# Patient Record
Sex: Female | Born: 1935 | Race: White | Hispanic: No | Marital: Married | Smoking: Former smoker
Health system: Southern US, Community
[De-identification: ages and names within clinical notes are randomized; demographics above are authoritative.]

## PROBLEM LIST (undated history)

## (undated) DIAGNOSIS — E11319 Type 2 diabetes mellitus with unspecified diabetic retinopathy without macular edema: Secondary | ICD-10-CM

## (undated) DIAGNOSIS — K219 Gastro-esophageal reflux disease without esophagitis: Secondary | ICD-10-CM

## (undated) DIAGNOSIS — K649 Unspecified hemorrhoids: Secondary | ICD-10-CM

## (undated) DIAGNOSIS — R4182 Altered mental status, unspecified: Secondary | ICD-10-CM

## (undated) DIAGNOSIS — I619 Nontraumatic intracerebral hemorrhage, unspecified: Secondary | ICD-10-CM

## (undated) DIAGNOSIS — M6281 Muscle weakness (generalized): Secondary | ICD-10-CM

## (undated) DIAGNOSIS — E119 Type 2 diabetes mellitus without complications: Secondary | ICD-10-CM

## (undated) DIAGNOSIS — R55 Syncope and collapse: Secondary | ICD-10-CM

## (undated) DIAGNOSIS — F0390 Unspecified dementia without behavioral disturbance: Secondary | ICD-10-CM

## (undated) DIAGNOSIS — G9341 Metabolic encephalopathy: Secondary | ICD-10-CM

## (undated) DIAGNOSIS — I10 Essential (primary) hypertension: Secondary | ICD-10-CM

## (undated) DIAGNOSIS — F329 Major depressive disorder, single episode, unspecified: Secondary | ICD-10-CM

## (undated) DIAGNOSIS — R410 Disorientation, unspecified: Secondary | ICD-10-CM

## (undated) DIAGNOSIS — I2510 Atherosclerotic heart disease of native coronary artery without angina pectoris: Secondary | ICD-10-CM

## (undated) DIAGNOSIS — I639 Cerebral infarction, unspecified: Secondary | ICD-10-CM

## (undated) DIAGNOSIS — H269 Unspecified cataract: Secondary | ICD-10-CM

## (undated) DIAGNOSIS — K5792 Diverticulitis of intestine, part unspecified, without perforation or abscess without bleeding: Secondary | ICD-10-CM

## (undated) DIAGNOSIS — E785 Hyperlipidemia, unspecified: Secondary | ICD-10-CM

## (undated) HISTORY — DX: Altered mental status, unspecified: R41.82

## (undated) HISTORY — DX: Unspecified dementia without behavioral disturbance: F03.90

## (undated) HISTORY — DX: Atherosclerotic heart disease of native coronary artery without angina pectoris: I25.10

## (undated) HISTORY — DX: Essential (primary) hypertension: I10

## (undated) HISTORY — PX: BREAST CYST EXCISION: SHX579

## (undated) HISTORY — DX: Gastro-esophageal reflux disease without esophagitis: K21.9

## (undated) HISTORY — DX: Type 2 diabetes mellitus without complications: E11.9

## (undated) HISTORY — DX: Muscle weakness (generalized): M62.81

## (undated) HISTORY — DX: Syncope and collapse: R55

## (undated) HISTORY — DX: Cerebral infarction, unspecified: I63.9

## (undated) HISTORY — DX: Unspecified hemorrhoids: K64.9

## (undated) HISTORY — DX: Nontraumatic intracerebral hemorrhage, unspecified: I61.9

## (undated) HISTORY — DX: Type 2 diabetes mellitus with unspecified diabetic retinopathy without macular edema: E11.319

## (undated) HISTORY — DX: Unspecified cataract: H26.9

## (undated) HISTORY — PX: CARDIAC CATHETERIZATION: SHX172

## (undated) HISTORY — DX: Unspecified dementia, unspecified severity, without behavioral disturbance, psychotic disturbance, mood disturbance, and anxiety: F03.90

## (undated) HISTORY — DX: Diverticulitis of intestine, part unspecified, without perforation or abscess without bleeding: K57.92

## (undated) HISTORY — DX: Major depressive disorder, single episode, unspecified: F32.9

## (undated) HISTORY — DX: Metabolic encephalopathy: G93.41

## (undated) HISTORY — DX: Disorientation, unspecified: R41.0

---

## 2003-06-08 ENCOUNTER — Encounter: Payer: Self-pay | Admitting: Family Medicine

## 2003-06-08 ENCOUNTER — Ambulatory Visit (HOSPITAL_COMMUNITY): Admission: RE | Admit: 2003-06-08 | Discharge: 2003-06-08 | Payer: Self-pay | Admitting: Family Medicine

## 2004-03-08 ENCOUNTER — Ambulatory Visit (HOSPITAL_COMMUNITY): Admission: RE | Admit: 2004-03-08 | Discharge: 2004-03-08 | Payer: Self-pay | Admitting: Internal Medicine

## 2005-01-23 ENCOUNTER — Ambulatory Visit (HOSPITAL_COMMUNITY): Admission: RE | Admit: 2005-01-23 | Discharge: 2005-01-23 | Payer: Self-pay | Admitting: Family Medicine

## 2005-10-09 ENCOUNTER — Inpatient Hospital Stay (HOSPITAL_COMMUNITY): Admission: EM | Admit: 2005-10-09 | Discharge: 2005-10-10 | Payer: Self-pay | Admitting: Emergency Medicine

## 2005-10-09 ENCOUNTER — Ambulatory Visit: Payer: Self-pay | Admitting: *Deleted

## 2010-01-11 ENCOUNTER — Ambulatory Visit (HOSPITAL_COMMUNITY): Admission: RE | Admit: 2010-01-11 | Discharge: 2010-01-11 | Payer: Self-pay | Admitting: Family Medicine

## 2010-11-05 ENCOUNTER — Encounter: Payer: Self-pay | Admitting: Cardiology

## 2010-11-05 ENCOUNTER — Encounter (INDEPENDENT_AMBULATORY_CARE_PROVIDER_SITE_OTHER): Payer: Self-pay | Admitting: *Deleted

## 2010-11-05 ENCOUNTER — Ambulatory Visit (INDEPENDENT_AMBULATORY_CARE_PROVIDER_SITE_OTHER): Payer: Medicare Other | Admitting: Cardiology

## 2010-11-05 DIAGNOSIS — R079 Chest pain, unspecified: Secondary | ICD-10-CM

## 2010-11-05 DIAGNOSIS — E782 Mixed hyperlipidemia: Secondary | ICD-10-CM

## 2010-11-05 DIAGNOSIS — I10 Essential (primary) hypertension: Secondary | ICD-10-CM

## 2010-11-05 DIAGNOSIS — K219 Gastro-esophageal reflux disease without esophagitis: Secondary | ICD-10-CM | POA: Insufficient documentation

## 2010-11-06 ENCOUNTER — Encounter: Payer: Self-pay | Admitting: Cardiology

## 2010-11-09 ENCOUNTER — Other Ambulatory Visit: Payer: Self-pay | Admitting: Cardiology

## 2010-11-13 NOTE — Assessment & Plan Note (Signed)
Summary: **NP6 CP Gerda Diss [phone]   Visit Type:  Initial Consult Primary Provider:  Dr.Stephen Gerda Diss   History of Present Illness: 77 year old woman referred for cardiology consultation. She reports a history of moderate chest pain suggestive of angina, specifically related to walking at a brisk pace, noted intermittently since around December 2011. This has not been present recently. She states that otherwise when she has been doing her typical senior aerobics 3 days a week, she has not had these symptoms.  She describes a pressure-like sensation, only noted with exertion, never at rest. No unusual shortness of breath over baseline, no palpitations, nausea, emesis, or syncope. She recalls having somewhat similar symptoms back in 2007 at which time she underwent a cardiac evaluation. Exercise Myoview at that point was reassuring.  She does state that with these symptoms, she has been treated with a proton pump inhibitor with improvement in the past. She was recently placed back on omeprazole.  Cardiac risk factors are noted below.  Current Medications (verified): 1)  Lescol 20 Mg Caps (Fluvastatin Sodium) .... Take 1 Tab Daily 2)  Glucosamine 500 Mg Caps (Glucosamine Sulfate) .... Take 1 Tab Two Times A Day 3)  Citracal Plus  Tabs (Multiple Minerals-Vitamins) .... Take 1 Tab Daily 4)  Centrum  Tabs (Multiple Vitamins-Minerals) .... Take 1 Tab Daily 5)  Vitamin C 500 Mg Tabs (Ascorbic Acid) .... Take 1 Tab Daily 6)  Nasonex 50 Mcg/act Susp (Mometasone Furoate) .... Use Prn 7)  Fish Oil 1000 Mg Caps (Omega-3 Fatty Acids) .... Take 1 Tab Three Times A Day 8)  Co Q-10 150 Mg Caps (Coenzyme Q10) .... Take 1 Tab Daily 9)  Aspir-Low 81 Mg Tbec (Aspirin) .... Take 1 Tab Daily 10)  Calcium 500 Mg Tabs (Calcium) .... Take 1 Tab Two Times A Day 11)  Klor-Con 20 Meq Pack (Potassium Chloride) .... Take 1 Tab Daily 12)  Vitamin D3 3000 Unit Tabs (Cholecalciferol) .... Take Prn 13)  Dyazide 37.5-25 Mg  Caps (Triamterene-Hctz) .... Take 1 Tab Daily 14)  Vitamin B-12 1000 Mcg Tabs (Cyanocobalamin) .... Take 1 Tab Daily 15)  Advil 200 Mg Tabs (Ibuprofen) .... As Needed 16)  Pro-Biotic Blend  Caps (Probiotic Product) .... Take 1 Tab Daily 17)  Omeprazole 20 Mg Cpdr (Omeprazole) .... Take 1 Tab Daily 18)  Flagyl 500 Mg Tabs (Metronidazole) .... Take 1 Tab Three Times A Day  Allergies (verified): No Known Drug Allergies  Comments:  Nurse/Medical Assistant: patient brought meds she uses [facility] in [place]  Past History:  Family History: Last updated: 2010/11/26 Father: CAD in his 69s, died with MI in his 47s Mother: died age 39 with pancreatic cancer Siblings: sister with breast cancer  Social History: Last updated: 11/02/2010 Retired  Tobacco Use - No.  Alcohol Use - no Regular Exercise - no Drug Use - no  Past Medical History: Hypertension Diabetes Type 2 G E R D Cataracts Diverticulitis Allergic rhinitis  Past Surgical History: Breast cyst removed  Family History: Father: CAD in his 15s, died with MI in his 59s Mother: died age 44 with pancreatic cancer Siblings: sister with breast cancer  Review of Systems       The patient complains of chest pain.  The patient denies anorexia, fever, weight gain, syncope, dyspnea on exertion, peripheral edema, prolonged cough, hemoptysis, melena, hematochezia, and severe indigestion/heartburn.         Otherwise reviewed and negative except as outlined.  Vital Signs:  Patient profile:   77 year  old female Height:      63 inches Weight:      154 pounds BMI:     27.38 Pulse rate:   66 / minute BP sitting:   142 / 82  (left arm)  Vitals Entered By: Dreama Saa, CNA (November 05, 2010 2:53 PM)  Physical Exam  Additional Exam:  Normally nourished appearing elderly woman in no acute distress. HEENT: Conjunctiva and lids are normal, oropharynx with moist mucosa. Neck: Supple, no elevated JVP or bruits, or  thyromegaly. Lungs: Clear to auscultation, nonlabored. Cardiac: Regular rate and rhythm, no S3 or pericardial rub. Abdomen: Soft, nontender, bowel sounds present. Skin: Warm and dry. Musculoskeletal: No kyphosis. Extremities: No pitting edema. Neuropsychiatric: Alert and oriented x3, affect appropriate.   Nuclear Study  Procedure date:  10/10/2005  Findings:       RESULTS:  There is uniform perfusion throughout all myocardial   segments.  There is no evidence of ischemia or scar.  The overall   ejection fraction is 83%.  There are no wall motion abnormalities   seen.   STRESS TEST:  The patient exercised 4 minutes and 31 seconds of a   Bruce protocol attaining 6.5 mets of exercise.  Her heart rate   increased from 83 beats a minute to 150 beats a minute which is 100%   of her max predicted heart rate for her age.  Her blood pressure went   from 138/78 to 182/78.  During that time, she had shortness of breath   which resolved in recovery with no chest discomfort.  She had no   ischemic ST-T wave changes or arrhythmias seen and her baseline   electrocardiogram was interpretable.   IMPRESSION:   This is a low risk scan in a patient with no known coronary artery   disease.  Clinical correlation is advised.  EKG  Procedure date:  11/05/2010  Findings:      Sinus rhythm at 64 beats per minute.  Impression & Recommendations:  Problem # 1:  CHEST PAIN UNSPECIFIED (ICD-786.50)  Features concerning for angina as noted above. Cardiac risk factors include hypertension, type 2 diabetes mellitus, some family history. With similar symptoms in the past however, she underwent reassuring evaluation in 2007 it was ultimately felt to be more consistent with GERD. Resting ECG is normal. Plan at this point is to follow up with an exercise Myoview on medical therapy. Will then have her return to the office to discuss the results.  Her updated medication list for this problem includes:     Aspir-low 81 Mg Tbec (Aspirin) .Marland Kitchen... Take 1 tab daily  Orders: Nuclear Stress Test (Nuc Stress Test)  Problem # 2:  GERD (ICD-530.81)  Recently back on omeprazole.  Her updated medication list for this problem includes:    Omeprazole 20 Mg Cpdr (Omeprazole) .Marland Kitchen... Take 1 tab daily  Problem # 3:  ESSENTIAL HYPERTENSION, BENIGN (ICD-401.1)  Blood pressure elevated today. She reports compliance with her medications.  Her updated medication list for this problem includes:    Aspir-low 81 Mg Tbec (Aspirin) .Marland Kitchen... Take 1 tab daily    Dyazide 37.5-25 Mg Caps (Triamterene-hctz) .Marland Kitchen... Take 1 tab daily  Problem # 4:  MIXED HYPERLIPIDEMIA (ICD-272.2)  Followed by Dr. Gerda Diss.  Her updated medication list for this problem includes:    Lescol 20 Mg Caps (Fluvastatin sodium) .Marland Kitchen... Take 1 tab daily  Patient Instructions: 1)  Your physician recommends that you schedule a follow-up appointment in: 3-4 weeks  2)  Your physician has requested that you have an exercise stress myoview.  For further information please visit [email or web address]  Please follow instruction sheet, as given.

## 2010-11-13 NOTE — Letter (Signed)
Summary: [facility]  [facility]   Imported By: Faythe Ghee 11/06/2010 10:56:48  _____________________________________________________________________  External Attachment:    Type:   Image     Comment:   External Document

## 2010-11-13 NOTE — Letter (Signed)
Summary: [facility] (Nuc Med Stress)  [facility]  [address], [place] [address]   Phone: [phone]  Fax: [phone]    Nuclear Medicine 1-Day Stress Test Information Sheet  Re:     [name]   DOB:     March 01, 1934 MRN:     [id number] Weight:  Appointment Date: Register at: Appointment Time: Referring MD:  _x__Exercise Stress  __Adenosine   __Dobutamine  __Lexiscan  __Persantine   __Thallium  Urgency: ____1 (next day)   ____2 (one week)    ____3 (PRN)  Patient will receive Follow Up call with results: Patient needs follow-up appointment:  Instructions regarding medication:  How to prepare for your stress test: 1. DO NOT eat or dring 8 hours prior to your arrival time. This includes no caffeine (coffee, tea, sodas, chocolate) if you were instructed to take your medications, drink water with it. 2. DO NOT use any tobacco products for at leaset 8 hours prior to arrival. 3. DO NOT wear dresses or any clothing that may have metal clasps or buttons. 4. Wear short sleeve shirts, loose clothing, and comfortalbe walking shoes. 5. DO NOT use lotions, oils or powder on your chest before the test. 6. The test will take approximately 3-4 hours from the time you arrive until completion. 7. To register the day of the test, go to the Short Stay entrance at [facility]. 8. If you must cancel your test, call [phone] as soon as you are aware. 9.  DO NOT TAKE YOUR AM MEDICATION THE MORNING OF YOUR STRESS TEST After you arrive for test:   When you arrive at [facility] you will go to Short Stay to be registered. They will then send you to Radiology to check in. The Nuclear Medicine Tech will get you and start an IV in your arm or hand. A small amount of a radioactive tracer will then be injected into your IV. This tracer will then have to circulate for 30-45 minutes. During this time you will wait in the waiting room and you will be able  to drink something without caffeine. A series of pictures will be taken of your heart follwoing this waiting period. After the 1st set of pictures you will go to the stress lab to get ready for your stress test. During the stress test, another small amount of a radioactive tracer will be injected through your IV. When the stress test is complete, there is a short rest period while your heart rate and blood pressure will be monitored. When this monitoring period is complete you will have another set of pictrues taken. (The same as the 1st set of pictures). These pictures are taken between 15 minutes and 1 hour after the stress test. The time depends on the type of stress test you had. Your doctor will inform you of your test results within 7 days after test.    The possibilities of certain changes are possible during the test. They include abnormal blood pressure and disorders of the heart. Side effects of persantine or adenosine can include flushing, chest pain, shortness of breath, stomach tightness, headache and light-headedness. These side effects usually do not last long and are self-resolving. Every effort will be made to keep you comfortable and to minimize complications by obtaining a medical history and by close observation during the test. Emergency equipment, medications, and trained personnel are available to deal with any unusual situation which may arise.  Please notify  office at least 48 hours in advance if you are unable to keep this appt.

## 2010-11-15 ENCOUNTER — Encounter (HOSPITAL_COMMUNITY): Payer: Medicare Other

## 2010-11-15 ENCOUNTER — Ambulatory Visit (HOSPITAL_COMMUNITY)
Admission: RE | Admit: 2010-11-15 | Discharge: 2010-11-15 | Disposition: A | Discharge status: Home / Self Care | Payer: Medicare Other | Source: Ambulatory Visit | Attending: Cardiology | Admitting: Cardiology

## 2010-11-15 ENCOUNTER — Encounter: Payer: Self-pay | Admitting: Adult Health

## 2010-11-15 ENCOUNTER — Other Ambulatory Visit: Payer: Self-pay | Admitting: Cardiology

## 2010-11-15 ENCOUNTER — Ambulatory Visit (INDEPENDENT_AMBULATORY_CARE_PROVIDER_SITE_OTHER): Payer: Medicare Other | Admitting: Adult Health

## 2010-11-15 ENCOUNTER — Encounter (INDEPENDENT_AMBULATORY_CARE_PROVIDER_SITE_OTHER): Payer: Medicare Other

## 2010-11-15 ENCOUNTER — Encounter (HOSPITAL_COMMUNITY)
Admission: RE | Admit: 2010-11-15 | Discharge: 2010-11-15 | Disposition: A | Discharge status: Home / Self Care | Payer: Medicare Other | Source: Ambulatory Visit | Attending: Cardiology | Admitting: Cardiology

## 2010-11-15 ENCOUNTER — Encounter (HOSPITAL_COMMUNITY): Payer: Self-pay

## 2010-11-15 ENCOUNTER — Encounter: Payer: Self-pay | Admitting: Cardiology

## 2010-11-15 ENCOUNTER — Encounter: Payer: Self-pay | Admitting: *Deleted

## 2010-11-15 DIAGNOSIS — R079 Chest pain, unspecified: Secondary | ICD-10-CM | POA: Insufficient documentation

## 2010-11-15 DIAGNOSIS — E119 Type 2 diabetes mellitus without complications: Secondary | ICD-10-CM | POA: Insufficient documentation

## 2010-11-15 DIAGNOSIS — R0789 Other chest pain: Secondary | ICD-10-CM

## 2010-11-15 DIAGNOSIS — I10 Essential (primary) hypertension: Secondary | ICD-10-CM

## 2010-11-15 DIAGNOSIS — R072 Precordial pain: Secondary | ICD-10-CM

## 2010-11-15 DIAGNOSIS — R9439 Abnormal result of other cardiovascular function study: Secondary | ICD-10-CM

## 2010-11-15 LAB — CONVERTED CEMR LAB
Basophils Relative: 1 % (ref 0–1)
CO2: 28 meq/L (ref 19–32)
Chloride: 99 meq/L (ref 96–112)
Creatinine, Ser: 0.85 mg/dL (ref 0.40–1.20)
Eosinophils Relative: 2 % (ref 0–5)
HCT: 38.7 % (ref 36.0–46.0)
Hemoglobin: 13.6 g/dL (ref 12.0–15.0)
MCHC: 35.1 g/dL (ref 30.0–36.0)
MCV: 85.2 fL (ref 78.0–100.0)
Monocytes Absolute: 0.5 10*3/uL (ref 0.1–1.0)
Monocytes Relative: 10 % (ref 3–12)
Neutro Abs: 3.2 10*3/uL (ref 1.7–7.7)
RBC: 4.54 M/uL (ref 3.87–5.11)
aPTT: 32 s (ref 24–37)

## 2010-11-15 MED ORDER — TECHNETIUM TC 99M TETROFOSMIN IV KIT
10.0000 | PACK | Freq: Once | INTRAVENOUS | Status: AC | PRN
  Administered 2010-11-15: 9.7 via INTRAVENOUS

## 2010-11-15 MED ORDER — TECHNETIUM TC 99M TETROFOSMIN IV KIT
30.0000 | PACK | Freq: Once | INTRAVENOUS | Status: AC | PRN
  Administered 2010-11-15: 32.2 via INTRAVENOUS

## 2010-11-16 ENCOUNTER — Observation Stay (HOSPITAL_COMMUNITY)
Admission: RE | Admit: 2010-11-16 | Discharge: 2010-11-17 | Disposition: A | Discharge status: Home / Self Care | Payer: Medicare Other | Source: Ambulatory Visit | Attending: Cardiology | Admitting: Cardiology

## 2010-11-16 ENCOUNTER — Inpatient Hospital Stay (HOSPITAL_BASED_OUTPATIENT_CLINIC_OR_DEPARTMENT_OTHER)
Admission: RE | Admit: 2010-11-16 | Discharge: 2010-11-16 | Disposition: A | Discharge status: Hospice | Payer: Medicare Other | Source: Ambulatory Visit | Attending: Cardiology | Admitting: Cardiology

## 2010-11-16 DIAGNOSIS — I2510 Atherosclerotic heart disease of native coronary artery without angina pectoris: Secondary | ICD-10-CM

## 2010-11-16 DIAGNOSIS — E119 Type 2 diabetes mellitus without complications: Secondary | ICD-10-CM | POA: Insufficient documentation

## 2010-11-16 DIAGNOSIS — E785 Hyperlipidemia, unspecified: Secondary | ICD-10-CM | POA: Insufficient documentation

## 2010-11-16 DIAGNOSIS — I10 Essential (primary) hypertension: Secondary | ICD-10-CM | POA: Insufficient documentation

## 2010-11-16 DIAGNOSIS — I4729 Other ventricular tachycardia: Secondary | ICD-10-CM | POA: Insufficient documentation

## 2010-11-16 DIAGNOSIS — I472 Ventricular tachycardia, unspecified: Secondary | ICD-10-CM | POA: Insufficient documentation

## 2010-11-16 DIAGNOSIS — R9439 Abnormal result of other cardiovascular function study: Secondary | ICD-10-CM | POA: Insufficient documentation

## 2010-11-16 DIAGNOSIS — R079 Chest pain, unspecified: Secondary | ICD-10-CM | POA: Insufficient documentation

## 2010-11-16 DIAGNOSIS — I209 Angina pectoris, unspecified: Secondary | ICD-10-CM | POA: Insufficient documentation

## 2010-11-16 DIAGNOSIS — Z01810 Encounter for preprocedural cardiovascular examination: Secondary | ICD-10-CM | POA: Insufficient documentation

## 2010-11-16 HISTORY — PX: CAROTID STENT: SHX1301

## 2010-11-16 LAB — POCT ACTIVATED CLOTTING TIME: Activated Clotting Time: 464 seconds

## 2010-11-16 LAB — GLUCOSE, CAPILLARY: Glucose-Capillary: 93 mg/dL (ref 70–99)

## 2010-11-17 DIAGNOSIS — R079 Chest pain, unspecified: Secondary | ICD-10-CM

## 2010-11-17 LAB — BASIC METABOLIC PANEL
BUN: 13 mg/dL (ref 6–23)
CO2: 26 mEq/L (ref 19–32)
Calcium: 8.8 mg/dL (ref 8.4–10.5)
Creatinine, Ser: 1 mg/dL (ref 0.4–1.2)
GFR calc non Af Amer: 54 mL/min — ABNORMAL LOW (ref >60)

## 2010-11-17 LAB — CBC
Platelets: 226 10*3/uL (ref 150–400)
RDW: 12.7 % (ref 11.5–15.5)
WBC: 5.1 10*3/uL (ref 4.0–10.5)

## 2010-11-18 NOTE — Discharge Summary (Addendum)
[name], [name]          ACCOUNT NO.:  [id number]  MEDICAL RECORD NO.:  [id number]           PATIENT TYPE:  O  LOCATION:  6525                         FACILITY:  [facility]  PHYSICIAN:  Dayna Dunn, P.A.C.     DATE OF BIRTH:  06/07/1934  DATE OF ADMISSION:  11/16/2010 DATE OF DISCHARGE:  11/17/2010                              DISCHARGE SUMMARY   DISCHARGE DIAGNOSES: 1. Chest pain with abnormal nuclear stress test showing anterior wall     ischemia and nonsustained ventricular tachycardia. 2. Newly diagnosed coronary artery disease by catheterization on November 16, 2010, status post percutaneous transluminal coronary     angioplasty/Promus drug-eluting stent placement to the left     anterior descending.     a.     Residual 50% distal left main, proximal tandem 25% stenosis      of the circumflex, and obtuse marginal 30% proximal stenosis, and      nondominant right coronary artery with 99% mid stenosis.     b.     Ejection fraction 65% with normal wall motion by      catheterization on November 16, 2010. 3. Diet-controlled diabetes mellitus. 4. Hypertension. 5. Hyperlipidemia. 6. Non-sustained VT while undergoing initial nuclear stress test - for repeat GXT to rule out exercise-induced VT.  HOSPITAL COURSE:  [name] is a 77 year old female with a past medical history that includes diabetes, hypertension, and hyperlipidemia who was seen by Dr. Diona Browner on November 05, 2010, at the request of Dr. Gerda Diss for chest discomfort.  She was scheduled for stress Myoview secondary to multiple cardiac risk factors.  She came into this office for stress test today, supervised by Dr. Dietrich Pates.  She had poor exercise tolerance with significant 2-mm ST-segment depression laterally with 5 beats of asymptomatic V-tach.  Stress test was immediately stopped.  She fared well with normalization ST-segment without recurrent symptoms of arrhythmia.  She completed the stress portion of the  nuclear study.  She was seen initially by Dr. Broadus John to discuss the case with Dr. Dietrich Pates who felt that the patient would need cardiac catheterization given the abnormal stress test that reportedly showed anterior wall ischemia.  She subsequently was diagnosed with newly diagnosed coronary artery disease, and ultimately had PTCA/drug-eluting stent placement to the LAD.  Residual disease as noted above.  The patient tolerated the procedure well without problems.  She was seen and examined by Dr. Elease Hashimoto today and felt stable for discharge.  The patient was started on Toprol-XL 25 mg daily and had no further nonsustained VT on telemetry here in unit 6500.  She was also started on Balanta.  DISCHARGE LABORATORIES:  WBC 5.1, hemoglobin 11.9, hematocrit 34.8, platelet count 326.  Sodium 136, potassium 3.6, chloride 103, CO2 of 26, glucose 124, BUN 13, creatinine 1.0.  STUDIES:  Cardiac catheterization, November 16, 2010, both diagnostic and interventional, please see full report for details as well as HPI for summary.  DISCHARGE MEDICATIONS: 1. Metoprolol succinate 25 mg nightly. 2. Nitroglycerin sublingual 0.4 mg every 5 minutes as needed up to 3     doses for chest pain. 3. Ticagrelor 90  mg 1 tablet q.12 h. 4. Advil 200 mg daily as needed with note to the patient to only take     if needed as it can increase the risk of sudden bleeding while     taking aspirin and Balanta. 5. Aspirin 81 mg daily. 6. Calcium 500 mg b.i.d. 7. Citracal 1 tablet daily. 8. Co-Q 150 mg 1 tablet daily. 9. Fish oil 1000 mg t.i.d. 10.Glucosamine 500 mg b.i.d. 11.Lescol 20 mg daily. 12.Multivitamin 1 tablet daily. 13.Nasonex 1 spray nasally daily as needed. 14.Omeprazole 20 mg daily. 15.Potassium chloride 20 mEq daily. 16.Probiotica 1 OTC. 17.Triamterene and hydrochlorothiazide 37.5/25 mg daily. 18.Vitamin B12 one tablet daily. 19.Vitamin C 500 mg daily. 20.Vitamin D3 3000 units daily.  DISPOSITION:   [name] will be discharged in stable condition to home.  She is not to lift anything or participate in sexual activity for 1 week.  She is not to drive for 2 days.  She is to follow a heart- healthy low-sodium diabetic diet and if she notices any pain, swelling, bleeding, or pus at the cath site, she is to call or return.  She will follow with Dr. Diona Browner in approximately 2 weeks.  Our office will call her with this appointment. Our office will also call to schedule a stress test to rule out exercise-induced VT.  DURATION OF DISCHARGE ENCOUNTER:  Greater than 30 minutes including physician and PA time.     Dayna Dunn, P.A.C.     DD/MEDQ  D:  11/17/2010  T:  11/18/2010  Job:  [id number]  cc:   Jonelle Sidle, MD Donna Bernard, M.D.  Electronically Signed by Ronie Spies  on 11/18/2010 01:37:55 PM Electronically Signed by Rollene Rotunda MD [facility] on 12/28/2010 11:42:22 AM

## 2010-11-18 NOTE — Discharge Summary (Addendum)
  NAMELAVELLE, BERLAND          ACCOUNT NO.:  [id number]  MEDICAL RECORD NO.:  [id number]           PATIENT TYPE:  O  LOCATION:  6525                         FACILITY:  [facility]  PHYSICIAN:  Gerrit Friends. Dietrich Pates, MD, FACCDATE OF BIRTH:  July 22, 1934  DATE OF ADMISSION:  11/16/2010 DATE OF DISCHARGE:  11/17/2010                              DISCHARGE SUMMARY   ADDENDUM  The patient's pharmacy called [place] to let us know that there was no Brilinta in stock anywhere in [place] and therefore after discussion with Dr. Dietrich Pates, the patient was changed to Effient 10 mg p.o. daily. The pharmacy has been notified [facility]) and a prescription has been called in.     Dayna Dunn, P.A.C.   ______________________________ Gerrit Friends. Dietrich Pates, MD, [facility]    DD/MEDQ  D:  11/17/2010  T:  11/18/2010  Job:  [id number]  Electronically Signed by Ronie Spies  on 11/18/2010 01:37:10 PM Electronically Signed by Algoma Bing MD [facility] on 11/21/2010 06:43:58 PM

## 2010-11-20 ENCOUNTER — Inpatient Hospital Stay (HOSPITAL_COMMUNITY)
Admission: EM | Admit: 2010-11-20 | Discharge: 2010-11-21 | DRG: 312 | Disposition: A | Discharge status: Home / Self Care | Payer: Medicare Other | Attending: Cardiovascular Disease | Admitting: Cardiovascular Disease

## 2010-11-20 ENCOUNTER — Emergency Department (HOSPITAL_COMMUNITY): Payer: Medicare Other

## 2010-11-20 DIAGNOSIS — R55 Syncope and collapse: Secondary | ICD-10-CM

## 2010-11-20 DIAGNOSIS — E785 Hyperlipidemia, unspecified: Secondary | ICD-10-CM | POA: Diagnosis present

## 2010-11-20 DIAGNOSIS — R197 Diarrhea, unspecified: Secondary | ICD-10-CM | POA: Diagnosis present

## 2010-11-20 DIAGNOSIS — Z23 Encounter for immunization: Secondary | ICD-10-CM

## 2010-11-20 DIAGNOSIS — E119 Type 2 diabetes mellitus without complications: Secondary | ICD-10-CM | POA: Diagnosis present

## 2010-11-20 DIAGNOSIS — Z9861 Coronary angioplasty status: Secondary | ICD-10-CM

## 2010-11-20 DIAGNOSIS — I2510 Atherosclerotic heart disease of native coronary artery without angina pectoris: Secondary | ICD-10-CM | POA: Diagnosis present

## 2010-11-20 DIAGNOSIS — I10 Essential (primary) hypertension: Secondary | ICD-10-CM | POA: Diagnosis present

## 2010-11-20 DIAGNOSIS — Z7902 Long term (current) use of antithrombotics/antiplatelets: Secondary | ICD-10-CM

## 2010-11-20 DIAGNOSIS — Z79899 Other long term (current) drug therapy: Secondary | ICD-10-CM

## 2010-11-20 LAB — COMPREHENSIVE METABOLIC PANEL
AST: 42 U/L — ABNORMAL HIGH (ref 0–37)
Albumin: 3.9 g/dL (ref 3.5–5.2)
BUN: 11 mg/dL (ref 6–23)
GFR calc Af Amer: >60 mL/min (ref >60)
GFR calc non Af Amer: 59 mL/min — ABNORMAL LOW (ref >60)
Sodium: 137 mEq/L (ref 135–145)
Total Bilirubin: 0.8 mg/dL (ref 0.3–1.2)

## 2010-11-20 LAB — CBC
MCV: 87.4 fL (ref 78.0–100.0)
Platelets: 257 10*3/uL (ref 150–400)
RDW: 12.1 % (ref 11.5–15.5)
WBC: 7.8 10*3/uL (ref 4.0–10.5)

## 2010-11-20 LAB — CK TOTAL AND CKMB (NOT AT ARMC)
CK, MB: 3.5 ng/mL (ref 0.3–4.0)
Total CK: 174 U/L (ref 7–177)

## 2010-11-20 LAB — POCT CARDIAC MARKERS
CKMB, poc: 3.1 ng/mL (ref 1.0–8.0)
Myoglobin, poc: 144 ng/mL (ref 12–200)
Troponin i, poc: <0.05 ng/mL (ref 0.00–0.09)

## 2010-11-20 LAB — GLUCOSE, CAPILLARY: Glucose-Capillary: 113 mg/dL — ABNORMAL HIGH (ref 70–99)

## 2010-11-20 LAB — TROPONIN I: Troponin I: 0.02 ng/mL (ref 0.00–0.06)

## 2010-11-20 LAB — CARDIAC PANEL(CRET KIN+CKTOT+MB+TROPI): Relative Index: 2 (ref 0.0–2.5)

## 2010-11-20 NOTE — Assessment & Plan Note (Signed)
Summary: [facility]   Allergies: No Known Drug Allergies   Other Orders: T-Chest x-ray, 2 views (11914) T-Basic Metabolic Panel ([id number]) T-CBC w/Diff ([id number]) T-Protime, Auto ([id number]) T-PTT ([id number]) Cardiac Catheterization (Cardiac Cath)

## 2010-11-20 NOTE — Procedures (Signed)
[name], Proctor          ACCOUNT NO.:  [id number]  MEDICAL RECORD NO.:  [id number]           PATIENT TYPE:  O  LOCATION:  6525                         FACILITY:  [facility]  PHYSICIAN:  Veverly Fells. Excell Seltzer, MD  DATE OF BIRTH:  1934/05/18  DATE OF PROCEDURE:  11/16/2010 DATE OF DISCHARGE:                           CARDIAC CATHETERIZATION   PROCEDURE:  Percutaneous transluminal coronary angioplasty and stenting of the proximal left anterior descending artery.  PROCEDURAL INDICATIONS:  Proctor Proctor is a 77 year old diabetic woman who presented with class III angina.  She had a stress test that was very high risk.  She developed early onset significant ST depression and nonsustained ventricular tachycardia.  The stress test was not completed because of high-risk features.  She was referred for cardiac cath.  This was performed in the outpatient Cath Lab by Dr. Antoine Poche.  This demonstrated a left dominant circumflex, which was widely patent.  There was minimal left main disease.  There was severe ostial LAD stenosis that was in the 95-99% range, I could not tell by the angiograms whether there was a landing zone for a stent.  I have discussed the situation with the family and thought we should bring the patient on to the inpatient lab for consideration of PCI.  I planned on taking some more images and seen if this would be an appropriate lesion to treat percutaneously.  This was all discussed in detail and the patient was brought upstairs for PCI.  Risks and indications of procedure were reviewed with the patient and informed consent had been obtained prior to the diagnostic procedure. Right groin had an indwelling 4-French sheath.  This was changed out for a 6-French sheath over short wire.  Using sterile technique, a 6-French XB LAD 3.5-cm guide catheter was inserted.  The guide catheter appeared too long and selectively engage to the circumflex, it demonstrated that there was  a landing zone present in the proximal LAD.  There was fairly heavy calcification, but it was a discrete lesion.  Bivalirudin was started.  The patient was given 180 mg bolus of Ticagrelor.  I changed out to a shorter guide catheter.  An XB LAD 3.0-cm guide was chosen. This guide fit the LAD well.  I tried to wire the lesion with a Cougar wire, but I was unable to get a Cougar across the lesion.  I changed out to a Whisper wire which successfully navigated the stenosis.  I then attempted to predilate the vessel with a 2.5 x 15-mm balloon, but it would not cross the lesion.  A 1.5 x 15 was then used to cross and it crossed successfully with a little bit of resistance.  The 1.5 balloon was dilated to 14 atmospheres and appeared well expanded.  The 2.5 balloon then crossed easily and it was dilated to 12 atmospheres. Angiography was performed following balloon dilatation to try to decide on stent length and stent position.  A 2.75 x 16-mm Promus drug-eluting stent was chosen.  It was carefully positioned and deployed at 12 atmospheres.  The stent was then postdilated with a 3.0 x 12-mm [place] which was taken to 16 and  then 18 atmospheres on total of three inflations.  There was good stent expansion and TIMI 3 flow.  There was no compromise of the left circumflex.  The patient tolerated the procedure well.  There was 0% residual stenosis.  FINAL CONCLUSIONS:  Successful percutaneous intervention of the proximal LAD with a Promus drug-eluting stent.  The 95-99% stenosis was reduced to 0.  There was TIMI 3 flow pre and post, recommend dual antiplatelet therapy with aspirin and Ticagrelor for minimum of 12 months and preferably long-term if she is able to tolerate because of the ostial location of her stent.     Veverly Fells. Excell Seltzer, MD     MDC/MEDQ  D:  11/16/2010  T:  11/17/2010  Job:  [id number]  cc:   Bettey Mare. Lyman Bishop, NP Donna Bernard, M.D. Gerrit Friends. Dietrich Pates, MD,  [facility]  Electronically Signed by Tonny Bollman MD on 11/20/2010 06:03:57 PM

## 2010-11-20 NOTE — Letter (Signed)
Summary: Cardiac Catheterization Instructions- JV Lab  [facility] [place]  [address] [place] [address]   Phone: [phone]  Fax: [phone]     11/15/2010 MRN: [id number]  [name] [address] [place], [place]  [address]  [place]  Dear Ms. Odonnell,   You are scheduled for a Cardiac Catheterization on 11/16/2010 with Dr.Cooper  Please arrive to the 1st floor of the Heart and Vascular Center at [facility] at 11:30 am  on the day of your procedure. Please do not arrive before 6:30 a.m. Call the Heart and Vascular Center at [phone] if you are unable to make your appointmnet. The Code to get into the parking garage under the building is 3000. Take the elevators to the 1st floor. You must have someone to drive you home. Someone must be with you for the first 24 hours after you arrive home. Please wear clothes that are easy to get on and off and wear slip-on shoes. Do not eat or drink after midnight except water with your medications that morning. Bring all your medications and current insurance cards with you.  ___ DO NOT take these medications before your procedure: glucosamine  _x__ Make sure you take your aspirin.  _x__ You may take ALL of your medications with water that morning. ________________________________________________________________________________________________________________________________  ___ DO NOT take ANY medications before your procedure.  ___ Pre-med instructions:  ________________________________________________________________________________________________________________________________  The usual length of stay after your procedure is 2 to 3 hours. This can vary.  If you have any questions, please call the office at the number listed above.   Teressa Lower RN

## 2010-11-20 NOTE — Assessment & Plan Note (Signed)
Summary: per tammy/tmj   Primary Provider:  Dr.Stephen Gerda Diss   History of Present Illness: Mr. Kopecky is a 77 y/o CF orginally seen by Dr. Diona Browner on consultation at the request of Dr. Gerda Diss for chest discomfort.  Per his assessement she was scheduled for a stress myoview seoncdary to multiple CVRFs to include diabetes, hypertension.  She came today for stress test supervised by Dr.Rothber Rothbart. She has poor exercise tolerance with significant 2mm ST segment depression inferior/laterally.  She also had a 5 beats of V-tach which was asymptomatic for her.  Stress test was immediatley stopped.  She recovered well with normalization of ST segment and withou recurrence of ventricular arrythmia.  She completed the stress portion of her nuclear study and has come back to the office to discuss need to have cardiac catherization.  The myoview results are pending at the time of this office visit.  Allergies: No Known Drug Allergies  Past History:  Past medical, surgical, family and social histories (including risk factors) reviewed, and no changes noted (except as noted below).  Past Medical History: Reviewed history from 11/05/2010 and no changes required. Hypertension Diabetes Type 2 G E R D Cataracts Diverticulitis Allergic rhinitis  Past Surgical History: Reviewed history from 11/05/2010 and no changes required. Breast cyst removed  Family History: Reviewed history from 11/05/2010 and no changes required. Father: CAD in his 24s, died with MI in his 72s Mother: died age 34 with pancreatic cancer Siblings: sister with breast cancer  Social History: Reviewed history from 11/02/2010 and no changes required. Retired  Tobacco Use - No.  Alcohol Use - no Regular Exercise - no Drug Use - no  Review of Systems       All other systems have been reviewed and are negative unless stated above.   Physical Exam  General:  Well developed, well nourished, in no acute  distress. Head:  normocephalic and atraumatic Eyes:  PERRLA/EOM intact; conjunctiva and lids normal. Chest Wall:  no deformities or breast masses noted Lungs:  Clear bilaterally to auscultation and percussion. Heart:  Non-displaced PMI, chest non-tender; regular rate and rhythm, S1, S2 without murmurs, rubs or gallops. Carotid upstroke normal, no bruit. Normal abdominal aortic size, no bruits. Femorals normal pulses, no bruits. Pedals normal pulses. No edema, no varicosities. Abdomen:  Bowel sounds positive; abdomen soft and non-tender without masses, organomegaly, or hernias noted. No hepatosplenomegaly. Msk:  Back normal, normal gait. Muscle strength and tone normal. Pulses:  pulses normal in all 4 extremities Extremities:  No clubbing or cyanosis. Neurologic:  Alert and oriented x 3. Psych:  anxious.     EKG  Procedure date:  11/15/2010  Findings:      Normal sinus rhythm with rate of:  79 bpm  Impression & Recommendations:  Problem # 1:  ABNORMAL CV (STRESS) TEST (ICD-794.39) As stated above Mrs. Fogal had significant ST depression inferolaterally with asymptomatic Vtach.  Stress myoview as cancelled but stress images were obtained.  They will be read today by Dr. Dietrich Pates.  I have talked with her about the stress test and need to proceed with cardiac catherization.  Risks and benefits of the procedure along with possible outcomes have been discussed. She verablizes understanding and is willing to proceed.  She is advised not to drive or do exertional activity. She is planned for cardiac catherization in am.  Problem # 2:  CHEST PAIN UNSPECIFIED (ICD-786.50) Assessment: Unchanged  Her updated medication list for this problem includes:    Aspir-low 81  Mg Tbec (Aspirin) .Marland Kitchen... Take 1 tab daily  Problem # 3:  ESSENTIAL HYPERTENSION, BENIGN (ICD-401.1) Assessment: Unchanged  Her updated medication list for this problem includes:    Aspir-low 81 Mg Tbec (Aspirin) .Marland Kitchen... Take 1  tab daily    Dyazide 37.5-25 Mg Caps (Triamterene-hctz) .Marland Kitchen... Take 1 tab daily

## 2010-11-21 LAB — CBC
HCT: 37.2 % (ref 36.0–46.0)
Hemoglobin: 12.7 g/dL (ref 12.0–15.0)
MCV: 87.3 fL (ref 78.0–100.0)
WBC: 4.7 10*3/uL (ref 4.0–10.5)

## 2010-11-21 LAB — CARDIAC PANEL(CRET KIN+CKTOT+MB+TROPI)
CK, MB: 2.3 ng/mL (ref 0.3–4.0)
Relative Index: 2 (ref 0.0–2.5)
Total CK: 117 U/L (ref 7–177)

## 2010-11-21 LAB — COMPREHENSIVE METABOLIC PANEL
ALT: 38 U/L — ABNORMAL HIGH (ref 0–35)
AST: 30 U/L (ref 0–37)
Albumin: 3.4 g/dL — ABNORMAL LOW (ref 3.5–5.2)
Calcium: 9 mg/dL (ref 8.4–10.5)
GFR calc Af Amer: >60 mL/min (ref >60)
Potassium: 3.8 mEq/L (ref 3.5–5.1)
Sodium: 136 mEq/L (ref 135–145)
Total Protein: 6.1 g/dL (ref 6.0–8.3)

## 2010-11-21 LAB — GLUCOSE, CAPILLARY: Glucose-Capillary: 129 mg/dL — ABNORMAL HIGH (ref 70–99)

## 2010-12-11 NOTE — H&P (Signed)
NAMEDONTA, [name]          ACCOUNT NO.:  [id number]  MEDICAL RECORD NO.:  [id number]           PATIENT TYPE:  I  LOCATION:  3741                         FACILITY:  [facility]  PHYSICIAN:  Verne Carrow, MDDATE OF BIRTH:  1934/05/10  DATE OF ADMISSION:  11/20/2010 DATE OF DISCHARGE:                             HISTORY & PHYSICAL   PRIMARY CARDIOLOGIST:  Jonelle Sidle, MD, in [place].  PRIMARY CARE PROVIDER:  Donna Bernard, MD  PATIENT PROFILE:  This is 77 year old female with history of CAD, status post recent LAD drug-eluting stent placement who presents with syncope.  PROBLEM LIST: 1. Syncope. 2. Coronary artery disease.     a.     In March 2012, exercise Myoview revealing mild septal      ischemia.  The patient has 5 beats of nonsustained ventricular      tachycardia during exercise.     b.     On November 16, 2010, cardiac catheterization, left main 50%.     LAD 99% ostial.  Left circumflex was dominant with 25% proximal      stenosis.  OM-1 30%.  LPL normal.  LPDA normal.  RCA nondominant      with a 99% mid stenosis.  EF was 65%.  The LAD was successfully      stented with a 2.75 x 60 mm Promus drug-eluting stent. 3. Hypertension. 4. Hyperlipidemia. 5. Diabetes mellitus. 6. History of nonsustained ventricular tachycardia during exercise     Myoview. 7. Diverticulitis, status post antibiotic therapy approximately 2     weeks ago.  ALLERGIES:  NITROFURANTOIN.  HISTORY OF PRESENT ILLNESS:  This is a 77 year old female with recent onset of chest pain followed by abnormal Myoview with 2-mm lateral ST- segment depression of 5 beats of nonsustained VT along with mild septal ischemia.  The patient underwent catheterization on November 16, 2010, showing a 99% ostial stenosis in the LAD as well as a 99% mid stenosis in a nondominant right coronary artery.  The LAD was successfully stented with a Promus drug-eluting stent.  The patient was discharged home on  November 17, 2010 on aspirin and ticagrelor therapy, but her local pharmacy did not have any ticagrelor and this was subsequently switched to Effient 10 mg daily which she has been taking.  This morning, the patient had some mild GI upset and had a total of 4 bowel movements while at home.  During her fourth bowel movement, while on the toilet, she became diaphoretic and nauseated.  She was fairly weak and washed out.  When she was done on the bathroom, she went into her dining room where she was sitting on a chair with her head between her knees because she continued to feel weak.  Her husband then heard a thump and she had fallen forward and was sprawled out down on the floor. She was responsive almost immediately.  The patient's husband says that she was answering questions appropriately within 15 seconds.  The patient does not remember falling.  When she came to, she had no injuries.  She continued to feel weak, washed out, and diaphoretic as well as mildly nauseated.  EMS was called.  Initial blood pressure upon EMS arrival was 100/60 with a heart rate of 60.  Her blood glucose was 106.  [facility].  Currently, she is feeling better and pressures in the 140s.  She is in sinus rhythm on the monitor without any ST-T changes.  Lab workup up to this point is unrevealing. She has had an additional loose stool here.  HOME MEDICATIONS: 1. Probiotic blend over-the-counter daily. 2. Nitroglycerin 0.4 mg sublingual pr; chest pain. 3. Toprol-XL 25 mg nightly. 4. Coenzyme Q10 150 mg over-the-counter daily. 5. Advil 200 mg 2-3 tablets q.8 h. p.r.n. 6. Calcium plus D 1 tablet daily. 7. Vitamin B12 one tablet daily. 8. Triamterene/HCTZ 37.5/25 mg daily. 9. Effient 10 mg daily. 10.Potassium chloride 20 mEq daily. 11.Omeprazole 20 mg daily. 12.Nasonex inhaler daily p.r.n. 13.Multivitamin daily. 14.Lescol 20 mg daily. 15.Fish oil 1000 mg b.i.d. 16.Aspirin 81 mg  daily.  FAMILY HISTORY:  Mother died of cancer.  Father died with history of stroke and MI.  SOCIAL HISTORY:  The patient lives in [place] with her husband.  She is retired.  She previously smoked here and there, but quit greater than 25 years ago.  She occasionally has a glass of wine, but none recently. She denies drug use.  She is not yet routinely exercising.  REVIEW OF SYSTEMS:  Positive for presyncope with syncope, diaphoresis, nausea, diarrhea, generalized weakness, and malaise.  She also has mild diffuse abdominal discomfort.  She is a full code.  Otherwise, all systems are reviewed and are negative.  PHYSICAL EXAMINATION:  VITAL SIGNS:  Temperature 97.5, heart rate 57, respirations 15, blood pressure 149/67, and pulse ox 100% on 2 liters. GENERAL:  A pleasant white female in no acute distress.  Awake, alert, and oriented x3.  She has a normal affect. HEENT:  Normal. NEURO:  Grossly intact and nonfocal. SKIN:  Warm and dry without lesions or masses. NECK:  Supple without bruits or JVD. LUNGS:  Respirations were regular and unlabored.  Clear to auscultation. CARDIAC:  Regular S1 and S2.  No S3, S4, or murmurs. ABDOMEN:  Round, soft, with diffuse mild tenderness.  No rebound.  Bowel sounds present x4. EXTREMITIES:  Warm, dry, and pink.  No clubbing or cyanosis.  No edema. Right groin which was previously used for PCI is mildly ecchymotic without bleeding, bruits, or hematoma.  Dorsalis pedis and posterior tibial pulses are 2+ and equal bilaterally.  Chest x-ray on November 20, 2010, shows no active cardiopulmonary disease. EKG shows sinus bradycardia, rate of 55, normal axis, no acute ST-T changes.  Hemoglobin 13.4, hematocrit 38.3, WBC 7.8, and platelets 257. Sodium 137, potassium 3.9, chloride 99, CO2 of 27, BUN 11, creatinine 0.93, and glucose 146 . AST 42, ALT 44, total protein 7.0, and albumin 3.9.  CK-MB 3.1 and troponin I less than 0.5.  Calcium  9.5.  ASSESSMENT/PLAN: 1. Syncope:  The patient with multiple bowel movements this morning     the last of which was a loose stool here in the ED.  The patient     has been feeling weak and lightheaded as well as diaphoretic and     did have a brief syncopal episode.  Blood pressure by EMS on     arrival was 160 and is now normalized.  I suspect vasovagal     possibly in the setting of GI illness.  She does report a recent     course of  antibiotics for possible diverticular flare and is still     mildly tender over her abdomen.  Plan to admit and follow telemetry     as she did have an episode of nonsustained VT on Myoview.  Hydrate. 2. Gastrointestinal illness:  As above.  Hydrate.  Guaiac stools.     P.r.n. Imodium. 3. Hypertension:  Continue home meds except diuretic. 4. Hyperlipidemia:  Continue statin. 5. Diabetes mellitus:  Add sliding scale insulin.  This is diet     controlled at home. 6. Coronary artery disease:  The patient denies chest pain or dyspnea.     We will check enzymes.  Continue aspirin, Effient, beta-blocker,     and statin.     Nicolasa Ducking, ANP   ______________________________ Verne Carrow, MD    CB/MEDQ  D:  11/20/2010  T:  11/21/2010  Job:  [id number] Electronically Signed by Nicolasa Ducking ANP on 12/11/2010 04:06:12 PM Electronically Signed by Verne Carrow MD on 12/11/2010 05:10:37 PM

## 2010-12-12 ENCOUNTER — Encounter: Payer: Self-pay | Admitting: Cardiology

## 2010-12-13 ENCOUNTER — Ambulatory Visit (INDEPENDENT_AMBULATORY_CARE_PROVIDER_SITE_OTHER): Payer: Medicare Other | Admitting: Cardiology

## 2010-12-13 ENCOUNTER — Encounter: Payer: Self-pay | Admitting: Cardiology

## 2010-12-13 VITALS — BP 189/75 | HR 68 | Ht 63.0 in | Wt 153.0 lb

## 2010-12-13 DIAGNOSIS — I2510 Atherosclerotic heart disease of native coronary artery without angina pectoris: Secondary | ICD-10-CM

## 2010-12-13 DIAGNOSIS — I10 Essential (primary) hypertension: Secondary | ICD-10-CM

## 2010-12-13 DIAGNOSIS — E782 Mixed hyperlipidemia: Secondary | ICD-10-CM

## 2010-12-13 MED ORDER — VITAMIN D3 75 MCG (3000 UT) PO TABS: 3000.0000 [IU] | ORAL_TABLET | ORAL | Status: DC | PRN

## 2010-12-13 MED ORDER — VITAMIN C 500 MG PO TABS: 500.0000 mg | ORAL_TABLET | ORAL | Status: DC | PRN

## 2010-12-13 MED ORDER — RAMIPRIL 5 MG PO CAPS: 5.0000 mg | ORAL_CAPSULE | Freq: Every day | ORAL | Status: DC

## 2010-12-13 NOTE — Assessment & Plan Note (Signed)
Continue statin therapy. Will plan on followup fasting lipid profile and liver function tests. Ideally LDL should be close to 70.

## 2010-12-13 NOTE — Patient Instructions (Addendum)
**Note De-Identified  Obfuscation** Your physician recommends that you schedule a follow-up appointment in: 6 weeks Your physician has recommended you make the following change in your medication: start taking Altace 5mg  daily Your physician recommends that you return for lab work in: 2 weeks

## 2010-12-13 NOTE — Assessment & Plan Note (Signed)
Note that recent medication adjustments have been made. Blood pressure is not optimally controlled. We plan to initiate Altace 5 mg daily, with followup BMET over the next few weeks. She should continue to check blood pressure at home. She has a followup visit with her primary care provider soon as well. This can be titrated as tolerated.

## 2010-12-13 NOTE — Assessment & Plan Note (Addendum)
As outlined above, now status post DES to the LAD, with residual disease being managed medically. I discussed with her the importance of dual antiplatelet therapy, also encouraged her to continue regular exercise with plan for cardiac rehabilitation. She is not reporting any palpitations, tolerating beta blocker therapy, and LVEF is normal. Suspect that the brief VT noted on stress testing was ischemia induced. Indeed she had no sustained arrhythmias on telemetry monitoring in the hospital. We will followup on her progress over the next 6 weeks.

## 2010-12-13 NOTE — Progress Notes (Addendum)
**Note De-Identified Proctor Obfuscation** Clinical Summary [name] is a 77 y.o.female presenting for followup. I saw her in early March and arranged followup stress testing with symptoms concerning for angina. Myoview was significantly abnormal, resulting in cardiac catheterization and diagnosis of CAD. Patient underwent DES to the LAD and otherwise was managed medically. She had a subsequent hospital admission related to neurocardiogenic syncope, and medication adjustments were made.  In reviewing the discharge summaries, there is some mention of a followup exercise test to exclude exercise-induced VT, although it seems that this brief event was most likely related to ischemia. No significant arrhythmias were noted on hospital telemetry. She reports doing well with no palpitations or dizziness.  She has been walking 15 minutes twice a day, planning to start cardiac rehabilitation later this month. She seems very motivated to get back to a regular exercise regimen.  Interestingly she brought in a letter, generated by our office with electronic medical record, indicating that her stress test was normal. She received this letter after she had already been seen in the office on the same day as her abnormal stress test for review of the result and to be set up for cardiac catheterization. I spoke with nursing today, and found that this occurred during the recent transition of our electronic medical records, sent in error by [name]. She recognized the error and apologized to the patient today who voiced appreciation and understanding in the explanation.  Recent lab work from March showed hemoglobin 12.7, potassium 3.8, BUN 8, creatinine 0.8.  Blood pressure is elevated today, has also been elevated at home based on her checks. We discussed initiating an ACE inhibitor for better blood pressure control.   Allergies  Allergen Reactions  . Macrodantin     Current outpatient prescriptions:Ascorbic Acid (VITAMIN C) 500 MG tablet, Take 1  tablet (500 mg total) by mouth as needed., Disp: 30 tablet, Rfl: 3;  aspirin 81 MG tablet, Take 81 mg by mouth daily.  , Disp: , Rfl: ;  calcium citrate-vitamin D (CITRACAL+D) 315-200 MG-UNIT per tablet, Take 1 tablet by mouth daily.  , Disp: , Rfl: ;  Cholecalciferol (VITAMIN D3) 3000 UNITS TABS, Take 3,000 Units by mouth as needed., Disp: 30 tablet, Rfl: 3 Coenzyme Q10 150 MG CAPS, Take 150 mg by mouth daily.  , Disp: , Rfl: ;  fluvastatin (LESCOL) 20 MG capsule, Take 20 mg by mouth daily. , Disp: , Rfl: ;  metoprolol succinate (TOPROL-XL) 25 MG 24 hr tablet, Take 25 mg by mouth daily.  , Disp: , Rfl: ;  mometasone (NASONEX) 50 MCG/ACT nasal spray, 2 sprays by Nasal route as needed.  , Disp: , Rfl: ;  Multiple Vitamins-Minerals (CENTRUM) tablet, Take 1 tablet by mouth daily.  , Disp: , Rfl:  nitroGLYCERIN (NITROSTAT) 0.4 MG SL tablet, Place 0.4 mg under the tongue every 5 (five) minutes as needed.  , Disp: , Rfl: ;  Omega-3 Fatty Acids (FISH OIL) 1000 MG CAPS, Take 1,000 mg by mouth 3 (three) times daily.  , Disp: , Rfl: ;  omeprazole (PRILOSEC) 20 MG capsule, Take 20 mg by mouth daily.  , Disp: , Rfl: ;  prasugrel (EFFIENT) 10 MG TABS, Take 10 mg by mouth daily.  , Disp: , Rfl:  Probiotic Product (PRO-BIOTIC BLEND) CAPS, Take by mouth daily.  , Disp: , Rfl: ;  vitamin B-12 (CYANOCOBALAMIN) 1000 MCG tablet, Take 1,000 mcg by mouth daily.  , Disp: , Rfl: ;  DISCONTD: Ascorbic Acid (VITAMIN C) 500 MG tablet,  Take 500 mg by mouth daily.  , Disp: , Rfl: ;  DISCONTD: calcium carbonate (TUMS) 500 MG chewable tablet, Chew 2 tablets by mouth 2 (two) times daily.  , Disp: , Rfl:  DISCONTD: Cholecalciferol (VITAMIN D3) 3000 UNITS TABS, Take 3,000 Units by mouth as needed.  , Disp: , Rfl: ;  DISCONTD: Ticagrelor 90 MG TABS, Take 1 capsule by mouth daily.  , Disp: , Rfl: ;  metroNIDAZOLE (FLAGYL) 500 MG tablet, Take 500 mg by mouth 3 (three) times daily.  , Disp: , Rfl: ;  ramipril (ALTACE) 5 MG capsule, Take 1 capsule (5  mg total) by mouth at bedtime., Disp: 30 capsule, Rfl: 6 DISCONTD: Glucosamine 500 MG CAPS, Take 500 mg by mouth 2 (two) times daily.  , Disp: , Rfl: ;  DISCONTD: ibuprofen (ADVIL,MOTRIN) 200 MG tablet, Take 200 mg by mouth as needed.  , Disp: , Rfl: ;  DISCONTD: potassium chloride SA (K-DUR,KLOR-CON) 20 MEQ tablet, Take 20 mEq by mouth daily.  , Disp: , Rfl: ;  DISCONTD: triamterene-hydrochlorothiazide (DYAZIDE) 37.5-25 MG per capsule, Take 1 capsule by mouth daily.  , Disp: , Rfl:   Past Medical History  Diagnosis Date  . Essential hypertension, benign   . Type 2 diabetes mellitus   . GERD (gastroesophageal reflux disease)   . Cataracts, bilateral   . Diverticulitis   . Allergic rhinitis   . Syncope     Neurally mediated  . Coronary atherosclerosis of native coronary artery     DES LAD 3/12, 99% nondominant RCA, NOCAD otherwise, LVEF 65%    Social History [name] reports that she has never smoked. She has never used smokeless tobacco. [name] reports that she does not drink alcohol.  Review of Systems Some increasing problems with arthritis. Otherwise reviewed and negative except as outlined.  Physical Examination Filed Vitals:   12/13/10 1407  BP: 189/75  Pulse: 68  Normally nourished appearing elderly woman in no acute distress. HEENT: Conjunctiva and lids are normal, oropharynx with moist mucosa. Neck: Supple, no elevated JVP or bruits, or thyromegaly. Lungs: Clear to auscultation, nonlabored. Cardiac: Regular rate and rhythm, no S3 or pericardial rub. Abdomen: Soft, nontender, bowel sounds present. Skin: Warm and dry. Musculoskeletal: No kyphosis. Extremities: No pitting edema. Neuropsychiatric: Alert and oriented x3, affect appropriate.   Studies Cardiac catheterization 11/16/2010: Coronaries:  There was distal calcification in the left main with 50%   stenosis.  The LAD had long proximal stenosis.  There was ostial 99%   lesion.  There was mid long 25%  stenosis.  The mid diagonal was moderate   sized with luminal irregularities.  There was a very tiny ramus   intermediate.  Circumflex was a dominant vessel.  There was proximal   tandem 25% stenosis.  First obtuse marginal had proximal 30% stenosis.   Posterolateral x2 were small and normal.  PDA was small and normal.  The   right coronary artery was nondominant with 99% mid stenosis.      Left ventriculogram:  The left ventriculogram was obtained in the RAO   projection.  The EF was 65% with normal wall motion.  Problem List and Plan

## 2010-12-18 ENCOUNTER — Emergency Department (HOSPITAL_COMMUNITY): Payer: Medicare Other

## 2010-12-18 ENCOUNTER — Inpatient Hospital Stay (HOSPITAL_COMMUNITY)
Admission: EM | Admit: 2010-12-18 | Discharge: 2010-12-19 | Disposition: A | Discharge status: Other Acute Inpatient Hospital | Payer: Medicare Other | Source: Home / Self Care

## 2010-12-18 ENCOUNTER — Inpatient Hospital Stay (HOSPITAL_COMMUNITY): Payer: Medicare Other

## 2010-12-18 DIAGNOSIS — Z7982 Long term (current) use of aspirin: Secondary | ICD-10-CM

## 2010-12-18 DIAGNOSIS — I10 Essential (primary) hypertension: Secondary | ICD-10-CM | POA: Diagnosis present

## 2010-12-18 DIAGNOSIS — I619 Nontraumatic intracerebral hemorrhage, unspecified: Secondary | ICD-10-CM | POA: Diagnosis present

## 2010-12-18 DIAGNOSIS — Z7902 Long term (current) use of antithrombotics/antiplatelets: Secondary | ICD-10-CM

## 2010-12-18 DIAGNOSIS — R55 Syncope and collapse: Secondary | ICD-10-CM

## 2010-12-18 DIAGNOSIS — E119 Type 2 diabetes mellitus without complications: Secondary | ICD-10-CM | POA: Diagnosis present

## 2010-12-18 DIAGNOSIS — Z9861 Coronary angioplasty status: Secondary | ICD-10-CM

## 2010-12-18 DIAGNOSIS — I2510 Atherosclerotic heart disease of native coronary artery without angina pectoris: Secondary | ICD-10-CM | POA: Diagnosis present

## 2010-12-18 DIAGNOSIS — I498 Other specified cardiac arrhythmias: Secondary | ICD-10-CM | POA: Diagnosis present

## 2010-12-18 DIAGNOSIS — E876 Hypokalemia: Secondary | ICD-10-CM | POA: Diagnosis present

## 2010-12-18 DIAGNOSIS — E785 Hyperlipidemia, unspecified: Secondary | ICD-10-CM | POA: Diagnosis present

## 2010-12-18 DIAGNOSIS — Z87891 Personal history of nicotine dependence: Secondary | ICD-10-CM

## 2010-12-18 DIAGNOSIS — Z79899 Other long term (current) drug therapy: Secondary | ICD-10-CM

## 2010-12-18 LAB — URINALYSIS, ROUTINE W REFLEX MICROSCOPIC
Ketones, ur: NEGATIVE mg/dL
Nitrite: NEGATIVE
Specific Gravity, Urine: <1.005 — ABNORMAL LOW (ref 1.005–1.030)
pH: 7 (ref 5.0–8.0)

## 2010-12-18 LAB — BASIC METABOLIC PANEL
GFR calc non Af Amer: >60 mL/min (ref >60)
Potassium: 4 mEq/L (ref 3.5–5.1)
Sodium: 136 mEq/L (ref 135–145)

## 2010-12-18 LAB — CARDIAC PANEL(CRET KIN+CKTOT+MB+TROPI)
Relative Index: 1.7 (ref 0.0–2.5)
Troponin I: 0.02 ng/mL (ref 0.00–0.06)

## 2010-12-18 LAB — DIFFERENTIAL
Eosinophils Relative: 1 % (ref 0–5)
Lymphocytes Relative: 21 % (ref 12–46)
Lymphs Abs: 1.2 10*3/uL (ref 0.7–4.0)
Monocytes Absolute: 0.4 10*3/uL (ref 0.1–1.0)

## 2010-12-18 LAB — CBC
HCT: 39.8 % (ref 36.0–46.0)
MCHC: 34.7 g/dL (ref 30.0–36.0)
MCV: 88.8 fL (ref 78.0–100.0)
RDW: 12.3 % (ref 11.5–15.5)

## 2010-12-18 LAB — GLUCOSE, CAPILLARY: Glucose-Capillary: 142 mg/dL — ABNORMAL HIGH (ref 70–99)

## 2010-12-18 LAB — POCT CARDIAC MARKERS
CKMB, poc: 1 ng/mL (ref 1.0–8.0)
Troponin i, poc: <0.05 ng/mL (ref 0.00–0.09)

## 2010-12-18 NOTE — H&P (Signed)
[name], [name]          ACCOUNT NO.:  [id number]  MEDICAL RECORD NO.:  [id number]           PATIENT TYPE:  E  LOCATION:  APED                          FACILITY:  [facility]  PHYSICIAN:  Valetta Close, M.D.   DATE OF BIRTH:  Jan 04, 1934  DATE OF ADMISSION:  12/18/2010 DATE OF DISCHARGE:  LH                             HISTORY & PHYSICAL   CHIEF COMPLAINT:  Presyncope.  HISTORY OF PRESENT ILLNESS:  This is a 77 year old female with several recent admissions needing a cardiac cath in March 2012, having diverticulitis with diarrhea in 2012 and having an episode of presyncope secondary to hypotension with taking off her blood pressure medications back on November 21, 2010, who had been doing okay, went to a doctor's appointment on December 13, 2010, was put back on her blood pressure medications, she is unsure of which one, thinks it might have ended in "opril," but again she is unsure, had been doing okay doing that, but then last night decided to check her blood pressure for no specific reason and noted that it was quite high, it was in the 180s.  This made her quite anxious and she stayed awake all night checking her blood pressure finding that was still going up and were going to the 200s though, so she did not sleep well.  She woke up this morning feeling okay.  Her sugar was 117.  She had some breakfast and she went for a walk and during the walk, she developed generalized weakness and dizziness.  She also felt a little lightheaded.  She went back home,checked her blood pressure, and noted it was still high and because of the persistence of her high blood pressure, she went and saw Dr. Gerda Diss.  At Dr. Fletcher Anon office, she was weak and dizzy, although she never passed out.  Her sugar was 140.  She was weak, but she felt weak all over and because of these symptoms, she came to the hospital for admission.  PAST MEDICAL HISTORY:  Recent admission with chest pain November 20, 2010 to  November 21, 2010.  She had diverticulitis in March 2012.  Her diarrhea from that has resolved.  She had coronary artery disease, status post a PCI on November 15, 2010, she is on Effient for that.  Hypertension, diabetes, hyperlipidemia, and again the recent diverticulitis. Hemoglobin A1c is unknown.  She is a full code.  ALLERGIES:  She has an allergy to NITROFURANTOIN, it causes hives.  FAMILY HISTORY:  Notable for mother deceased of cancer and father deceased of a stroke and an MI.  SOCIAL HISTORY:  She lives in [place] with her husband who is present at bedside.  She quit smoking over 25 years ago, rare alcohol use.  No drug use.  Of note, during her prior admission when she came with presyncope, her blood pressure was 149/67, that was 100/60 in the field.  Of note, though she says she is a diabetic, I see no medications for diabetes on her medication list.  REVIEW OF SYSTEMS:  Complete detailed review of systems of all her organ system was performed and negative.  PHYSICAL EXAMINATION:  VITAL SIGNS:  Temperature 98.7, heart rate 54, blood pressure 206/71, O2 sat 100% on 2 L, respiratory rate 20. GENERAL:  She appears younger than her stated age and is in no apparent distress. SKIN:  Cool, dry, and unremarkable. HEENT:  She has no scleral icterus.  Her ENT exam is normal. LUNGS:  Clear to auscultation bilaterally. CARDIAC:  Bradycardic at around 47, with no murmurs. ABDOMEN:  Bowel sounds positive.  No tenderness, rebound, or guarding. EXTREMITIES:  No edema. NEUROLOGIC:  She has cranial nerves II through XII are intact, nonfocal. PSYCHIATRIC:  She is pleasant.  LABORATORY DATA:  White count 16, hemoglobin 14, hematocrit 40, platelets 223.  Sodium 136, potassium 4, chloride 101, bicarb 20, BUN 9, creatinine 0.88.  Glucose 137, calcium 9.8.  BNP is less than 30.  UA is negative.  MCV is 99.  MEDICATIONS: 1. Aspirin 81 by mouth once a day. 2. Calcium plus D 1 tablet once a  day. 3. Coenzyme Q10 150 once a day 4. Effient 10 mg by mouth once a day. 5. Fish oil 1000 mg by mouth once a day. 6. Lescol 20 mg by mouth once a day. 7. Toprol-XL 25 mg by mouth once a night. 8. Multivitamin by mouth once a day. 9. Nasonex one spray as needed. 10.Nitroglycerin 0.4 mg as needed for chest pain, she has not needed     this recently. 11.Omeprazole 20 mg by mouth once a day. 12.Probiotic over the counter 1 tablet once a day. 13.Vitamin B12 one tab by mouth once a day. 14.She had been on hydrochlorothiazide and triamterene in the recent     past and potassium but they have been held and again she was trying     to remember but she is unsure of which one.  ASSESSMENT AND PLAN: 1. Accelerated hypertension.  Her symptoms of dizziness, and weakness     in the setting of high blood pressure could be considered as     accelerated hypertension.  She has no other symptoms currently.  I     will treat her as if it is hypertensive urgency.  I am going to put     her on p.r.n. clonidine 0.1 every 6 hours as needed.  I am going to     resume her Maxzide.  I am going to decrease her Toprol as this may     be causing bradycardia, which may in fact be causing her symptoms,     so I am going to decrease that and watch her heart rate, may     consider amlodipine if her blood pressure remains elevated.  I am     going to hold her for an ACE inhibitor as well given that she had     been started on that medication recently and she got worse.  I     think it was an ACE inhibitor.  I am going to try to get that     medication list, our med rec is pending.  I will monitor on     telemetry and see how she does for the next 24 hours.  I will get a     CT scan of her head.  If her symptoms resolve with her blood     pressure being a little better, I will hold off on further imaging. 2. Coronary artery disease.  I will continue her Effient.  I will     decrease her Toprol-XL.  I will recheck a  lipid  panel.  I see that     she is on no statin.  If her LDL is greater than 70, I will put her     on a statin. 3. Bradycardic.  I will decrease the Toprol and monitor.  EKGs have     been negative by cardiac enzymes and an EKG in the morning. 4. Diabetes.  I am going to check an hemoglobin A1c.  I will put her     on a very light sliding scale really to see what her sugars are     like during the course of the day and then just monitor. 5. Presyncope.  It was more dizziness and weakness than anything else,     and again we will monitor on tele for at least 24 hours.  This admission was approximately 40 minutes.     Valetta Close, M.D.     JC/MEDQ  D:  12/18/2010  T:  12/18/2010  Job:  [id number]  cc:   Donna Bernard, M.D. Fax: [phone]  Jonelle Sidle, MD [phone] [address] [place], [place] [address]  Electronically Signed by Valetta Close M.D. on 12/18/2010 56:21:30 PM

## 2010-12-19 ENCOUNTER — Inpatient Hospital Stay (HOSPITAL_COMMUNITY): Payer: Medicare Other

## 2010-12-19 ENCOUNTER — Inpatient Hospital Stay (HOSPITAL_COMMUNITY)
Admission: AD | Admit: 2010-12-19 | Discharge: 2010-12-21 | DRG: 065 | Disposition: A | Discharge status: Home Health | Payer: Medicare Other | Source: Other Acute Inpatient Hospital | Attending: Internal Medicine | Admitting: Internal Medicine

## 2010-12-19 LAB — CARDIAC PANEL(CRET KIN+CKTOT+MB+TROPI)
CK, MB: 1.5 ng/mL (ref 0.3–4.0)
Relative Index: INVALID (ref 0.0–2.5)
Relative Index: INVALID (ref 0.0–2.5)
Troponin I: 0.01 ng/mL (ref 0.00–0.06)

## 2010-12-19 LAB — CBC
HCT: 38 % (ref 36.0–46.0)
MCH: 30.1 pg (ref 26.0–34.0)
MCHC: 33.4 g/dL (ref 30.0–36.0)
MCV: 90 fL (ref 78.0–100.0)
Platelets: 233 10*3/uL (ref 150–400)
RDW: 12.5 % (ref 11.5–15.5)
WBC: 5.3 10*3/uL (ref 4.0–10.5)

## 2010-12-19 LAB — GLUCOSE, CAPILLARY
Glucose-Capillary: 158 mg/dL — ABNORMAL HIGH (ref 70–99)
Glucose-Capillary: 164 mg/dL — ABNORMAL HIGH (ref 70–99)

## 2010-12-19 LAB — LIPID PANEL
LDL Cholesterol: 96 mg/dL (ref 0–99)
Total CHOL/HDL Ratio: 3.6 RATIO
VLDL: 32 mg/dL (ref 0–40)

## 2010-12-19 LAB — DIFFERENTIAL
Eosinophils Absolute: 0.2 10*3/uL (ref 0.0–0.7)
Eosinophils Relative: 4 % (ref 0–5)
Lymphocytes Relative: 30 % (ref 12–46)
Lymphs Abs: 1.6 10*3/uL (ref 0.7–4.0)
Monocytes Absolute: 0.5 10*3/uL (ref 0.1–1.0)
Monocytes Relative: 10 % (ref 3–12)

## 2010-12-19 LAB — BASIC METABOLIC PANEL
BUN: 10 mg/dL (ref 6–23)
CO2: 26 mEq/L (ref 19–32)
Chloride: 102 mEq/L (ref 96–112)
Creatinine, Ser: 0.81 mg/dL (ref 0.4–1.2)
Glucose, Bld: 119 mg/dL — ABNORMAL HIGH (ref 70–99)
Potassium: 3.3 mEq/L — ABNORMAL LOW (ref 3.5–5.1)

## 2010-12-19 LAB — MAGNESIUM: Magnesium: 1.9 mg/dL (ref 1.5–2.5)

## 2010-12-19 LAB — HEMOGLOBIN A1C: Hgb A1c MFr Bld: 6 % — ABNORMAL HIGH (ref <5.7)

## 2010-12-20 ENCOUNTER — Inpatient Hospital Stay (HOSPITAL_COMMUNITY): Payer: Medicare Other

## 2010-12-20 LAB — GLUCOSE, CAPILLARY
Glucose-Capillary: 129 mg/dL — ABNORMAL HIGH (ref 70–99)
Glucose-Capillary: 160 mg/dL — ABNORMAL HIGH (ref 70–99)
Glucose-Capillary: 167 mg/dL — ABNORMAL HIGH (ref 70–99)
Glucose-Capillary: 145 mg/dL — ABNORMAL HIGH (ref 70–99)

## 2010-12-20 LAB — CBC
HCT: 38.5 % (ref 36.0–46.0)
MCHC: 34 g/dL (ref 30.0–36.0)
MCV: 88.9 fL (ref 78.0–100.0)
Platelets: 248 10*3/uL (ref 150–400)
RDW: 12.6 % (ref 11.5–15.5)

## 2010-12-20 LAB — MAGNESIUM: Magnesium: 2 mg/dL (ref 1.5–2.5)

## 2010-12-20 LAB — BASIC METABOLIC PANEL
BUN: 14 mg/dL (ref 6–23)
Calcium: 9.4 mg/dL (ref 8.4–10.5)
GFR calc non Af Amer: 53 mL/min — ABNORMAL LOW (ref >60)
Glucose, Bld: 123 mg/dL — ABNORMAL HIGH (ref 70–99)
Sodium: 135 mEq/L (ref 135–145)

## 2010-12-20 LAB — LIPID PANEL: Cholesterol: 192 mg/dL (ref 0–200)

## 2010-12-20 LAB — PHOSPHORUS: Phosphorus: 4.8 mg/dL — ABNORMAL HIGH (ref 2.3–4.6)

## 2010-12-20 LAB — HEMOGLOBIN A1C: Mean Plasma Glucose: 126 mg/dL — ABNORMAL HIGH (ref <117)

## 2010-12-20 MED ORDER — IOHEXOL 350 MG/ML SOLN
50.0000 mL | Freq: Once | INTRAVENOUS | Status: AC | PRN
  Administered 2010-12-20: 50 mL via INTRAVENOUS

## 2010-12-21 LAB — GLUCOSE, CAPILLARY
Glucose-Capillary: 148 mg/dL — ABNORMAL HIGH (ref 70–99)
Glucose-Capillary: 98 mg/dL (ref 70–99)

## 2010-12-21 NOTE — Consult Note (Signed)
[name], [name]          ACCOUNT NO.:  [id number]  MEDICAL RECORD NO.:  [id number]           PATIENT TYPE:  I  LOCATION:  3012                         FACILITY:  [facility]  PHYSICIAN:  Levie Heritage, MD       DATE OF BIRTH:  Jun 04, 1934  DATE OF CONSULTATION:  12/20/2010 DATE OF DISCHARGE:                                CONSULTATION   REFERRING PHYSICIAN:  Hospitalist Team  REASON FOR CONSULTATION:  Intracranial hemorrhage.  CHIEF COMPLAINT:  Dizziness.  HISTORY OF PRESENT ILLNESS:  This patient is a 77 year old woman with multiple cardiovascular comorbidities who was in her usual state of health until Tuesday, December 18, 2010, when she started noticing sudden balance problem.  As per the patient, she would feel generalized weak and would tend to lean towards the left side while walking.  She denies any changes in her vision.  Denies any changes.  Denies any focal area of weakness or sensory abnormalities; however, she says that mainly dizziness feeling with the headache and walking with leaning towards the left side.  As per the family, on these days, her blood pressure has been in 170 ranges systolic.  She was taken to the [facility] where CT scan of the head was performed and revealed right basal ganglia hemorrhage in the thalamic area.  PAST MEDICAL HISTORY:  Coronary artery disease status post PCI stenting, hypertension, diabetes, hyperlipidemia, diverticulitis.  ALLERGIES:  She is allergic to NORTRIPTYLINE that causes dryness of the eyes.  FAMILY HISTORY:  Mother had cancer.  Father had stroke and MI.  SOCIAL HISTORY:  Lives in [place] with her husband who is at the bedside.  The patient quit smoking 45 years ago.  Rarely uses alcohol. There is no illicit drug abuse.  REVIEW OF SYSTEMS:  Currently, denies any chest pain, denies any shortness of breath.  Denies any problem with vision.  Denies any rashes.  Denies any fever, denies any burning urine.   Denies any nausea, vomiting, diarrhea.  Denies any pain in her joint.  Denies any fevers. Denies even headaches.  The only symptom remained from her recent onset deficit is the ataxia with balance problem while walking.  Rest of the 10-organ review of system unremarkable except those mentioned above.  REVIEW OF CLINICAL DATA:  I have reviewed her images and have noted the right thalamic bleed, which has been unchanged in the last 3 day's scan, the first one being on December 18, 2010 and the last one on this morning. I have also seen her lab results, noted HbA1c at 6, lipid profile is showing increased LDL at 108, mildly high glucose, BNP at 123.  Negative urinalysis for an infection and negative cardiac enzymes as well.  CURRENT LIST OF MEDICATIONS: 1. The patient takes captopril 12.5 mg 3 times a day. 2. She takes cyanocobalamin 1000 mcg on a daily basis. 3. She is on fluvastatin 20 mg at bedtime. 4. She is on insulin regimen. 5. She is on metoprolol 12.5 mg at bedtime. 6. She is on Maxzide 0.5 tablet on a daily basis. 7. She is on p.r.n. basis of Tylenol, Ventolin, hydralazine, and  Zofran. It is important to mention here that the patient was on aspirin until the day of her bleeding.  She was taking a baby aspirin 81 mg, which was stopped right away in the hospitalization at [facility].  PHYSICAL EXAMINATION:  VITAL SIGNS:  Blood pressure of 115/71 mmHg, pulse 61 per minute, temperature 97.8 degrees Fahrenheit . NEUROLOGIC:  The patient is awake, oriented x3, in no acute distress, pleasant affect, taking her food herself sitting at the bedside. There is no aphasia.  There is no dysarthria. Cranial nerves are intact to II through XII with reactive pupils. Intact visual fields.  No deficit of extraocular muscles.  Intact symmetrical face for sensation and strength.  Midline tongue without atrophy or fasciculation.  Shoulder shrug 5/5.  Midline uvula.  No elevation of palate,  symmetrical. Motor examination is 5/5 strength in all the muscles of upper and lower extremities proximal and distal muscle groups. SENSORY:  She feels to have light touch feeling intact in all four extremities and her face. GAIT:  While making her trying to stand up she felt dizzy again and had problem taking her steps with a tendency to fall towards the left side.  IMPRESSION:  This is a 77 year old woman with multiple comorbidities who had sudden balance problem with headache, and CT scan of the head has shown right thalamic hemorrhage, which has been unchanged in the last 3 days on the previous scans. My impression is hemorrhage result of the hypertension.  PLAN:  Given the history of the patient's PCI stenting, it is suggested to restart her on 81 mg aspirin from tomorrow morning.It can be increased to full dose (325mg ) if repeat CT head shows no changes in another couple of days. Please get the CT angiogram of the head and neck for vascular evaluation.   Please avoid any anticoagulants other than the  antiplatelet (aspirin) at least at this point.I have also counseled the patient and her family in detail at the bedside and have advised the importance of avoiding falls with the help of PT/OT rehab. I have also advised her to be compliant with her blood pressure medications. The goal blood pressure from now onwards should be normotension, and she needs a better blood pressure control. Please reffer her for F/U with Dr Marlis Edelson clinic on [place] by calling at [phone].         ______________________________ Levie Heritage, MD     WS/MEDQ  D:  12/20/2010  T:  12/21/2010  Job:  [id number]  Electronically Signed by Levie Heritage MD on 12/21/2010 10:24:52 AM

## 2010-12-24 LAB — GLUCOSE, CAPILLARY: Glucose-Capillary: 147 mg/dL — ABNORMAL HIGH (ref 70–99)

## 2010-12-25 ENCOUNTER — Encounter: Payer: Self-pay | Admitting: Cardiology

## 2010-12-28 NOTE — Cardiovascular Report (Signed)
  [name], [name]          ACCOUNT NO.:  [id number]  MEDICAL RECORD NO.:  [id number]           PATIENT TYPE:  O  LOCATION:  6525                         FACILITY:  [facility]  PHYSICIAN:  Rollene Rotunda, MD, FACCDATE OF BIRTH:  May 29, 1934  DATE OF PROCEDURE:  11/16/2010 DATE OF DISCHARGE:                           CARDIAC CATHETERIZATION   PRIMARY:  Donna Bernard, MD  CARDIOLOGIST:  Jonelle Sidle, MD  PROCEDURE:  Left heart catheterization/coronary arteriography.  SURGEON:  Rollene Rotunda, MD, [facility]  INDICATIONS:  Evaluate the patient with chest pain.  She had an abnormal stress perfusion study with anterior wall ischemia and ventricular tachycardia.  PROCEDURE NOTE:  Left heart catheterization was performed via the right femoral artery.  The artery was cannulated using the wall puncture and a #4-French arterial sheath was inserted via the Seldinger technique. Preformed Judkins and pigtail catheter were utilized.  The patient tolerated the tolerated well and left the lab in stable condition.  RESULTS:  Hemodynamics:  LV 145/12, AO 143/88.  Coronaries:  There was distal calcification in the left main with 50% stenosis.  The LAD had long proximal stenosis.  There was ostial 99% lesion.  There was mid long 25% stenosis.  The mid diagonal was moderate sized with luminal irregularities.  There was a very tiny ramus intermediate.  Circumflex was a dominant vessel.  There was proximal tandem 25% stenosis.  First obtuse marginal had proximal 30% stenosis. Posterolateral x2 were small and normal.  PDA was small and normal.  The right coronary artery was nondominant with 99% mid stenosis.  Left ventriculogram:  The left ventriculogram was obtained in the RAO projection.  The EF was 65% with normal wall motion.  CONCLUSION:  Severe single-vessel coronary disease.  PLAN:  PCI of the LAD.     Rollene Rotunda, MD, [facility]     JH/MEDQ  D:  11/16/2010  T:  11/17/2010   Job:  [id number]  cc:   Donna Bernard, M.D.  Electronically Signed by Rollene Rotunda MD [facility] on 12/28/2010 11:42:18 AM

## 2010-12-31 NOTE — Discharge Summary (Signed)
[name], [name]          ACCOUNT NO.:  [id number]  MEDICAL RECORD NO.:  [id number]           PATIENT TYPE:  I  LOCATION:  3741                         FACILITY:  [facility]  PHYSICIAN:  Doylene Canning. Ladona Ridgel, MD    DATE OF BIRTH:  09-18-1933  DATE OF ADMISSION:  11/20/2010 DATE OF DISCHARGE:  11/21/2010                              DISCHARGE SUMMARY   PRIMARY CARDIOLOGIST:  Jonelle Sidle, MD  PRIMARY CARE PROVIDER:  Donna Bernard, MD  DISCHARGE DIAGNOSIS:  Neurally mediated syncope.  SECONDARY DIAGNOSES: 1. Coronary artery disease, status post drug-eluting stent placement     to the LAD on November 15, 2010. 2. Hypertension. 3. Hyperlipidemia. 4. Diabetes mellitus. 5. History of diverticulitis, status post antibiotic therapy     approximately 2 weeks ago. 6. Intermittent diarrhea. 7. History of asymptomatic nonsustained V-tach during exercise Myoview     March 2012.  ALLERGIES:  NITROFURANTOIN.  PROCEDURES:  None.  HISTORY OF PRESENT ILLNESS:  A 77 year old female recently discharged from [facility] on November 17, 2010, following admission for unstable angina with abnormal Myoview and subsequent catheterization revealing a 99% ostial stenosis in the LAD which was successfully stented with a PROMUS drug-eluting stent.  Following discharge, the patient had no chest pain or dyspnea, but on the morning of November 20, 2010, she had 4 bowel movements all of which were apparently normal in consistency. After the four, she is feeling fairly weak and lightheaded as well as diaphoretic.  She went and sat in her kitchen with her head between her legs and experienced syncope.  This was witnessed by her husband.  She fell forward to the floor without sustaining injury and regained consciousness within 10-15 seconds.  Following regaining consciousness, she continued to feel weak and 911 was called.  Initial blood pressure taken by EMS was 100/60 with a heart rate of 60 and blood  glucose 106. She was taken to the [facility] ED for further evaluation.  In the ED, point-of-care markers were negative and ECG showed no acute ST-T changes.  Her blood pressure had improved into the 140-150 range and her heart rate was also stable.  She has improvement and weakness, but did have one loose stool while in the ED.  She was admitted for further evaluation.  HOSPITAL COURSE:  The patient ruled out for MI by cardiac markers.  She has had no recurrence of presyncope or syncope, and diarrhea has also cleared.  We suspect symptoms were secondary to neurally mediated syncope in the setting of possible GI illness.  The patient will be discharged to home today in good condition.  DISCHARGE LABS:  Hemoglobin 12.7, hematocrit 37.2, WBC 4.7, platelets 238,000.  Sodium 136, potassium 3.8, chloride 103, CO2 27, BUN 8, cretainine 0.84, glucose 124, total bilirubin 0.8, alkaline phosphatase 69, AST 30, ALT 38, total protein 6.1, albumin 3.4, calcium 9.0, CK 117, MB 2.3, troponin-I 0.01.  DISPOSITION:  The patient will be discharged to home today in good condition.  FOLLOWUP PLANS AND APPOINTMENTS: 1. The patient has follow up scheduled with Dr. Nona Dell on     December 13, 2010, at 2:20  p.m. 2. She is to follow up with Dr. Gerda Diss as previously scheduled.  DISCHARGE MEDICATIONS: 1. Aspirin 81 mg daily. 2. Calcium plus D 1 tab daily. 3. Co-enzyme Q10, 150  mg daily. 4. Effient 10 mg daily. 5. Fish oil 1000 mg b.i.d. 6. Lescol 20 mg daily. 7. Toprol XL 25 mg nightly. 8. Multivitamin 1 daily. 9. Nasonex 1 spray daily p.r.n. 10.Nitroglycerin 0.4 mg p.r.n. chest pain. 11.Omeprazole 20 mg daily. 12.Probiotic bland over-the-counter 1 tab daily. 13.Vitamin B12 one tablet daily. 14.We are currently holding the patient's triamterene and     hydrochlorothiazide and as such we are also holding her potassium.[facility] LABS AND STUDIES:  None.  DURATION DISCHARGE ENCOUNTER:   40 minutes including physician time.     Nicolasa Ducking, ANP   ______________________________ Doylene Canning. Ladona Ridgel, MD    CB/MEDQ  D:  11/21/2010  T:  11/22/2010  Job:  [id number]  cc:   Donna Bernard, M.D.  Electronically Signed by Nicolasa Ducking ANP on 12/11/2010 04:06:06 PM Electronically Signed by Lewayne Bunting MD on 12/31/2010 07:57:34 AM

## 2011-01-18 NOTE — Op Note (Signed)
NAME:  [name], [name]                    ACCOUNT NO.:  [id number]   MEDICAL RECORD NO.:  [id number]                   PATIENT TYPE:  AMB   LOCATION:  DAY                                  FACILITY:  [facility]   PHYSICIAN:  Lionel December, M.D.                 DATE OF BIRTH:  01/12/1934   DATE OF PROCEDURE:  03/08/2004  DATE OF DISCHARGE:                                 OPERATIVE REPORT   PROCEDURE:  Total colonoscopy.   ENDOSCOPIST:  Lionel December, M.D.   INDICATIONS:  Ms. Klinge is a 77 year old Caucasian female who is  undergoing screening colonoscopy.  Family history is negative for colorectal  carcinoma.  The procedure and risks were reviewed with the patient and  informed consent was obtained.   PREOPERATIVE MEDICATIONS:  Demerol 25 mg IV and Versed 4 mg IV.   FINDINGS:  Procedure performed in endoscopy suite.  The patient's vital  signs and O2 saturation were monitored during the procedure and remained  stable.  The patient was placed in the left lateral recumbent position and  rectal examination was performed.  No abnormality noted on external or  digital exam.   Olympus videoscope was placed in the rectum and advanced under vision into  the sigmoid colon which was somewhat tortuous. Preparation was excellent.  Scattered multiple diverticula were noted in the sigmoid colon and a few  above that.  Slowly and carefully the scope was advanced to the cecum which  was identified by appendiceal orifice and ileocecal valve.  Pictures were  taken for the record.  As the scope was withdrawn, the colonic mucosa was  carefully examined and there were no polyps and/or tumor masses.  The rectal  mucosal similarly was normal.   The scope was retroflexed to examine anorectal junction and moderate size  hemorrhoids were noted below the dentate line.  The endoscope was  straightened and withdrawn.  The patient tolerated the procedure well.   FINAL DIAGNOSES:  1. Pancolonic  diverticulosis; however, most of the diverticula are     concentrated in the sigmoid colon.  2. Moderate size external hemorrhoids.  3. No evidence of colonic polyps or other mucosal abnormalities.   RECOMMENDATIONS:  1. High fiber diet.  2. Citrucel 1 tablespoonful daily.  3. She should continue yearly Hemoccults and consider next screening exam in     10 years from now.      ___________________________________________                                            Lionel December, M.D.   NR/MEDQ  D:  03/08/2004  T:  03/08/2004  Job:  [id number]   cc:   Donna Bernard, M.D.  [address]  [place]  [place] [address]  Fax: [phone]

## 2011-01-18 NOTE — Discharge Summary (Signed)
[name], [name]          ACCOUNT NO.:  [id number]   MEDICAL RECORD NO.:  [id number]          PATIENT TYPE:  INP   LOCATION:  A220                          FACILITY:  [facility]   PHYSICIAN:  Donna Bernard, M.D.DATE OF BIRTH:  06-Oct-1933   DATE OF ADMISSION:  10/09/2005  DATE OF DISCHARGE:  02/08/2007LH                                 DISCHARGE SUMMARY   FINAL DIAGNOSES:  1.  Chest pain, myocardial infarction ruled out.  2.  Probable gastritis/esophagitis.  3.  Hypertension.  4.  Hiatal hernia.  5.  Hypokalemia.   DISPOSITION:  The patient was discharged to home.   DISCHARGE MEDICATIONS:  1.  K-Tab 20 mEq one daily add.  2.  Prilosec over-the-counter one b.i.d. x3 days then daily.  3.  Maintain other medications.  4.  Follow up in the office in one week.   HISTORY AND PHYSICAL:  For initial H&P, please see H&P as dictated.   HOSPITAL COURSE:  This patient is a 77 year old white female with history of  hypertension, type 2 diabetes, hyperlipidemia who presented to the office  with acute concerns.  The patient had substernal chest pain, quite severe in  nature, radiating to the back.  She did have a bit of tenderness in her  upper abdomen.  The patient was admitted to the hospital due to multiple  risk factors.  Serial cardiac enzymes were obtained.  These proved to be  negative.  The cardiac folks were consulted due to __________  symptomatology.  A CT was performed to rule out pulmonary embolus.  This  proved to be negative also.  The patient at the time of stress test was  okay.  It was felt that her symptoms were likely secondary to gastritis or  esophagitis.  Based on this, proton pump inhibitors were encouraged in long-  term fashion.  The patient was discharged home with diagnosis and  disposition as noted above.      Donna Bernard, M.D.  Electronically Signed     WSL/MEDQ  D:  11/07/2005  T:  11/08/2005  Job:  [id number]

## 2011-01-18 NOTE — Consult Note (Signed)
[name], SCHOENFELDER          ACCOUNT NO.:  [id number]   MEDICAL RECORD NO.:  [id number]          PATIENT TYPE:  INP   LOCATION:  A220                          FACILITY:  [facility]   PHYSICIAN:  Vida Roller, M.D.   DATE OF BIRTH:  06/21/1934   DATE OF CONSULTATION:  10/09/2005  DATE OF DISCHARGE:                                   CONSULTATION   CARDIOLOGY CONSULTATION:   PRIMARY CARE PHYSICIAN:  Dr. Lilyan Punt   DATE OF CONSULTATION:  October 09, 2005   HISTORY OF PRESENT ILLNESS:  Mrs. Knick is a 77 year old female with  cardiac risk factors of diabetes, hypertension and hyperlipidemia who  presents with 48 hours worth of waxing and waning substernal discomfort in  her chest.  She states that the discomfort started at night, awoken her from  sleep, it got better with some positional changes, recurred this morning,  she decided to go to her usual aerobics workout and completed that without  any trouble.  It did not get any worse with exertion.  She has no shortness  of breath or palpitations but the discomfort does radiate to her back.  It  is a fullness in the center of her chest not associated with any position or  pleuritic components.  She still has a very mild amount of it now after  three sublingual nitroglycerin.  She has a history of diabetes,  hyperlipidemia, hypertension, has a history of diverticulosis by colonoscopy  about 2 years ago.  No history of coronary artery disease.   MEDICATIONS:  She is on Diovan 25 and 37.5 once a day, Zyrtec 10 mg once a  day, Lescol 20 mg once a day, multivitamin once a day, and fish oil once a  day.  Here in the hospital they have added Lescol 20 mg once a day and  aspirin 325 once a day as well as nitroglycerin on an as-needed basis.   SOCIAL HISTORY:  She lives in [place] with her husband; she is married.  She does not smoke, drink, or use illicit drugs.  She does not have any more  than a 5 pack-year history of smoking  when she was very young.  She still is  very active.  She does aerobics three times a week without any trouble.   FAMILY HISTORY:  Her mother died of pancreatic cancer at age 52, father died  of a myocardial infarction in his 26s, had his first MI in his 17s, also had  a stroke.  She has one sister who has breast cancer who died in her 62s.   REVIEW OF SYSTEMS:  She denies any fever or chills, no headache, sinus  tenderness, no sinus discharge, no rashes or lesions.  No shortness of  breath, dyspnea on exertion, PND, orthopnea, lower extremity edema,  presyncope or claudication.  No urinary frequency or urgency, no dysuria.  No weakness, no numbness, no myalgias.  No nausea, vomiting, diarrhea,  bright red blood per rectum, melena, hematochezia, no GERD symptoms, no  polyuria or polydipsia and the remainder of her review of systems is  negative.   PHYSICAL EXAMINATION:  VITAL SIGNS:  She is afebrile.  Her pulse is 61, her  respirations are 20, blood pressure is 150/71, and she is in sinus rhythm on  telemetry in the 60s.  GENERAL:  She is a well-developed, well-nourished, very pleasant white  female in no apparent distress who is alert and oriented x4 and a very good  historian.  HEENT:  Unremarkable.  NECK:  Her neck was supple.  There is no jugular venous distension or  carotid bruits.  CHEST:  Her chest is clear to auscultation.  Cardiovascular exam is regular  with no significant murmur, first and second heart sounds are normal, her  point of maximal impulse is not displaced.  SKIN:  Without rashes.  BREAST/GU AND RECTAL EXAM:  Deferred.  ABDOMEN:  Soft, nontender, normoactive bowel sounds.  No hepatosplenomegaly.  EXTREMITIES:  Without significant clubbing, cyanosis, or edema.  Pulses are  2+ throughout with no significant bruits.  MUSCULOSKELETAL:  No deformities.  NEUROLOGICAL:  Grossly nonfocal.   STUDIES:  She had a chest x-ray which showed no significant abnormality, a   chest CT which showed no pulmonary embolus, no evidence of aortic dissection  but she did have a hiatal hernia.  Electrocardiogram shows sinus rhythm at a  rate of 64 with normal intervals, normal axes, no ischemic ST-T wave changes  and no Q waves concerning for an old myocardial infarction.   LABORATORIES:  White blood cell count is 6.4, H&H of 13 and 38, platelet  count 301.  Sodium 134, potassium 3.4, chloride 98, bicarb 27, BUN 10,  creatinine 1 and her blood sugar is 121.  Liver function studies are all  within normal limits.  D-dimer 0.23.  INR 1, PTT 34 and one set of cardiac  enzymes is normal.   ASSESSMENT:  1.  Atypical chest pain.  EKG and cardiac enzymes are reassuring, the      quality of the chest pain is very unusual for coronary disease.  2.  Hypertension.  Not entirely well controlled, probably needs to be better      addressed.  With the hypokalemia I suspect that a thiazide diuretic is      probably not the best medication for her unless potassium      supplementation is added.  Consideration may be made to add a beta      blocker after her stress test is completed.  3.  Hyperlipidemia.  We do not have any current numbers for her.  It would      probably be reasonable to check those.   PLAN:  1.  I think I would probably start with a GI cocktail and add some Protonix.      With her hiatal hernia on chest CT and her symptoms I suspect this may      be reflux.  2.  Plan is to do a rest-stress Myoview in the morning.  If her cardiac      enzymes look normal I would continue to cycle her cardiac enzymes.  If      those look fine then a rest-stress Myoview seems like a reasonable      screening test.  I would also check fasting lipids.  Obviously the if      data set matures and there is evidence of significant coronary ischemia      then we would move towards heart catheterization.      Vida Roller, M.D. Electronically Signed     JH/MEDQ  D:  10/09/2005  T:   10/09/2005  Job:  [id number]   cc:   Lorin Picket A. Gerda Diss, MD  Fax: [phone]

## 2011-01-18 NOTE — Procedures (Signed)
NAMEJIMI, [name]          ACCOUNT NO.:  [id number]   MEDICAL RECORD NO.:  [id number]          PATIENT TYPE:  INP   LOCATION:  A220                          FACILITY:  [facility]   PHYSICIAN:  Vida Roller, M.D.   DATE OF BIRTH:  [birth year]   DATE OF PROCEDURE:  DATE OF DISCHARGE:                                    STRESS TEST   HISTORY:  Ms. Seidenberg is a 77 year old female with no known coronary  disease admitted to [facility] with atypical chest discomfort.  Cardiac risk factors of diabetes, hypertension and hyperlipidemia as well as  family history. Cardiac enzymes have been negative x3 for acute myocardial  infarction.   BASELINE DATA:  Electrocardiogram reveals a sinus rhythm at 67 beats per  minute and nonspecific ST abnormalities. Blood pressure is 138/78.   Patient exercised for a total of 4 minutes 30 seconds, Bruce protocol stage  II at 6.5 minutes. Maximum heart rate achieved was 150 beats per minute  which is 100% predicted maximum. Maximum blood pressure is 182/78 and  resolved down to 148/62 in recovery. EKG revealed no ischemic changes. No  significant arrhythmias were noted.   Final images and results are pending M.D. review.      Jae Dire, P.A. [facility]      Vida Roller, M.D.  Electronically Signed    AB/MEDQ  D:  10/10/2005  T:  10/10/2005  Job:  [id number]

## 2011-01-18 NOTE — H&P (Signed)
[name], Proctor          ACCOUNT NO.:  [id number]   MEDICAL RECORD NO.:  [id number]          PATIENT TYPE:  INP   LOCATION:  A220                          FACILITY:  [facility]   PHYSICIAN:  Scott A. Gerda Diss, MD    DATE OF BIRTH:  May 13, 1934   DATE OF ADMISSION:  10/09/2005  DATE OF DISCHARGE:  LH                                HISTORY & PHYSICAL   CHIEF COMPLAINT:  Chest discomfort.   HISTORY OF PRESENT ILLNESS:  This patient relates she, over the past few  days, has had a substernal chest discomfort in the mid chest region.  She  also notices it between her scapula and her back.  She states the first  night it came on it woke her up from her sleep, lasting for a few minutes  and then seemed to have gotten better.  She went to sleep.  It was the next  morning she experienced it, and then the following night she experienced it  again, and this morning again she experienced it.  She describes it as a  tightness feeling, but not a heaviness.  She denies a sharp stabbing pain.  Denies reflux symptoms.  Denies regurgitations.  Denies vomiting.  Also,  denies abdominal pain, sweat, chills, states her energy level overall is  doing pretty decent, and she participates in an aerobics class at the  [facility] three times a week with other ladies her age, and states  that she does not have chest discomfort with this.   PAST MEDICAL HISTORY:  1.  Hyperlipidemia.  2.  Hypertension.  3.  Allergic rhinitis.  4.  Glucose dysfunction/occasional hyperglycemia.   FAMILY HISTORY:  Pertinent for breast cancer, hypertension, diabetes, heart  disease, cholesterol.   SOCIAL HISTORY:  Does not smoke.   ALLERGIES:  Had a reaction to tetanus shot.  Also, Pravachol triggered  fatigue and Macrodantin triggered a rash.   MEDICATIONS:  1.  Lescol 20 mg one daily.  2.  Glucosamine chondroitin twice daily.  3.  Citracal daily.  4.  Multivitamin daily.  5.  Diazide 37.5/25 one daily.  6.   Vitamin C daily.  7.  Zyrtec daily.  8.  Nasonex daily.  9.  Fish oil daily.   REVIEW OF SYSTEMS:  Per above.  Negative for headaches, blurred vision.  Negative for nausea, vomiting, diarrhea.  Negative for sweat.   PHYSICAL EXAMINATION:  GENERAL APPEARANCE:  NAD.  HEENT:  TM's NL.  T-NL.  NECK:  No masses.  No abnormal JVD.  CHEST:  CTA.  No crackles.  Chest wall nontender.  HEART:  Regular.  ABDOMEN:  Soft, no guarding or rebound.  EXTREMITIES:  No edema.  SKIN:  Warm and dry.   STUDIES:  EKG shows some ST segment flattening in the lateral leads with  also slight flattening in AVF.   ASSESSMENT/PLAN:  1.  Chest pain and discomfort.  The patient has risk factors.  Her symptoms      point more toward the possibility of angina, but certainly reflux,      aortic aneurysm, PE, gallbladder disease, musculoskeletal pain could  all      be contributing.  The main thing, for the safety of the patient, is      admit her.  Place her on telemetry, do serial enzymes x3.  She was given      an aspirin in the office.  We will go ahead and give her some      nitroglycerin at the hospital, plus also CT of the chest angio to check      for pulmonary embolus as well as aortic aneurysm.  Plus, also, consult      cardiology.  Expect patient to have to go through a Myoview.      Scott A. Gerda Diss, MD  Electronically Signed     SAL/MEDQ  D:  10/10/2005  T:  10/10/2005  Job:  [id number]

## 2011-01-24 ENCOUNTER — Ambulatory Visit (INDEPENDENT_AMBULATORY_CARE_PROVIDER_SITE_OTHER): Payer: Medicare Other | Admitting: Cardiology

## 2011-01-24 ENCOUNTER — Encounter: Payer: Self-pay | Admitting: Cardiology

## 2011-01-24 VITALS — BP 161/67 | HR 57 | Ht 63.0 in | Wt 149.0 lb

## 2011-01-24 DIAGNOSIS — I2510 Atherosclerotic heart disease of native coronary artery without angina pectoris: Secondary | ICD-10-CM

## 2011-01-24 DIAGNOSIS — I639 Cerebral infarction, unspecified: Secondary | ICD-10-CM

## 2011-01-24 DIAGNOSIS — Z8673 Personal history of transient ischemic attack (TIA), and cerebral infarction without residual deficits: Secondary | ICD-10-CM

## 2011-01-24 DIAGNOSIS — I10 Essential (primary) hypertension: Secondary | ICD-10-CM

## 2011-01-24 DIAGNOSIS — I6789 Other cerebrovascular disease: Secondary | ICD-10-CM

## 2011-01-24 DIAGNOSIS — E782 Mixed hyperlipidemia: Secondary | ICD-10-CM

## 2011-01-24 DIAGNOSIS — I6350 Cerebral infarction due to unspecified occlusion or stenosis of unspecified cerebral artery: Secondary | ICD-10-CM

## 2011-01-24 NOTE — Assessment & Plan Note (Signed)
Stable symptomatically at this time, without recurrent angina. Plan to continue medical therapy. She seems to be at a point where cardiac rehabilitation could be reconsidered. Referral will be made. At this point she is on aspirin alone, Effient was stopped by Neurology around the time of her right thalamic hemorrhage/stroke in late April. She has not been seen by Neurology in followup as an outpatient as yet. We are placing a call to [facility] to have her seen in the near future. In addition to following up on her stroke, hopefully they can help determine if/when she could be able to resume dual antiplatelet therapy in light of drug-eluting stent placement in March. I discussed this with the patient and her family today.Marland Kitchen

## 2011-01-24 NOTE — Assessment & Plan Note (Signed)
Lipid trend is improving, continue present regimen.

## 2011-01-24 NOTE — Progress Notes (Signed)
Clinical Summary [name] is a 77 y.o.female presenting for followup, last seen in April. At that time we reviewed her cardiac catheterization and intervention, medical therapy, discussed better blood pressure control, and arranged followup lab work. She was initiated on Altace.  Labs from May 5 showed potassium 4.2, BUN 13, creatinine 0.8, cholesterol 191, triglycerides 131, HDL 58, LDL 107, AST 20. ALT 17, HgbA1C 6.1. We discussed these today. Lipid trend is improving.  Patient is here with her son and daughter for followup. I reviewed her interval history which includes diagnosis of a hemorrhagic stroke associated with uncontrolled hypertension, also on antiplatelet medications. She had a right thalamic hemorrhage by CT scan in late April. She was seen by Neurology, taken off of Effient, and continued on low-dose aspirin. Altace has been further increased to twice daily dosing by Dr. Gerda Diss since I last saw her. Discharge summary does not indicate if or when patient could resume dual antiplatelet therapy. Her family members tell me that she has a pending visit with Dr. Pearlean Brownie sometime in July.  Reportedly she has done well with home PT, now at a point where she could consider cardiac rehabilitation. She has only mild left leg weakness when she gets tired. Also some memory deficits, short-term memory in particular based on her daughter's description. She gets assistance from her family, reports compliance with her medications. She denies any angina or progressive shortness of breath.  She reports compliance with her medications. Home blood pressure record shows fairly good control most of the time with systolics in the 120s to 130s.   Allergies  Allergen Reactions  . Macrodantin     Current outpatient prescriptions:Ascorbic Acid (VITAMIN C) 500 MG tablet, Take 1 tablet (500 mg total) by mouth as needed., Disp: 30 tablet, Rfl: 3;  aspirin 81 MG tablet, Take 81 mg by mouth daily.  , Disp: , Rfl:  ;  calcium citrate-vitamin D (CITRACAL+D) 315-200 MG-UNIT per tablet, Take 1 tablet by mouth daily.  , Disp: , Rfl: ;  Cholecalciferol (VITAMIN D3) 3000 UNITS TABS, Take 3,000 Units by mouth as needed., Disp: 30 tablet, Rfl: 3 Coenzyme Q10 150 MG CAPS, Take 150 mg by mouth daily.  , Disp: , Rfl: ;  fluvastatin (LESCOL) 40 MG capsule, Take 40 mg by mouth at bedtime.  , Disp: , Rfl: ;  metoprolol succinate (TOPROL-XL) 25 MG 24 hr tablet, Take 25 mg by mouth daily.  , Disp: , Rfl: ;  mometasone (NASONEX) 50 MCG/ACT nasal spray, 2 sprays by Nasal route as needed.  , Disp: , Rfl:  Multiple Vitamins-Minerals (CENTRUM) tablet, Take 1 tablet by mouth 2 (two) times daily. , Disp: , Rfl: ;  nitroGLYCERIN (NITROSTAT) 0.4 MG SL tablet, Place 0.4 mg under the tongue every 5 (five) minutes as needed.  , Disp: , Rfl: ;  Omega-3 Fatty Acids (FISH OIL) 1000 MG CAPS, Take 1,000 mg by mouth 2 (two) times daily. , Disp: , Rfl: ;  omeprazole (PRILOSEC) 20 MG capsule, Take 20 mg by mouth daily. , Disp: , Rfl:  Probiotic Product (PRO-BIOTIC BLEND) CAPS, Take by mouth daily.  , Disp: , Rfl: ;  ramipril (ALTACE) 5 MG capsule, Take 5 mg by mouth at bedtime. 2 tabs daily , Disp: , Rfl: ;  vitamin B-12 (CYANOCOBALAMIN) 1000 MCG tablet, Take 1,000 mcg by mouth daily.  , Disp: , Rfl: ;  DISCONTD: ramipril (ALTACE) 5 MG capsule, Take 1 capsule (5 mg total) by mouth at bedtime., Disp: 30 capsule,  Rfl: 6 prasugrel (EFFIENT) 10 MG TABS, Take 10 mg by mouth daily.  , Disp: , Rfl: ;  DISCONTD: fluvastatin (LESCOL) 20 MG capsule, Take 20 mg by mouth daily. , Disp: , Rfl: ;  DISCONTD: metroNIDAZOLE (FLAGYL) 500 MG tablet, Take 500 mg by mouth 3 (three) times daily.  , Disp: , Rfl: ;  DISCONTD: PRILOSEC OTC 20 MG tablet, , Disp: , Rfl: ;  DISCONTD: triamterene-hydrochlorothiazide (MAXZIDE-25) 37.5-25 MG per tablet, , Disp: , Rfl:   Past Medical History  Diagnosis Date  . Essential hypertension, benign   . Type 2 diabetes mellitus   . GERD  (gastroesophageal reflux disease)   . Cataracts, bilateral   . Diverticulitis   . Allergic rhinitis   . Syncope     Neurally mediated  . Coronary atherosclerosis of native coronary artery     DES LAD 3/12, 99% nondominant RCA, No CAD otherwise, LVEF 65%  . Stroke, hemorrhagic     Right thalamic hemorrhage 4/12    Past Surgical History  Procedure Date  . Breast cyst excision     Family History  Problem Relation Age of Onset  . Pancreatic cancer Mother     Died at age 64  . Coronary artery disease Father     Died in his 72s  . Heart attack Father   . Breast cancer Sister     Social History [name] reports that she has never smoked. She has never used smokeless tobacco. [name] reports that she does not drink alcohol.  Review of Systems Otherwise negative except as outlined above.  Physical Examination Filed Vitals:   01/24/11 1257  BP: 161/67  Pulse: 57   Normally nourished appearing elderly woman in no acute distress.  HEENT: Conjunctiva and lids are normal, oropharynx with moist mucosa.  Neck: Supple, no elevated JVP or bruits, or thyromegaly.  Lungs: Clear to auscultation, nonlabored.  Cardiac: Regular rate and rhythm, no S3 or pericardial rub.  Abdomen: Soft, nontender, bowel sounds present.  Skin: Warm and dry.  Musculoskeletal: No kyphosis.  Extremities: No pitting edema.  Neuropsychiatric: Alert and oriented x3, affect appropriate.   Studies Cardiac catheterization 11/16/2010:  Coronaries: There was distal calcification in the left main with 50%  stenosis. The LAD had long proximal stenosis. There was ostial 99%  lesion. There was mid long 25% stenosis. The mid diagonal was moderate  sized with luminal irregularities. There was a very tiny ramus  intermediate. Circumflex was a dominant vessel. There was proximal  tandem 25% stenosis. First obtuse marginal had proximal 30% stenosis.  Posterolateral x2 were small and normal. PDA was small and  normal. The  right coronary artery was nondominant with 99% mid stenosis.  Left ventriculogram: The left ventriculogram was obtained in the RAO  projection. The EF was 65% with normal wall motion.   Problem List and Plan

## 2011-01-24 NOTE — Assessment & Plan Note (Signed)
Altace dose has been advanced since my last visit with her. Home blood pressure checks show fairly reasonable blood pressure control overall. Continue to follow with Dr. Gerda Diss, and further medication adjustments can be made as needed.

## 2011-01-24 NOTE — Assessment & Plan Note (Addendum)
Right thalamic hemorrhagic infarct in late April as noted above. Patient has made significant improvements based on discussion with her and her family, completing home physical therapy.

## 2011-01-24 NOTE — Patient Instructions (Signed)
Your physician recommends that you continue on your current medications as directed. Please refer to the Current Medication list given to you today.  You have been referred to cardiac rehab  Your physician recommends that you return for lab work in: 3 months, just before next office visit  Your physician recommends that you schedule a follow-up appointment in: 3 months

## 2011-01-30 NOTE — Discharge Summary (Signed)
NAMECOLBY, [name]          ACCOUNT NO.:  [id number]  MEDICAL RECORD NO.:  [id number]           PATIENT TYPE:  I  LOCATION:                                FACILITY:  [facility]  PHYSICIAN:  Calvert Cantor, M.D.     DATE OF BIRTH:  [birth year]  DATE OF ADMISSION:  12/19/2010 DATE OF DISCHARGE:  12/21/2010                              DISCHARGE SUMMARY   PRESENTING COMPLAINT:  Dizziness.  DISCHARGE DIAGNOSES: 1. Hemorrhagic cerebrovascular accident, suspected to be from     uncontrolled hypertension. 2. Vertigo secondary to above.  PAST MEDICAL HISTORY: 1. Coronary artery disease status post stenting. 2. Hypertension. 3. Diabetes mellitus. 4. Hyperlipidemia. 5. Diverticulitis.  DISCHARGE MEDICATIONS:  She needs to stop Effient, which was 10 mg daily.  She can continue on the following: 1. Aspirin 81 mg daily. 2. Calcium plus vitamin D one tablet daily. 3. Coenzyme Q 150 mg daily. 4. Fish oil 1000 mg twice a day. 5. Lescol 20 mg daily. 6. Metoprolol XL 25 mg daily at bedtime. 7. Multivitamin one tablet daily. 8. Nasonex one spray daily as needed in each nostril. 9. Nitroglycerin 0.4 mg one tablet every 5 minutes as needed up to     three doses for chest pain. 10.Omeprazole 20 mg daily. 11.Probiotic Blend OTC one tablet daily. 12.Vitamin B12 one tablet daily.  CONSULTS DURING HOSPITAL STAY:  Neurology consult with Dr. Hoy Morn.  PERTINENT IMAGING:  CT scan of the head performed on December 18, 2010 revealed a right thalamic hemorrhage.  Repeat CT performed on December 20, 2010, revealed right thalamic hematoma, which was unchanged, measuring 15 x 24 mm.  CTA of the head and neck revealed mild atherosclerotic disease involving the left carotid bulb without significant stenosis by the NASCET criteria.  Mild atherosclerotic calcific disease involving the right carotid bulb and right external carotid artery.  No gross evidence of occlusion, stenosis, dissection, or aneurysm.   There was asymmetric blush in the region of the right thalamus probably representing reactive hyperemia related to the hemorrhage.  There was patency of the superior sagittal sinus noted.  HOSPITAL COURSE:  This is a 77 year old female who was in her usual state of health until Tuesday, April 17, when she noticed dizziness and balance issues.  She presented to [facility] where CT of the head revealed a right basal ganglia hemorrhage in the thalamic area. She was noted to have a blood pressure that had been in the systolic of 170s.  She was transferred to [facility] and admitted to the Hospitalist Service.  A consult was requested with Neurology.  She was evaluated by Dr. Hoy Morn, who recommended that we could restart her aspirin which can be titrated up eventually to 325 mg daily.  We should hold her Effient however.  He suspects that the hemorrhage was secondary to uncontrolled blood pressure and is recommending better blood pressure control.  PT/OT have been consulted and the patient will receive home health PT/OT.  PERTINENT LAB RESULTS:  Lipid profile was performed.  Cholesterol was noted to be 192, triglycerides 170, HDL was 50, and LDL 108.  The patient is advised to  continue strict control of dietary facts and continue her statin and fish oil.  PHYSICAL EXAM:  NEUROLOGIC:  Cranial nerves II-XII are intact.  Strength is intact in all four extremities.  Finger-to-nose test is intact. LUNGS:  Clear. HEART: Regular rate and rhythm.  No murmurs.  CONDITION ON DISCHARGE:  Stable.  FOLLOWUP INSTRUCTIONS:  Follow up with PCP in [place].  TIME ON DISCHARGE:  45 minutes.     Calvert Cantor, M.D.     SR/MEDQ  D:  01/14/2011  T:  01/14/2011  Job:  [id number]  Electronically Signed by Calvert Cantor M.D. on 01/30/2011 09:17:18 AM

## 2011-03-28 ENCOUNTER — Other Ambulatory Visit: Payer: Self-pay | Admitting: Neurology

## 2011-03-28 DIAGNOSIS — I619 Nontraumatic intracerebral hemorrhage, unspecified: Secondary | ICD-10-CM

## 2011-04-03 ENCOUNTER — Ambulatory Visit
Admission: RE | Admit: 2011-04-03 | Discharge: 2011-04-03 | Disposition: A | Discharge status: Home / Self Care | Payer: Medicare Other | Source: Ambulatory Visit | Attending: Neurology | Admitting: Neurology

## 2011-04-03 DIAGNOSIS — I619 Nontraumatic intracerebral hemorrhage, unspecified: Secondary | ICD-10-CM

## 2011-04-03 MED ORDER — GADOBENATE DIMEGLUMINE 529 MG/ML IV SOLN
14.0000 mL | Freq: Once | INTRAVENOUS | Status: AC | PRN
  Administered 2011-04-03: 14 mL via INTRAVENOUS

## 2011-04-18 ENCOUNTER — Encounter (HOSPITAL_COMMUNITY): Payer: Self-pay

## 2011-04-18 ENCOUNTER — Encounter (HOSPITAL_COMMUNITY)
Admission: RE | Admit: 2011-04-18 | Discharge: 2011-04-18 | Disposition: A | Discharge status: Home / Self Care | Payer: Medicare Other | Source: Ambulatory Visit | Attending: Cardiology | Admitting: Cardiology

## 2011-04-18 DIAGNOSIS — I2510 Atherosclerotic heart disease of native coronary artery without angina pectoris: Secondary | ICD-10-CM | POA: Insufficient documentation

## 2011-04-18 DIAGNOSIS — Z5189 Encounter for other specified aftercare: Secondary | ICD-10-CM | POA: Insufficient documentation

## 2011-04-18 DIAGNOSIS — Z9861 Coronary angioplasty status: Secondary | ICD-10-CM | POA: Insufficient documentation

## 2011-04-18 HISTORY — DX: Hyperlipidemia, unspecified: E78.5

## 2011-04-18 NOTE — Patient Instructions (Signed)
During orientation advised patient on arrival and appointment times what to wear, what to do before, during and after exercise. Reviewed attendance and class policy. Talked about inclement weather and class consultation policy.   

## 2011-04-18 NOTE — Progress Notes (Signed)
Orientation completed. Scheduled to start on Monday 04/22/11 at 9:30am. Pt is registered and records will be requested. Pt has had a stroke which has left feeling confused at time. She is eager to get started.

## 2011-04-22 ENCOUNTER — Encounter (HOSPITAL_COMMUNITY)
Admission: RE | Admit: 2011-04-22 | Discharge: 2011-04-22 | Disposition: A | Discharge status: Home / Self Care | Payer: Medicare Other | Source: Ambulatory Visit | Attending: Cardiology | Admitting: Cardiology

## 2011-04-24 ENCOUNTER — Encounter (HOSPITAL_COMMUNITY)
Admission: RE | Admit: 2011-04-24 | Discharge: 2011-04-24 | Disposition: A | Discharge status: Home / Self Care | Payer: Medicare Other | Source: Ambulatory Visit | Attending: Cardiology | Admitting: Cardiology

## 2011-04-26 ENCOUNTER — Encounter (HOSPITAL_COMMUNITY)
Admission: RE | Admit: 2011-04-26 | Discharge: 2011-04-26 | Disposition: A | Discharge status: Home / Self Care | Payer: Medicare Other | Source: Ambulatory Visit | Attending: Cardiology | Admitting: Cardiology

## 2011-04-26 ENCOUNTER — Ambulatory Visit (INDEPENDENT_AMBULATORY_CARE_PROVIDER_SITE_OTHER): Payer: Medicare Other | Admitting: Cardiology

## 2011-04-26 ENCOUNTER — Encounter: Payer: Self-pay | Admitting: Cardiology

## 2011-04-26 VITALS — BP 132/76 | HR 80 | Resp 16 | Ht 63.0 in | Wt 149.0 lb

## 2011-04-26 DIAGNOSIS — I10 Essential (primary) hypertension: Secondary | ICD-10-CM

## 2011-04-26 DIAGNOSIS — I2510 Atherosclerotic heart disease of native coronary artery without angina pectoris: Secondary | ICD-10-CM

## 2011-04-26 DIAGNOSIS — E782 Mixed hyperlipidemia: Secondary | ICD-10-CM

## 2011-04-26 NOTE — Assessment & Plan Note (Signed)
LDL continues to improve on current regimen.

## 2011-04-26 NOTE — Progress Notes (Signed)
Clinical Summary [name] is a 77 y.o.female presenting for followup. She was seen in May of this year. She is here with both her son and her daughter. She reports no anginal chest pain or progressive shortness of breath, no nitroglycerin use. She has been exercising in the cardiac rehabilitation program. Records presented me today show good blood pressure control pre-exercise. She does state that her blood pressure has increased at times, particularly when she is anxious. She and her daughter discussed the possibility of an anxiolytic medication, and plan to review this with her primary care physician.  Followup note from Dr. Pearlean Brownie was reviewed, and his recommendation was that Plavix could be initiated, but not dual antiplatelet therapy in light of her prior history of right thalamic hemorrhagic infarct. I discussed this possibility with the patient, her son and daughter. She herself is hesitant to initiate Plavix, and is most comfortable staying on aspirin alone at this time. Actually neither by themselves would be optimal therapy in the first year following drug-eluting stent placement in terms of reduction in risk of stent thrombosis. We discussed this today. Since she is worried about using Plavix, and most comfortable staying on aspirin alone, we elected to continue her current dose. It is not entirely clear that higher dose aspirin would make much of a difference either, although would increase her bleeding risk. I did explain that if there is a change in her cardiac status in terms of acute coronary syndrome or need for further intervention, we may need to alter this recommendation. She voiced comfort with this.  Followup lipid profile in July showed cholesterol 190, triglycerides 192, HDL 60, LDL 92, AST 21, ALT 20. Previous LDL was 107. We discussed the results. She reports compliance with her medications.  She has had some episodes of syncope that sound to be neurocardiogenic in nature based  on their description. Both occurred after having nausea and abdominal discomfort. She has had no sense of orthostatic dizziness, no palpitations.  Allergies  Allergen Reactions  . Macrodantin     Medication list reviewed.  Past Medical History  Diagnosis Date  . Essential hypertension, benign   . Type 2 diabetes mellitus   . GERD (gastroesophageal reflux disease)   . Cataracts, bilateral   . Diverticulitis   . Allergic rhinitis   . Syncope     Neurally mediated  . Coronary atherosclerosis of native coronary artery     DES LAD 3/12, 99% nondominant RCA, No CAD otherwise, LVEF 65%  . Stroke, hemorrhagic     Right thalamic hemorrhage 4/12  . Diabetes mellitus   . Hyperlipidemia   . Stroke     Past Surgical History  Procedure Date  . Breast cyst excision   . Cardiac catheterization   . Carotid stent 11/16/10    Family History  Problem Relation Age of Onset  . Pancreatic cancer Mother     Died at age 91  . Coronary artery disease Father     Died in his 11s  . Heart attack Father   . Breast cancer Sister     Social History [name] reports that she quit smoking about 22 years ago. Her smoking use included Cigarettes. She smoked .25 packs per day. She has never used smokeless tobacco. [name] reports that she does not drink alcohol.  Review of Systems Negative except as outlined above.  Physical Examination Filed Vitals:   04/26/11 1315  BP: 132/76  Pulse: 80  Resp: 16   Normally  nourished appearing elderly woman in no acute distress.  HEENT: Conjunctiva and lids are normal, oropharynx with moist mucosa.  Neck: Supple, no elevated JVP or bruits, or thyromegaly.  Lungs: Clear to auscultation, nonlabored.  Cardiac: Regular rate and rhythm, no S3 or pericardial rub.  Abdomen: Soft, nontender, bowel sounds present.  Skin: Warm and dry.  Musculoskeletal: No kyphosis.  Extremities: No pitting edema.  Neuropsychiatric: Alert and oriented x3, affect  appropriate.    Problem List and Plan

## 2011-04-26 NOTE — Patient Instructions (Signed)
Your physician recommends that you continue on your current medications as directed. Please refer to the Current Medication list given to you today.  Your physician recommends that you schedule a follow-up appointment in: 3 months  

## 2011-04-26 NOTE — Assessment & Plan Note (Addendum)
Symptomatically stable on medical therapy. She continues in cardiac rehabilitation, denies any exertional angina or progressive shortness of breath. Pre-exercise blood pressures reviewed, showing good control in general. She reports compliance with her medications, and at this point we will continue observation. She is approximately 6 months out from prior intervention using DES, and has not been on dual antiplatelet therapy related to her right thalamic hemorrhagic infarct. After discussing use of aspirin versus Plavix alone (Dr. Pearlean Brownie warned against use of dual antiplatelet therapy in this case), she is most comfortable with staying on aspirin. We did discuss the real yet statistically low likelihood of stent thrombosis with either antiplatelet treatment alone.

## 2011-04-26 NOTE — Assessment & Plan Note (Signed)
For now plan to continue the present medical regimen. We discussed sodium restriction, also diet and exercise. I asked her to let me know if blood pressure trend increases consistently.

## 2011-04-29 ENCOUNTER — Encounter (HOSPITAL_COMMUNITY)
Admission: RE | Admit: 2011-04-29 | Discharge: 2011-04-29 | Disposition: A | Discharge status: Home / Self Care | Payer: Medicare Other | Source: Ambulatory Visit | Attending: Cardiology | Admitting: Cardiology

## 2011-05-01 ENCOUNTER — Encounter (HOSPITAL_COMMUNITY)
Admission: RE | Admit: 2011-05-01 | Discharge: 2011-05-01 | Disposition: A | Discharge status: Home / Self Care | Payer: Medicare Other | Source: Ambulatory Visit | Attending: Cardiology | Admitting: Cardiology

## 2011-05-03 ENCOUNTER — Encounter (HOSPITAL_COMMUNITY)
Admission: RE | Admit: 2011-05-03 | Discharge: 2011-05-03 | Disposition: A | Discharge status: Home / Self Care | Payer: Medicare Other | Source: Ambulatory Visit | Attending: Cardiology | Admitting: Cardiology

## 2011-05-06 ENCOUNTER — Encounter (HOSPITAL_COMMUNITY): Payer: Medicare Other

## 2011-05-08 ENCOUNTER — Encounter (HOSPITAL_COMMUNITY)
Admission: RE | Admit: 2011-05-08 | Discharge: 2011-05-08 | Disposition: A | Discharge status: Home / Self Care | Payer: Medicare Other | Source: Ambulatory Visit | Attending: Cardiology | Admitting: Cardiology

## 2011-05-08 DIAGNOSIS — Z5189 Encounter for other specified aftercare: Secondary | ICD-10-CM | POA: Insufficient documentation

## 2011-05-08 DIAGNOSIS — Z9861 Coronary angioplasty status: Secondary | ICD-10-CM | POA: Insufficient documentation

## 2011-05-08 DIAGNOSIS — I2510 Atherosclerotic heart disease of native coronary artery without angina pectoris: Secondary | ICD-10-CM | POA: Insufficient documentation

## 2011-05-10 ENCOUNTER — Encounter (HOSPITAL_COMMUNITY)
Admission: RE | Admit: 2011-05-10 | Discharge: 2011-05-10 | Disposition: A | Discharge status: Home / Self Care | Payer: Medicare Other | Source: Ambulatory Visit | Attending: Cardiology | Admitting: Cardiology

## 2011-05-13 ENCOUNTER — Encounter (HOSPITAL_COMMUNITY)
Admission: RE | Admit: 2011-05-13 | Discharge: 2011-05-13 | Disposition: A | Discharge status: Home / Self Care | Payer: Medicare Other | Source: Ambulatory Visit | Attending: Cardiology | Admitting: Cardiology

## 2011-05-15 ENCOUNTER — Encounter (HOSPITAL_COMMUNITY)
Admission: RE | Admit: 2011-05-15 | Discharge: 2011-05-15 | Disposition: A | Discharge status: Home / Self Care | Payer: Medicare Other | Source: Ambulatory Visit | Attending: Cardiology | Admitting: Cardiology

## 2011-05-17 ENCOUNTER — Encounter: Payer: Self-pay | Admitting: *Deleted

## 2011-05-17 ENCOUNTER — Encounter (HOSPITAL_COMMUNITY)
Admission: RE | Admit: 2011-05-17 | Discharge: 2011-05-17 | Disposition: A | Discharge status: Home / Self Care | Payer: Medicare Other | Source: Ambulatory Visit | Attending: Cardiology | Admitting: Cardiology

## 2011-05-17 MED ORDER — METOPROLOL SUCCINATE ER 25 MG PO TB24: 25.0000 mg | ORAL_TABLET | Freq: Every day | ORAL | Status: DC

## 2011-05-17 NOTE — Telephone Encounter (Signed)
This encounter was created in error - please disregard.

## 2011-05-20 ENCOUNTER — Encounter (HOSPITAL_COMMUNITY)
Admission: RE | Admit: 2011-05-20 | Discharge: 2011-05-20 | Disposition: A | Discharge status: Home / Self Care | Payer: Medicare Other | Source: Ambulatory Visit | Attending: Cardiology | Admitting: Cardiology

## 2011-05-22 ENCOUNTER — Encounter (HOSPITAL_COMMUNITY)
Admission: RE | Admit: 2011-05-22 | Discharge: 2011-05-22 | Disposition: A | Discharge status: Home / Self Care | Payer: Medicare Other | Source: Ambulatory Visit | Attending: Cardiology | Admitting: Cardiology

## 2011-05-24 ENCOUNTER — Encounter (HOSPITAL_COMMUNITY): Payer: Medicare Other

## 2011-05-27 ENCOUNTER — Encounter (HOSPITAL_COMMUNITY)
Admission: RE | Admit: 2011-05-27 | Discharge: 2011-05-27 | Disposition: A | Discharge status: Home / Self Care | Payer: Medicare Other | Source: Ambulatory Visit | Attending: Cardiology | Admitting: Cardiology

## 2011-05-29 ENCOUNTER — Encounter (HOSPITAL_COMMUNITY)
Admission: RE | Admit: 2011-05-29 | Discharge: 2011-05-29 | Disposition: A | Discharge status: Home / Self Care | Payer: Medicare Other | Source: Ambulatory Visit | Attending: Cardiology | Admitting: Cardiology

## 2011-05-30 ENCOUNTER — Encounter: Payer: Self-pay | Admitting: Cardiology

## 2011-05-31 ENCOUNTER — Encounter (HOSPITAL_COMMUNITY): Payer: Medicare Other

## 2011-06-03 ENCOUNTER — Encounter (HOSPITAL_COMMUNITY): Payer: Medicare Other

## 2011-06-05 ENCOUNTER — Encounter (HOSPITAL_COMMUNITY): Payer: Medicare Other

## 2011-06-07 ENCOUNTER — Encounter (HOSPITAL_COMMUNITY)
Admission: RE | Admit: 2011-06-07 | Discharge: 2011-06-07 | Disposition: A | Discharge status: Home / Self Care | Payer: Medicare Other | Source: Ambulatory Visit | Attending: Cardiology | Admitting: Cardiology

## 2011-06-07 DIAGNOSIS — Z5189 Encounter for other specified aftercare: Secondary | ICD-10-CM | POA: Insufficient documentation

## 2011-06-07 DIAGNOSIS — Z9861 Coronary angioplasty status: Secondary | ICD-10-CM | POA: Insufficient documentation

## 2011-06-07 DIAGNOSIS — I2510 Atherosclerotic heart disease of native coronary artery without angina pectoris: Secondary | ICD-10-CM | POA: Insufficient documentation

## 2011-06-10 ENCOUNTER — Encounter (HOSPITAL_COMMUNITY)
Admission: RE | Admit: 2011-06-10 | Discharge: 2011-06-10 | Disposition: A | Discharge status: Home / Self Care | Payer: Medicare Other | Source: Ambulatory Visit | Attending: Cardiology | Admitting: Cardiology

## 2011-06-12 ENCOUNTER — Encounter (HOSPITAL_COMMUNITY)
Admission: RE | Admit: 2011-06-12 | Discharge: 2011-06-12 | Disposition: A | Discharge status: Home / Self Care | Payer: Medicare Other | Source: Ambulatory Visit | Attending: Cardiology | Admitting: Cardiology

## 2011-06-14 ENCOUNTER — Encounter (HOSPITAL_COMMUNITY)
Admission: RE | Admit: 2011-06-14 | Discharge: 2011-06-14 | Disposition: A | Discharge status: Home / Self Care | Payer: Medicare Other | Source: Ambulatory Visit | Attending: Cardiology | Admitting: Cardiology

## 2011-06-17 ENCOUNTER — Encounter (HOSPITAL_COMMUNITY)
Admission: RE | Admit: 2011-06-17 | Discharge: 2011-06-17 | Disposition: A | Discharge status: Home / Self Care | Payer: Medicare Other | Source: Ambulatory Visit | Attending: Cardiology | Admitting: Cardiology

## 2011-06-19 ENCOUNTER — Encounter (HOSPITAL_COMMUNITY)
Admission: RE | Admit: 2011-06-19 | Discharge: 2011-06-19 | Disposition: A | Discharge status: Home / Self Care | Payer: Medicare Other | Source: Ambulatory Visit | Attending: Cardiology | Admitting: Cardiology

## 2011-06-21 ENCOUNTER — Encounter (HOSPITAL_COMMUNITY): Payer: Medicare Other

## 2011-06-24 ENCOUNTER — Ambulatory Visit (HOSPITAL_COMMUNITY)
Admission: RE | Admit: 2011-06-24 | Discharge: 2011-06-24 | Disposition: A | Discharge status: Home / Self Care | Payer: Medicare Other | Source: Ambulatory Visit | Attending: Family Medicine | Admitting: Family Medicine

## 2011-06-24 ENCOUNTER — Other Ambulatory Visit: Payer: Self-pay | Admitting: Family Medicine

## 2011-06-24 ENCOUNTER — Encounter (HOSPITAL_COMMUNITY): Payer: Medicare Other

## 2011-06-24 DIAGNOSIS — M79605 Pain in left leg: Secondary | ICD-10-CM

## 2011-06-24 DIAGNOSIS — I82409 Acute embolism and thrombosis of unspecified deep veins of unspecified lower extremity: Secondary | ICD-10-CM | POA: Insufficient documentation

## 2011-06-24 DIAGNOSIS — M79609 Pain in unspecified limb: Secondary | ICD-10-CM | POA: Insufficient documentation

## 2011-06-26 ENCOUNTER — Encounter (HOSPITAL_COMMUNITY): Payer: Medicare Other

## 2011-06-28 ENCOUNTER — Encounter (HOSPITAL_COMMUNITY): Payer: Medicare Other

## 2011-07-01 ENCOUNTER — Encounter (HOSPITAL_COMMUNITY)
Admission: RE | Admit: 2011-07-01 | Discharge: 2011-07-01 | Disposition: A | Discharge status: Home / Self Care | Payer: Medicare Other | Source: Ambulatory Visit | Attending: Cardiology | Admitting: Cardiology

## 2011-07-03 ENCOUNTER — Encounter (HOSPITAL_COMMUNITY)
Admission: RE | Admit: 2011-07-03 | Discharge: 2011-07-03 | Disposition: A | Discharge status: Home / Self Care | Payer: Medicare Other | Source: Ambulatory Visit | Attending: Cardiology | Admitting: Cardiology

## 2011-07-05 ENCOUNTER — Encounter (HOSPITAL_COMMUNITY)
Admission: RE | Admit: 2011-07-05 | Discharge: 2011-07-05 | Disposition: A | Discharge status: Home / Self Care | Payer: Medicare Other | Source: Ambulatory Visit | Attending: Cardiology | Admitting: Cardiology

## 2011-07-05 DIAGNOSIS — Z9861 Coronary angioplasty status: Secondary | ICD-10-CM | POA: Insufficient documentation

## 2011-07-05 DIAGNOSIS — I2510 Atherosclerotic heart disease of native coronary artery without angina pectoris: Secondary | ICD-10-CM | POA: Insufficient documentation

## 2011-07-05 DIAGNOSIS — Z5189 Encounter for other specified aftercare: Secondary | ICD-10-CM | POA: Insufficient documentation

## 2011-07-08 ENCOUNTER — Encounter (HOSPITAL_COMMUNITY): Payer: Medicare Other

## 2011-07-10 ENCOUNTER — Encounter (HOSPITAL_COMMUNITY)
Admission: RE | Admit: 2011-07-10 | Discharge: 2011-07-10 | Disposition: A | Discharge status: Home / Self Care | Payer: Medicare Other | Source: Ambulatory Visit | Attending: Cardiology | Admitting: Cardiology

## 2011-07-12 ENCOUNTER — Encounter (HOSPITAL_COMMUNITY)
Admission: RE | Admit: 2011-07-12 | Discharge: 2011-07-12 | Disposition: A | Discharge status: Home / Self Care | Payer: Medicare Other | Source: Ambulatory Visit | Attending: Cardiology | Admitting: Cardiology

## 2011-07-15 ENCOUNTER — Encounter (HOSPITAL_COMMUNITY)
Admission: RE | Admit: 2011-07-15 | Discharge: 2011-07-15 | Disposition: A | Discharge status: Home / Self Care | Payer: Medicare Other | Source: Ambulatory Visit | Attending: Cardiology | Admitting: Cardiology

## 2011-07-17 ENCOUNTER — Encounter (HOSPITAL_COMMUNITY)
Admission: RE | Admit: 2011-07-17 | Discharge: 2011-07-17 | Disposition: A | Discharge status: Home / Self Care | Payer: Medicare Other | Source: Ambulatory Visit | Attending: Cardiology | Admitting: Cardiology

## 2011-07-19 ENCOUNTER — Encounter (HOSPITAL_COMMUNITY)
Admission: RE | Admit: 2011-07-19 | Discharge: 2011-07-19 | Disposition: A | Discharge status: Home / Self Care | Payer: Medicare Other | Source: Ambulatory Visit | Attending: Cardiology | Admitting: Cardiology

## 2011-07-19 ENCOUNTER — Encounter: Payer: Self-pay | Admitting: Cardiology

## 2011-07-22 ENCOUNTER — Encounter (HOSPITAL_COMMUNITY)
Admission: RE | Admit: 2011-07-22 | Discharge: 2011-07-22 | Disposition: A | Discharge status: Home / Self Care | Payer: Medicare Other | Source: Ambulatory Visit | Attending: Cardiology | Admitting: Cardiology

## 2011-07-24 ENCOUNTER — Encounter (HOSPITAL_COMMUNITY)
Admission: RE | Admit: 2011-07-24 | Discharge: 2011-07-24 | Disposition: A | Discharge status: Home / Self Care | Payer: Medicare Other | Source: Ambulatory Visit | Attending: Cardiology | Admitting: Cardiology

## 2011-07-26 ENCOUNTER — Encounter (HOSPITAL_COMMUNITY): Payer: Medicare Other

## 2011-07-29 ENCOUNTER — Encounter (HOSPITAL_COMMUNITY)
Admission: RE | Admit: 2011-07-29 | Discharge: 2011-07-29 | Disposition: A | Discharge status: Home / Self Care | Payer: Medicare Other | Source: Ambulatory Visit | Attending: Cardiology | Admitting: Cardiology

## 2011-07-31 ENCOUNTER — Encounter: Payer: Self-pay | Admitting: *Deleted

## 2011-07-31 ENCOUNTER — Ambulatory Visit (INDEPENDENT_AMBULATORY_CARE_PROVIDER_SITE_OTHER): Payer: Medicare Other | Admitting: Cardiology

## 2011-07-31 ENCOUNTER — Encounter (HOSPITAL_COMMUNITY)
Admission: RE | Admit: 2011-07-31 | Discharge: 2011-07-31 | Disposition: A | Discharge status: Home / Self Care | Payer: Medicare Other | Source: Ambulatory Visit | Attending: Cardiology | Admitting: Cardiology

## 2011-07-31 ENCOUNTER — Encounter: Payer: Self-pay | Admitting: Cardiology

## 2011-07-31 VITALS — BP 159/69 | HR 64 | Resp 18 | Ht 63.0 in | Wt 139.1 lb

## 2011-07-31 DIAGNOSIS — I10 Essential (primary) hypertension: Secondary | ICD-10-CM

## 2011-07-31 DIAGNOSIS — I2510 Atherosclerotic heart disease of native coronary artery without angina pectoris: Secondary | ICD-10-CM

## 2011-07-31 DIAGNOSIS — E782 Mixed hyperlipidemia: Secondary | ICD-10-CM

## 2011-07-31 MED ORDER — AMLODIPINE BESYLATE 2.5 MG PO TABS: 2.5000 mg | ORAL_TABLET | Freq: Every day | ORAL | Status: DC

## 2011-07-31 NOTE — Assessment & Plan Note (Signed)
Recent lipid numbers reviewed. Continue present regimen.

## 2011-07-31 NOTE — Assessment & Plan Note (Signed)
Symptomatically stable on medical therapy. She plans to complete cardiac rehabilitation. We discussed continue in a walking regimen after this. Followup arranged.

## 2011-07-31 NOTE — Assessment & Plan Note (Signed)
Blood pressure not optimally controlled. Will add Norvasc 2.5 mg daily.

## 2011-07-31 NOTE — Progress Notes (Signed)
Clinical Summary [name] is a 77 y.o.female presenting for followup. She was seen in August.  She is here with her son and daughter today. States that she continues to enjoy cardiac rehabilitation, no active angina or nitroglycerin requirement. Still seems to have trouble with anxiety, although reports that this is somewhat better on anxiolytic medication and recently started Celexa. This has been managed by [name].  Lab work from July showed cholesterol 190, triglycerides 192, HDL 60, LDL 92, AST 21, ALT 20. We reviewed this today.  She reports compliance with her medications, family confirms that they have been watching salt in her diet. Blood pressure is not optimally controlled based on review of cardiac rehabilitation records as well. We discussed further advancing medical therapy.   Allergies  Allergen Reactions  . Macrodantin     Medication list reviewed.  Past Medical History  Diagnosis Date  . Essential hypertension, benign   . Type 2 diabetes mellitus   . GERD (gastroesophageal reflux disease)   . Cataracts, bilateral   . Diverticulitis   . Allergic rhinitis   . Syncope     Neurally mediated  . Coronary atherosclerosis of native coronary artery     DES LAD 3/12, 99% nondominant RCA, No CAD otherwise, LVEF 65%  . Stroke, hemorrhagic     Right thalamic hemorrhage 4/12  . Diabetes mellitus   . Hyperlipidemia   . Stroke     Past Surgical History  Procedure Date  . Breast cyst excision   . Cardiac catheterization   . Carotid stent 11/16/10    Family History  Problem Relation Age of Onset  . Pancreatic cancer Mother     Died at age 76  . Coronary artery disease Father     Died in his 72s  . Heart attack Father   . Breast cancer Sister     Social History [name] reports that she quit smoking about 22 years ago. Her smoking use included Cigarettes. She smoked .25 packs per day. She has never used smokeless tobacco. [name] reports that she  does not drink alcohol.  Review of Systems No palpitations, speech problems, falls or syncope. No reported bleeding problems. Otherwise negative except as outlined.  Physical Examination Filed Vitals:   07/31/11 1303  BP: 159/69  Pulse: 64  Resp: 18    Normally nourished appearing elderly woman in no acute distress.  HEENT: Conjunctiva and lids are normal, oropharynx with moist mucosa.  Neck: Supple, no elevated JVP or bruits, or thyromegaly.  Lungs: Clear to auscultation, nonlabored.  Cardiac: Regular rate and rhythm, no S3 or pericardial rub.  Abdomen: Soft, nontender, bowel sounds present.  Skin: Warm and dry.  Musculoskeletal: No kyphosis.  Extremities: No pitting edema.  Neuropsychiatric: Alert and oriented x3, affect appropriate.    Problem List and Plan

## 2011-07-31 NOTE — Patient Instructions (Signed)
Your physician recommends that you schedule a follow-up appointment in: 4 months  Your physician has recommended you make the following change in your medication:  Start Norvasc (Amlodipine) 2.5 mg daily

## 2011-08-02 ENCOUNTER — Encounter (HOSPITAL_COMMUNITY): Payer: Medicare Other

## 2011-08-05 ENCOUNTER — Encounter (HOSPITAL_COMMUNITY)
Admission: RE | Admit: 2011-08-05 | Discharge: 2011-08-05 | Disposition: A | Discharge status: Home / Self Care | Payer: Medicare Other | Source: Ambulatory Visit | Attending: Cardiology | Admitting: Cardiology

## 2011-08-05 DIAGNOSIS — Z9861 Coronary angioplasty status: Secondary | ICD-10-CM | POA: Insufficient documentation

## 2011-08-05 DIAGNOSIS — Z5189 Encounter for other specified aftercare: Secondary | ICD-10-CM | POA: Insufficient documentation

## 2011-08-05 DIAGNOSIS — I2510 Atherosclerotic heart disease of native coronary artery without angina pectoris: Secondary | ICD-10-CM | POA: Insufficient documentation

## 2011-08-07 ENCOUNTER — Encounter (HOSPITAL_COMMUNITY): Payer: Medicare Other

## 2011-08-09 ENCOUNTER — Encounter (HOSPITAL_COMMUNITY)
Admission: RE | Admit: 2011-08-09 | Discharge: 2011-08-09 | Disposition: A | Discharge status: Home / Self Care | Payer: Medicare Other | Source: Ambulatory Visit | Attending: Cardiology | Admitting: Cardiology

## 2011-08-12 ENCOUNTER — Encounter (HOSPITAL_COMMUNITY)
Admission: RE | Admit: 2011-08-12 | Discharge: 2011-08-12 | Disposition: A | Discharge status: Home / Self Care | Payer: Medicare Other | Source: Ambulatory Visit | Attending: Cardiology | Admitting: Cardiology

## 2011-08-14 NOTE — Progress Notes (Signed)
Cardiac Rehabilitation Program Progress Report   Orientation:  04/18/2011 Graduate Date:  tbd Discharge Date:  tbd # of sessions completed: 3  Cardiologist: Nona Dell Family MD:  Lubertha South Class Time:  09:30   A.  Exercise Program:  Tolerates exercise @ 2.4 METS for 15 minutes  B.  Mental Health:  Good mental attitude  C.  Education/Instruction/Skills  Knows THR for exercise and Uses Perceived Exertion Scale and/or Dyspnea Scale  Uses Perceived Exertion Scale and/or Dyspnea Scale  D.  Nutrition/Weight Control/Body Composition:  Adherence to prescribed nutrition program: good   *This section completed by Mickle Plumb, Andres Shad, RD, LDN, CDE  E.  Blood Lipids    Lab Results  Component Value Date   CHOL  Value: 192        ATP III CLASSIFICATION:  <200     mg/dL   Desirable  161-096  mg/dL   Borderline High  >=045    mg/dL   High        12/09/8117     Lab Results  Component Value Date   TRIG 170* 12/20/2010     Lab Results  Component Value Date   HDL 50 12/20/2010     Lab Results  Component Value Date   CHOLHDL 3.8 12/20/2010     No results found for this basename: LDLDIRECT      F.  Lifestyle Changes:  Making positive lifestyle changes  G.  Symptoms noted with exercise:  Asymptomatic  Report Completed By:  Angelica Pou   Comments:  This is patients 1st week report. She achieved a peak mets of 2.4. Her resting HR is49 and her resting BP is 120/70. Her peak HR is 74 and peak BP is 160/70. She is motivated to continue to exercise.

## 2011-08-14 NOTE — Progress Notes (Signed)
Cardiac Rehabilitation Program Progress Report   Orientation: 04/18/2011 Graduate Date:  tbd Discharge Date:  tbd # of sessions completed: 18  Cardiologist: Nona Dell Family MD:  Lubertha South Class Time:  09:30  A.  Exercise Program:  Tolerates exercise @ 2.5 METS for 15 minutes  B.  Mental Health:  Good mental attitude  C.  Education/Instruction/Skills  Knows THR for exercise and Uses Perceived Exertion Scale and/or Dyspnea Scale  Uses Perceived Exertion Scale and/or Dyspnea Scale  D.  Nutrition/Weight Control/Body Composition:  Adherence to prescribed nutrition program: good   *This section completed by Mickle Plumb, Andres Shad, RD, LDN, CDE  E.  Blood Lipids    Lab Results  Component Value Date   CHOL  Value: 192        ATP III CLASSIFICATION:  <200     mg/dL   Desirable  782-956  mg/dL   Borderline High  >=213    mg/dL   High        0/86/5784     Lab Results  Component Value Date   TRIG 170* 12/20/2010     Lab Results  Component Value Date   HDL 50 12/20/2010     Lab Results  Component Value Date   CHOLHDL 3.8 12/20/2010     No results found for this basename: LDLDIRECT      F.  Lifestyle Changes:  Making positive lifestyle changes  G.  Symptoms noted with exercise:  Asymptomatic  Report Completed By:  Angelica Pou   Comments:  This is patients 18 visit her halfway point. She achieved a peak mets of 2.5. Her resting HR is 52 and her resting BP is 192/82. Her Peak HR is 83 and peak BP is 190/60. A final report will follow when she graduates.

## 2011-08-14 NOTE — Progress Notes (Signed)
Cardiac Rehabilitation Program Progress Report   Orientation:  04/18/2011 Graduate Date:  08/12/2011 Discharge Date:  08/12/2011 # of sessions completed: 36  Cardiologist: Hermina Staggers MD:  Lubertha South Class Time:  09:30  A.  Exercise Program:  Tolerates exercise @ 2.2 METS for 15 minutes and Discharged to home exercise program.  Anticipated compliance:  excellent  B.  Mental Health:  Good mental attitude  C.  Education/Instruction/Skills  Knows THR for exercise and Uses Perceived Exertion Scale and/or Dyspnea Scale  Attended all education classes  D.  Nutrition/Weight Control/Body Composition:  Adherence to prescribed nutrition program: good   *This section completed by Mickle Plumb, Andres Shad, RD, LDN, CDE  E.  Blood Lipids    Lab Results  Component Value Date   CHOL  Value: 192        ATP III CLASSIFICATION:  <200     mg/dL   Desirable  409-811  mg/dL   Borderline High  >=914    mg/dL   High        7/82/9562     Lab Results  Component Value Date   TRIG 170* 12/20/2010     Lab Results  Component Value Date   HDL 50 12/20/2010     Lab Results  Component Value Date   CHOLHDL 3.8 12/20/2010     No results found for this basename: LDLDIRECT      F.  Lifestyle Changes:  Making positive lifestyle changes  G.  Symptoms noted with exercise:  Asymptomatic  Report Completed By:  Angelica Pou   Comments:  Mrs Peragine has progressed nicely to 30 minutes of aerobic exercise @ Max Met of 2.2 and 10 min of strength and flexibility exercises. Vital sign have become more stable as the classes went on. Patient has seen the dietician. DC instructions were given and discussed in detail Pt verbalized understanding. Patient plans to exercise at home by walking and doing the stretching exercises. She is motivated. Cardiac Rehab staff will follow up with patient at 1 month, 6 months and 1 year.

## 2011-09-11 IMAGING — CR DG CHEST 2V
2 series · 2 of 2 positions shown · non-contrast
Comparison: 11/20/2010

CLINICAL DATA: 77-year-old with weakness.

CHEST - 2 VIEW

[view not recorded (1 of 2)]
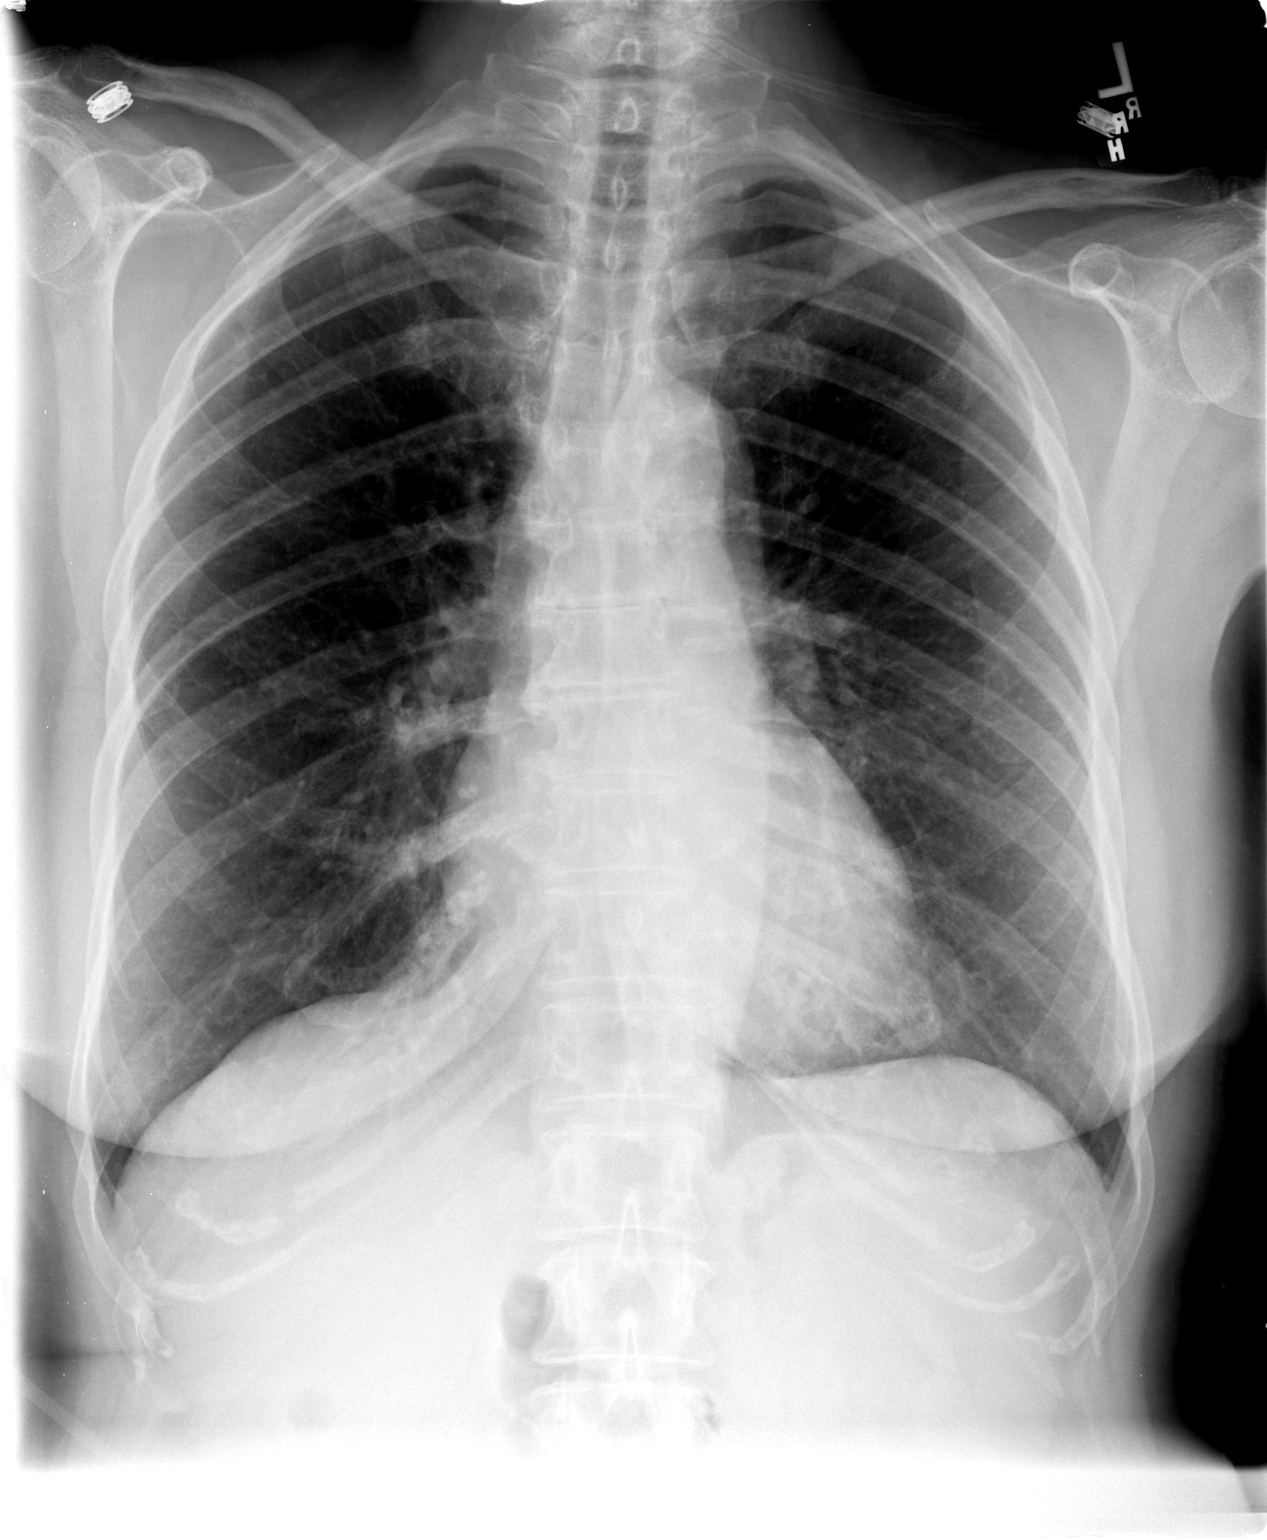

[view not recorded (2 of 2)]
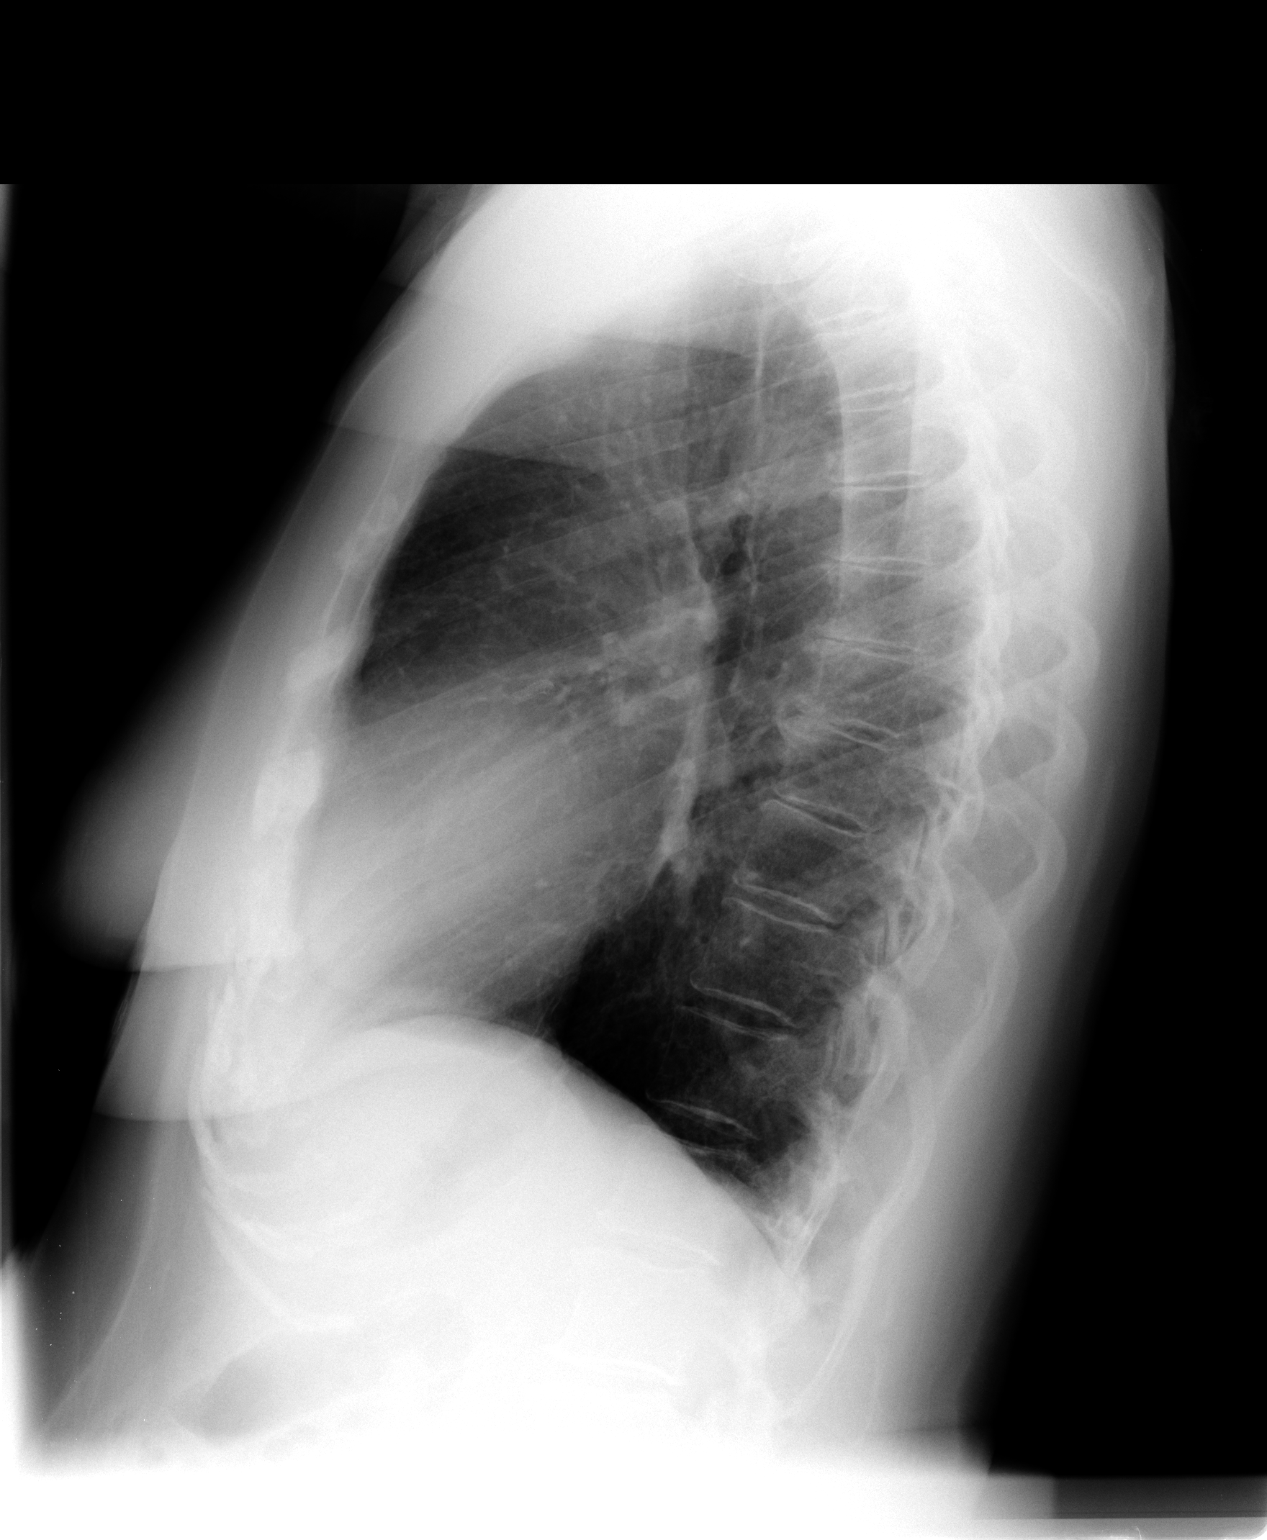

[2 of 2 positions shown; findings below may reference images not displayed]

FINDINGS: Mild hyperinflation of the lungs. Heart and mediastinal
contours are within normal limits.  No focal opacities or
effusions.  No acute bony abnormality.
IMPRESSION: Mild hyperinflation.  No active disease.

## 2011-09-11 IMAGING — CT CT HEAD W/ CM
1 series · 15 of 30 positions shown, 19 images · non-contrast
Comparison: None.

CLINICAL DATA: Presyncope.  Unsteady gait.  Hypertension.

CT HEAD WITHOUT CONTRAST 12/18/2010:
TECHNIQUE: Contiguous axial images were obtained from the base of
the skull through the vertex without intravenous contrast.

[Series 2: headseq 4.8 h37s · axial · 0.46mm/px · z∈[+95,+230]mm · 15 of 30 slices shown, 19 images]
[im 2/30  brain]
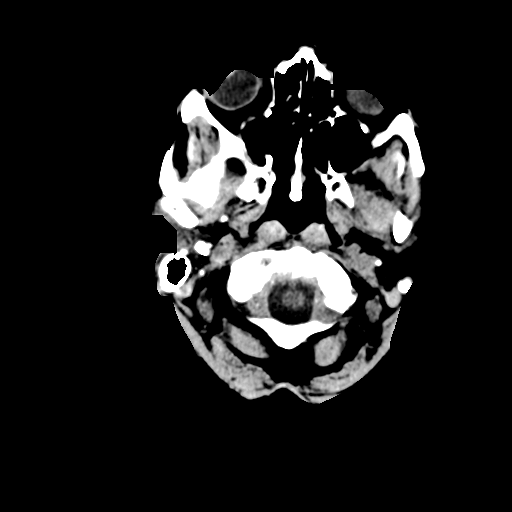
[im 2/30  bone]
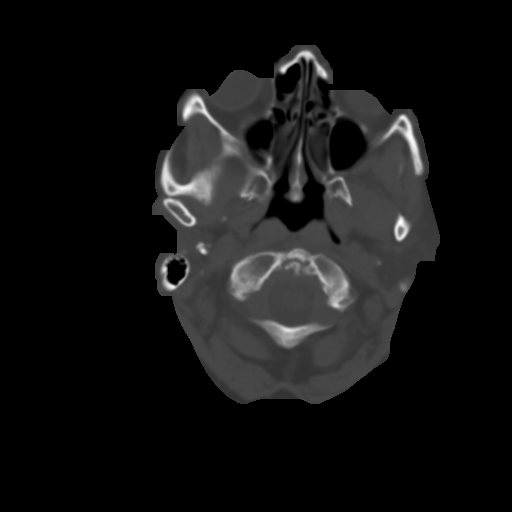
[im 4/30  brain]
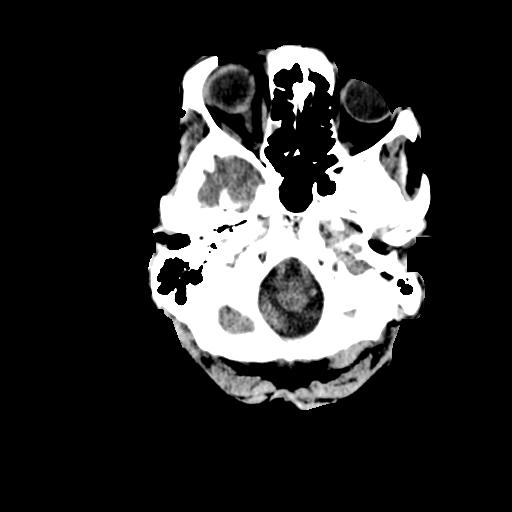
[im 6/30  brain]
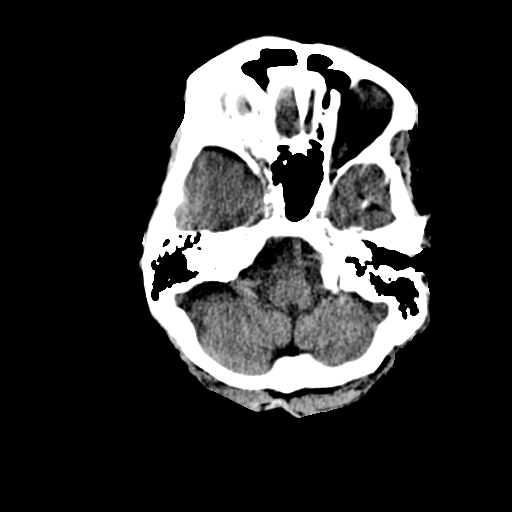
[im 8/30  brain]
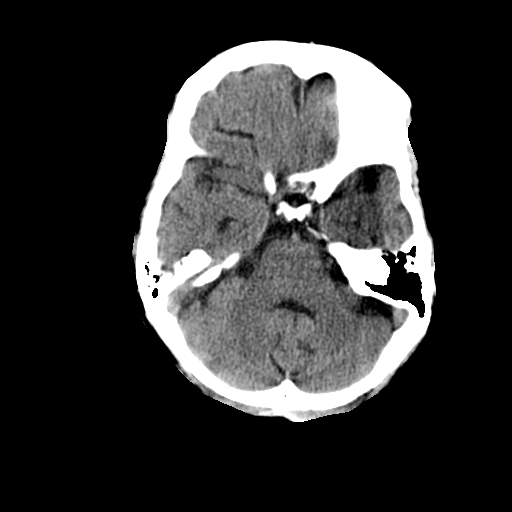
[im 10/30  brain]
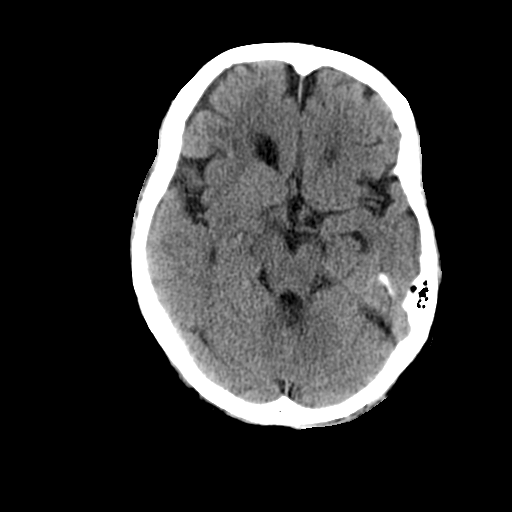
[im 10/30  bone]
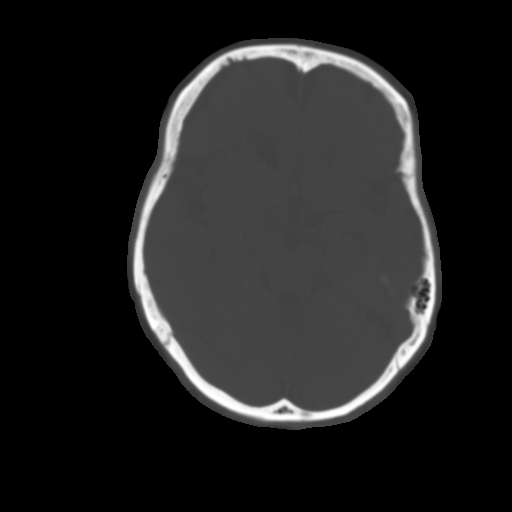
[im 12/30  brain]
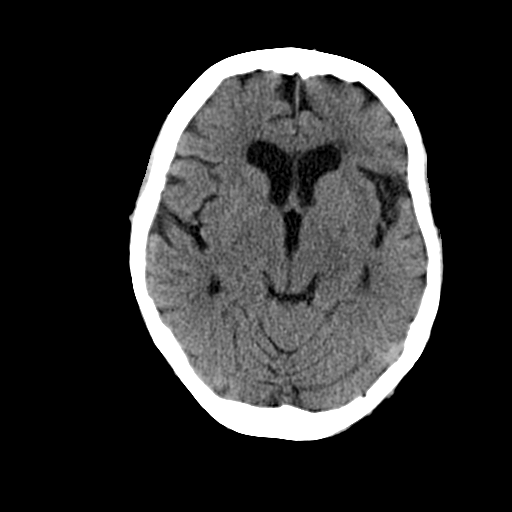
[im 14/30  brain]
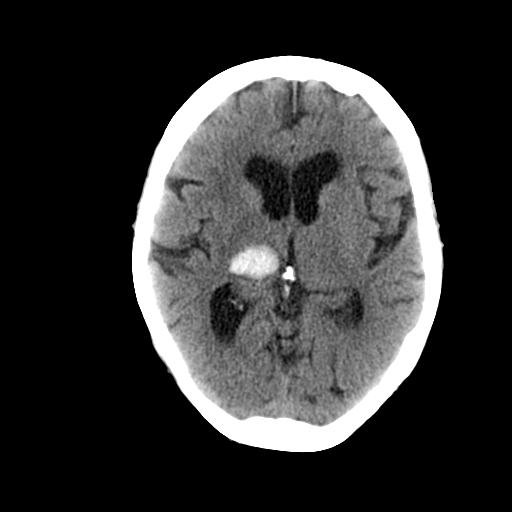
[im 16/30  brain]
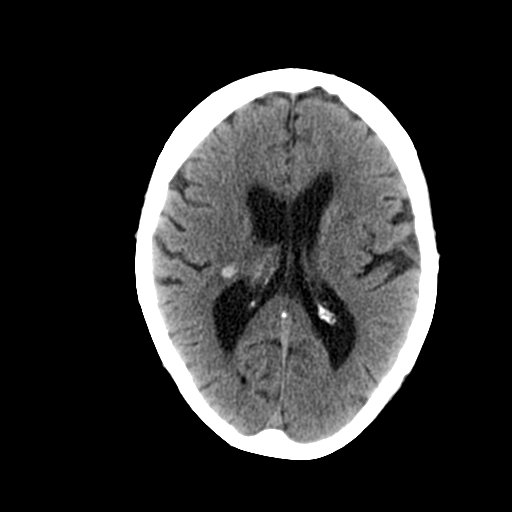
[im 17/30  brain]
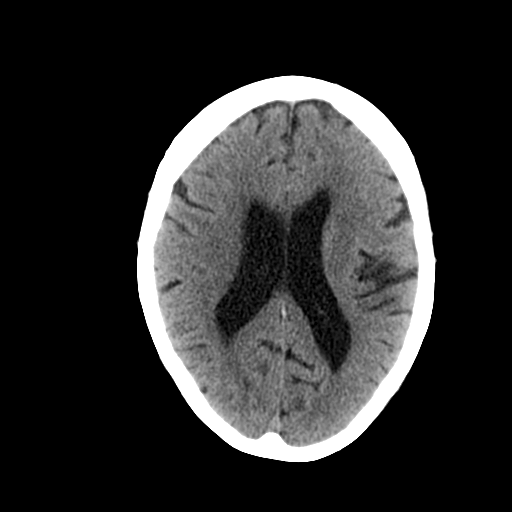
[im 17/30  bone]
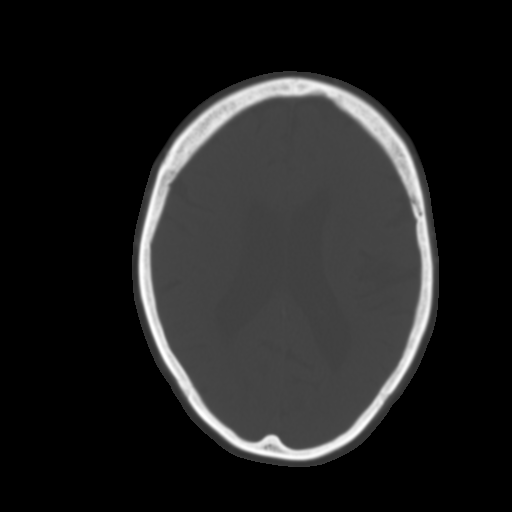
[im 19/30  brain]
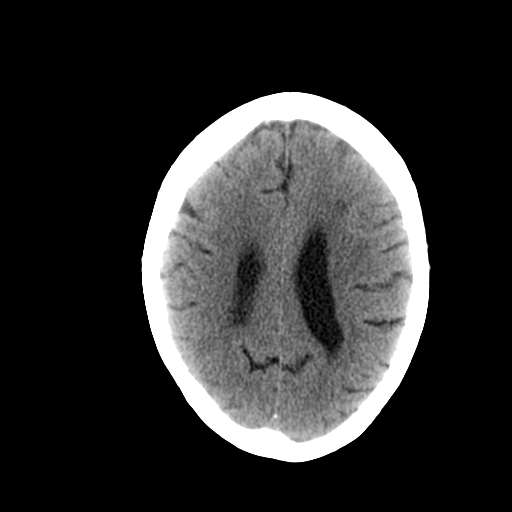
[im 21/30  brain]
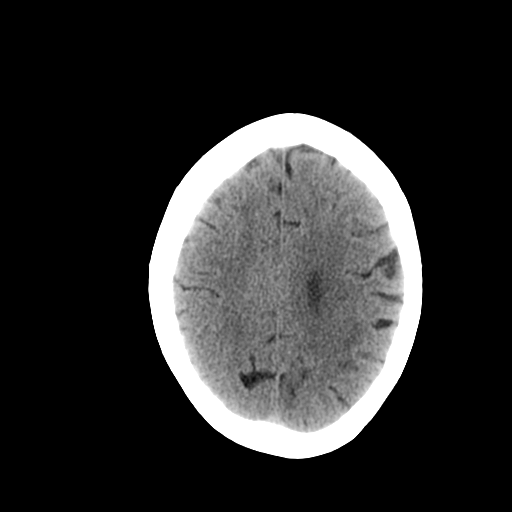
[im 23/30  brain]
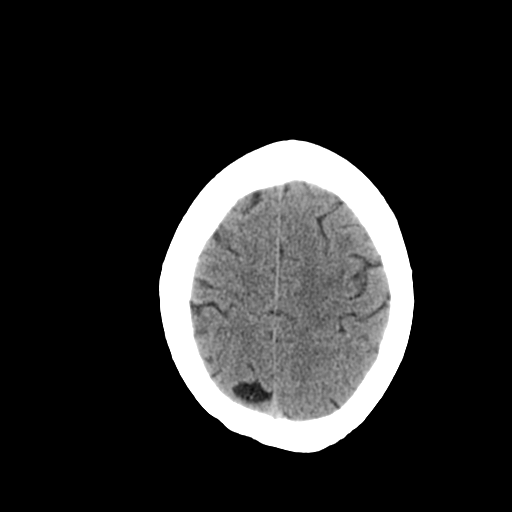
[im 25/30  brain]
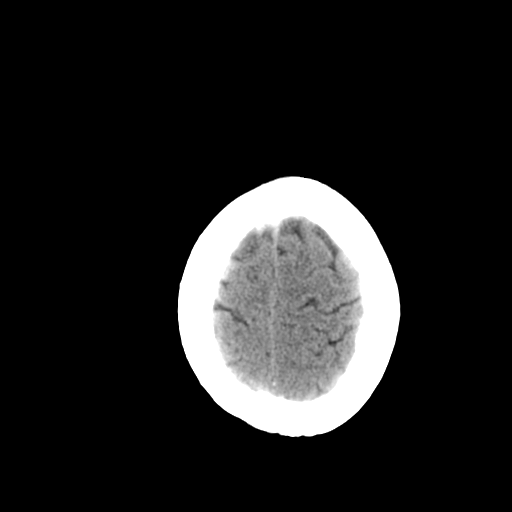
[im 25/30  bone]
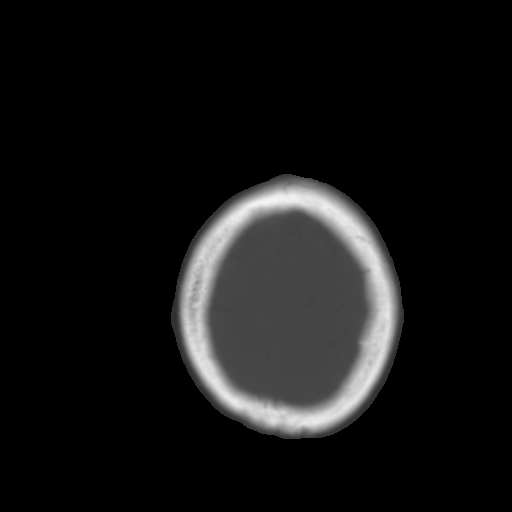
[im 27/30  brain]
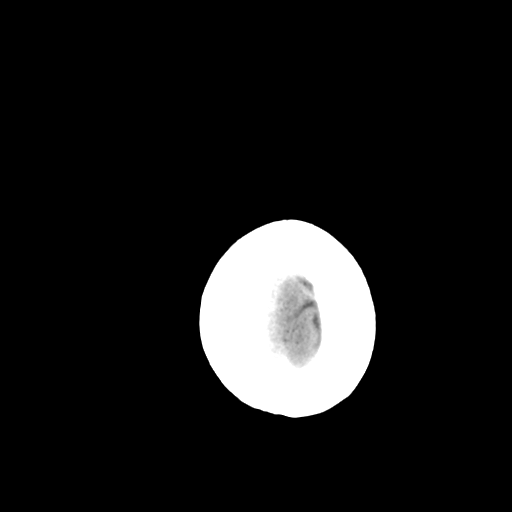
[im 29/30  brain]
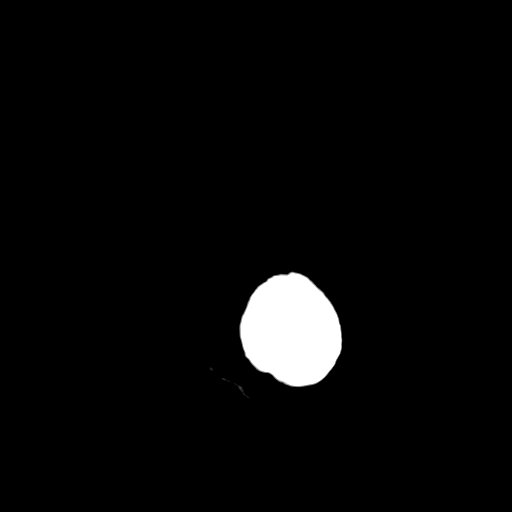

[15 of 30 positions shown; findings below may reference images not displayed]

FINDINGS: Acute hemorrhage/hematoma in the right thalamus and
basal ganglia measuring approximately 2.3 x 1.5 cm.  No associated
midline shift at this time.  No parenchymal hemorrhage elsewhere.
No intraventricular hemorrhage.  No extra-axial fluid collections.

Mild cortical atrophy consistent with age.  Ventricular system
normal in size appearance for age.  Mild changes of small vessel
disease of the white matter diffusely.  No mass lesion.

No focal osseous abnormality involving the skull.  Visualized
paranasal sinuses, mastoid air cells, and middle ear cavities well-
aerated.  Bilateral carotid siphon and vertebral artery
atherosclerosis.
IMPRESSION: 1.  Acute hypertensive hemorrhage/hemorrhagic stroke involving the
right thalamus and basal ganglia, measured above.  No associated
mass effect or midline shift.
2.  Mild age appropriate cortical atrophy and mild chronic
microvascular ischemic changes of the white matter diffusely.

Critical test results telephoned to Dr. Blain, the hospitalist
caring for the patient, by me at the time of interpretation on
12/18/2010 at 3683 hours.

## 2011-11-27 ENCOUNTER — Encounter: Payer: Self-pay | Admitting: Cardiology

## 2011-11-27 ENCOUNTER — Ambulatory Visit (INDEPENDENT_AMBULATORY_CARE_PROVIDER_SITE_OTHER): Payer: Medicare Other | Admitting: Cardiology

## 2011-11-27 VITALS — BP 158/71 | HR 53 | Resp 16 | Ht 62.0 in | Wt 142.0 lb

## 2011-11-27 DIAGNOSIS — I2510 Atherosclerotic heart disease of native coronary artery without angina pectoris: Secondary | ICD-10-CM

## 2011-11-27 DIAGNOSIS — I10 Essential (primary) hypertension: Secondary | ICD-10-CM

## 2011-11-27 DIAGNOSIS — E782 Mixed hyperlipidemia: Secondary | ICD-10-CM

## 2011-11-27 DIAGNOSIS — I6350 Cerebral infarction due to unspecified occlusion or stenosis of unspecified cerebral artery: Secondary | ICD-10-CM

## 2011-11-27 DIAGNOSIS — I639 Cerebral infarction, unspecified: Secondary | ICD-10-CM

## 2011-11-27 NOTE — Assessment & Plan Note (Signed)
Blood pressure trend is better based on home measurements. Continue present regimen.

## 2011-11-27 NOTE — Assessment & Plan Note (Signed)
Stable with mild residual left sided weakness.

## 2011-11-27 NOTE — Progress Notes (Signed)
Clinical Summary [name] is a 78 y.o.female presenting for followup. She was seen in November 2012. She is here with her son today.  She completed cardiac rehabilitation and has been exercising on her own, aerobics and some light strength training.  Labwork from February showed potassium, 4.6, BUN 11, creatinine 0.9, cholesterol 176, triglycerides 205, HDL 53, LDL 82, AST 21, ALT 17, TSH 2.1. I reviewed this with her today.  She reports no bleeding problems. Has tolerated the addition of Norvasc. Systolic blood pressure at home has been in the "120-130s."   Allergies  Allergen Reactions  . Macrodantin     Current Outpatient Prescriptions  Medication Sig Dispense Refill  . ALPRAZolam (XANAX) 0.25 MG tablet Take 0.25 mg by mouth at bedtime.        Marland Kitchen aspirin 81 MG tablet Take 81 mg by mouth daily.        . calcium citrate-vitamin D (CITRACAL+D) 315-200 MG-UNIT per tablet Take 1 tablet by mouth daily.        . citalopram (CELEXA) 10 MG tablet Take 10 mg by mouth daily.        . Coenzyme Q10 150 MG CAPS Take 150 mg by mouth daily.        . fluvastatin (LESCOL) 40 MG capsule Take 40 mg by mouth at bedtime.        . metoprolol succinate (TOPROL-XL) 25 MG 24 hr tablet Take 1 tablet (25 mg total) by mouth daily.  30 tablet  6  . mometasone (NASONEX) 50 MCG/ACT nasal spray Place 2 sprays into the nose as needed.        . Multiple Vitamins-Minerals (CENTRUM) tablet Take 1 tablet by mouth daily.       . nitroGLYCERIN (NITROSTAT) 0.4 MG SL tablet Place 0.4 mg under the tongue every 5 (five) minutes as needed.        . Omega-3 Fatty Acids (FISH OIL) 1000 MG CAPS Take 1,000 mg by mouth 2 (two) times daily.       . ramipril (ALTACE) 5 MG capsule Take 5 mg by mouth at bedtime. 2 tabs daily       . vitamin B-12 (CYANOCOBALAMIN) 1000 MCG tablet Take 1,000 mcg by mouth daily.        Marland Kitchen amLODipine (NORVASC) 2.5 MG tablet Take 1 tablet (2.5 mg total) by mouth daily.  30 tablet  12    Past Medical  History  Diagnosis Date  . Essential hypertension, benign   . Type 2 diabetes mellitus   . GERD (gastroesophageal reflux disease)   . Cataracts, bilateral   . Diverticulitis   . Allergic rhinitis   . Syncope     Neurally mediated  . Coronary atherosclerosis of native coronary artery     DES LAD 3/12, 99% nondominant RCA, No CAD otherwise, LVEF 65%  . Stroke, hemorrhagic     Right thalamic hemorrhage 4/12  . Diabetes mellitus   . Hyperlipidemia   . Stroke     Social History [name] reports that she quit smoking about 23 years ago. Her smoking use included Cigarettes. She smoked .25 packs per day. She has never used smokeless tobacco. [name] reports that she does not drink alcohol.  Review of Systems No palpitations or syncope. Stable appetite. No angina or progressive dyspnea. Otherwise negative.  Physical Examination Filed Vitals:   11/27/11 1257  BP: 158/71  Pulse: 53  Resp: 16    Normally nourished appearing elderly woman in no acute  distress.  HEENT: Conjunctiva and lids are normal, oropharynx with moist mucosa.  Neck: Supple, no elevated JVP or bruits, or thyromegaly.  Lungs: Clear to auscultation, nonlabored.  Cardiac: Regular rate and rhythm, no S3 or pericardial rub.  Abdomen: Soft, nontender, bowel sounds present.  Skin: Warm and dry.  Musculoskeletal: No kyphosis.  Extremities: No pitting edema.  Neuropsychiatric: Alert and oriented x3, affect appropriate. Still has some mild residual left sided weakness.    Problem List and Plan

## 2011-11-27 NOTE — Assessment & Plan Note (Signed)
Symptomatically stable on medical therapy. Continue regular exercise regimen. Followup arranged. 

## 2011-11-27 NOTE — Patient Instructions (Signed)
**Note De-identified  Obfuscation** Your physician recommends that you continue on your current medications as directed. Please refer to the Current Medication list given to you today.  Your physician recommends that you schedule a follow-up appointment in: 4 months  

## 2011-11-27 NOTE — Assessment & Plan Note (Signed)
LDL at goal on statin therapy. 

## 2011-12-10 ENCOUNTER — Other Ambulatory Visit: Payer: Self-pay | Admitting: Cardiology

## 2012-04-06 ENCOUNTER — Encounter: Payer: Self-pay | Admitting: Cardiology

## 2012-04-06 ENCOUNTER — Ambulatory Visit (INDEPENDENT_AMBULATORY_CARE_PROVIDER_SITE_OTHER): Payer: Medicare Other | Admitting: Cardiology

## 2012-04-06 VITALS — BP 160/60 | HR 59 | Wt 159.0 lb

## 2012-04-06 DIAGNOSIS — I2510 Atherosclerotic heart disease of native coronary artery without angina pectoris: Secondary | ICD-10-CM

## 2012-04-06 DIAGNOSIS — E782 Mixed hyperlipidemia: Secondary | ICD-10-CM

## 2012-04-06 DIAGNOSIS — I10 Essential (primary) hypertension: Secondary | ICD-10-CM

## 2012-04-06 NOTE — Progress Notes (Signed)
Clinical Summary [name] is a 78 y.o.female presenting for followup. I saw her in March. She states that she has been doing well, no angina or unusual shortness of breath. Still goes for a walk in the morning for exercise.  Followup ECG today shows sinus bradycardia, no significant changes.  She has not had a followup visit yet with Dr. Gerda Diss.  She also keeps a log of blood pressure at home, typically much better control.   Allergies  Allergen Reactions  . Macrodantin     Current Outpatient Prescriptions  Medication Sig Dispense Refill  . ALPRAZolam (XANAX) 0.25 MG tablet Take 0.25 mg by mouth at bedtime.        Marland Kitchen amLODipine (NORVASC) 2.5 MG tablet Take 1 tablet (2.5 mg total) by mouth daily.  30 tablet  12  . aspirin 81 MG tablet Take 81 mg by mouth daily.        . calcium citrate-vitamin D (CITRACAL+D) 315-200 MG-UNIT per tablet Take 1 tablet by mouth daily.        . citalopram (CELEXA) 10 MG tablet Take 10 mg by mouth daily.        . Coenzyme Q10 150 MG CAPS Take 150 mg by mouth daily.        . fluvastatin (LESCOL) 40 MG capsule Take 40 mg by mouth at bedtime.        . mometasone (NASONEX) 50 MCG/ACT nasal spray Place 2 sprays into the nose as needed.        . Multiple Vitamins-Minerals (CENTRUM) tablet Take 1 tablet by mouth daily.       . nitroGLYCERIN (NITROSTAT) 0.4 MG SL tablet Place 0.4 mg under the tongue every 5 (five) minutes as needed.        . Omega-3 Fatty Acids (FISH OIL) 1000 MG CAPS Take 1,000 mg by mouth 2 (two) times daily.       . ramipril (ALTACE) 5 MG capsule Take 5 mg by mouth at bedtime. 2 tabs daily       . TOPROL XL 25 MG 24 hr tablet TAKE (1) TABLET BY MOUTH AT BEDTIME.  30 each  6  . vitamin B-12 (CYANOCOBALAMIN) 1000 MCG tablet Take 1,000 mcg by mouth daily.          Past Medical History  Diagnosis Date  . Essential hypertension, benign   . Type 2 diabetes mellitus   . GERD (gastroesophageal reflux disease)   . Cataracts, bilateral   .  Diverticulitis   . Allergic rhinitis   . Syncope     Neurally mediated  . Coronary atherosclerosis of native coronary artery     DES LAD 3/12, 99% nondominant RCA, No CAD otherwise, LVEF 65%  . Stroke, hemorrhagic     Right thalamic hemorrhage 4/12  . Diabetes mellitus   . Hyperlipidemia   . Stroke     Social History [name] reports that she quit smoking about 23 years ago. Her smoking use included Cigarettes. She smoked .25 packs per day. She has never used smokeless tobacco. [name] reports that she does not drink alcohol.  Review of Systems Reports no bleeding problems, no dizziness or syncope. Stable appetite. No peripheral edema. Otherwise negative.  Physical Examination Filed Vitals:   04/06/12 1522  BP: 160/60  Pulse: 59    Normally nourished appearing elderly woman in no acute distress.  HEENT: Conjunctiva and lids are normal, oropharynx with moist mucosa.  Neck: Supple, no elevated JVP or bruits, or  thyromegaly.  Lungs: Clear to auscultation, nonlabored.  Cardiac: Regular rate and rhythm, no S3 or pericardial rub.  Abdomen: Soft, nontender, bowel sounds present.  Skin: Warm and dry.  Musculoskeletal: No kyphosis.  Extremities: No pitting edema.    Problem List and Plan   Coronary atherosclerosis of native coronary artery Symptomatically stable, doing well at this time. ECG is reviewed without significant change. Plan to continue medical therapy and observation.  ESSENTIAL HYPERTENSION, BENIGN No changes were made to her regimen today. Encouraged her to continue to follow this at home. If trend increases may need to adjust medication dose further.  MIXED HYPERLIPIDEMIA Last lipid numbers reviewed and looked good overall. She is due to followup with Dr. Gerda Diss.    Jonelle Sidle, M.D., F.A.C.C.

## 2012-04-06 NOTE — Patient Instructions (Addendum)
Your physician recommends that you schedule a follow-up appointment in: 6 months  

## 2012-04-06 NOTE — Assessment & Plan Note (Signed)
No changes were made to her regimen today. Encouraged her to continue to follow this at home. If trend increases may need to adjust medication dose further.

## 2012-04-06 NOTE — Assessment & Plan Note (Signed)
Last lipid numbers reviewed and looked good overall. She is due to followup with Dr. Gerda Diss.

## 2012-04-06 NOTE — Assessment & Plan Note (Signed)
Symptomatically stable, doing well at this time. ECG is reviewed without significant change. Plan to continue medical therapy and observation.

## 2012-07-14 ENCOUNTER — Other Ambulatory Visit: Payer: Self-pay | Admitting: Cardiology

## 2012-08-27 ENCOUNTER — Other Ambulatory Visit: Payer: Self-pay | Admitting: Cardiology

## 2012-08-28 NOTE — Telephone Encounter (Signed)
rx sent to pharmacy by e-script  

## 2012-10-13 ENCOUNTER — Encounter: Payer: Self-pay | Admitting: Adult Health

## 2012-10-13 ENCOUNTER — Ambulatory Visit (INDEPENDENT_AMBULATORY_CARE_PROVIDER_SITE_OTHER): Payer: Medicare Other | Admitting: Adult Health

## 2012-10-13 VITALS — BP 146/68 | HR 55 | Ht 62.0 in | Wt 156.0 lb

## 2012-10-13 DIAGNOSIS — I10 Essential (primary) hypertension: Secondary | ICD-10-CM

## 2012-10-13 DIAGNOSIS — R5383 Other fatigue: Secondary | ICD-10-CM

## 2012-10-13 DIAGNOSIS — I2510 Atherosclerotic heart disease of native coronary artery without angina pectoris: Secondary | ICD-10-CM

## 2012-10-13 DIAGNOSIS — E782 Mixed hyperlipidemia: Secondary | ICD-10-CM

## 2012-10-13 NOTE — Assessment & Plan Note (Signed)
Excellent control of BP She is on 3 medications, amlodipine, lisinopril and metoprolol. She asked about simplifying her regimen. Can consider discontinuing amlodipine and increasing ACE. She is reluctant to make any changes at this time.

## 2012-10-13 NOTE — Assessment & Plan Note (Signed)
Due to see Dr. Gerda Diss in the next couple of months. She has to make appointment. I will have lipids and  LFT's completed with results sent to him. CBC will also be done.

## 2012-10-13 NOTE — Assessment & Plan Note (Addendum)
Continue medical treatment with observation and risk management. She is asymptomatic with the exception of overall fatigue. She is on toprol XL which she takes at nighttime to decrease these symptoms. She is not active. I have encouraged her to walk at indoor mall and keep active to keep her stamina up and increase energy. She verbalizes understanding. May need to decrease BB if she is unable to tolerate this for continued fatigue. Check TSH with labs.

## 2012-10-13 NOTE — Progress Notes (Deleted)
Name: [name]    DOB: 04-May-1934  Age: 79 y.o.  MR#: [id number]       PCP:  Harlow Asa, MD      Insurance: @PAYORNAME @   CC:   No chief complaint on file.   VS BP 146/68  Pulse 55  Ht 5\' 2"  (1.575 m)  Wt 156 lb (70.761 kg)  BMI 28.53 kg/m2  Weights Current Weight  10/13/12 156 lb (70.761 kg)  04/06/12 159 lb (72.122 kg)  11/27/11 142 lb (64.411 kg)    Blood Pressure  BP Readings from Last 3 Encounters:  10/13/12 146/68  04/06/12 160/60  11/27/11 158/71     Admit date:  (Not on file) Last encounter with RMR:  Visit date not found   Allergy Allergies  Allergen Reactions  . Macrodantin     Current Outpatient Prescriptions  Medication Sig Dispense Refill  . ALPRAZolam (XANAX) 0.25 MG tablet Take 0.25 mg by mouth at bedtime.        Marland Kitchen aspirin 81 MG tablet Take 81 mg by mouth daily.        . calcium citrate-vitamin D (CITRACAL+D) 315-200 MG-UNIT per tablet Take 1 tablet by mouth every other day.       . citalopram (CELEXA) 10 MG tablet Take 20 mg by mouth daily.       . Coenzyme Q10 150 MG CAPS Take 150 mg by mouth every other day.       . fluvastatin (LESCOL) 40 MG capsule Take 40 mg by mouth at bedtime.        . mometasone (NASONEX) 50 MCG/ACT nasal spray Place 2 sprays into the nose as needed.        . Multiple Vitamins-Minerals (CENTRUM) tablet Take 1 tablet by mouth daily.       . nitroGLYCERIN (NITROSTAT) 0.4 MG SL tablet Place 0.4 mg under the tongue every 5 (five) minutes as needed.        . NORVASC 2.5 MG tablet TAKE ONE TABLET DAILY.  30 tablet  5  . Omega-3 Fatty Acids (FISH OIL) 1000 MG CAPS Take 1,000 mg by mouth daily.       . ramipril (ALTACE) 5 MG capsule Take 5 mg by mouth at bedtime. 2 tabs daily       . TOPROL XL 25 MG 24 hr tablet TAKE (1) TABLET BY MOUTH AT BEDTIME.  30 tablet  6   No current facility-administered medications for this visit.    Discontinued Meds:    Medications Discontinued During This Encounter  Medication Reason  .  vitamin B-12 (CYANOCOBALAMIN) 1000 MCG tablet Error    Patient Active Problem List  Diagnosis  . MIXED HYPERLIPIDEMIA  . ESSENTIAL HYPERTENSION, BENIGN  . GERD  . Coronary atherosclerosis of native coronary artery  . Stroke    LABS No visits with results within 3 Month(s) from this visit. Latest known visit with results is:  Admission on 12/19/2010, Discharged on 12/21/2010  Component Date Value  . Glucose-Capillary 12/19/2010 129*  . WBC 12/20/2010 7.3   . RBC 12/20/2010 4.33   . Hemoglobin 12/20/2010 13.1   . HCT 12/20/2010 38.5   . MCV 12/20/2010 88.9   . [facility] 12/20/2010 30.3   . MCHC 12/20/2010 34.0   . RDW 12/20/2010 12.6   . Platelets 12/20/2010 248   . Sodium 12/20/2010 135   . Potassium 12/20/2010 4.0   . Chloride 12/20/2010 104   . CO2 12/20/2010 24   . Glucose, Bld  12/20/2010 123*  . BUN 12/20/2010 14   . Creatinine, Ser 12/20/2010 1.02   . Calcium 12/20/2010 9.4   . GFR calc non Af Amer 12/20/2010 53*  . GFR calc Af Amer 12/20/2010                     Value:>60                                The eGFR has been calculated                         using the MDRD equation.                         This calculation has not been                         validated in all clinical                         situations.                         eGFR's persistently                         <60 mL/min signify                         possible Chronic Kidney Disease.  . Cholesterol 12/20/2010                     Value:192                                ATP III CLASSIFICATION:                          <200     mg/dL   Desirable                          200-239  mg/dL   Borderline High                          >=240    mg/dL   High                                 . Triglycerides 12/20/2010 170*  . HDL 12/20/2010 50   . Total CHOL/HDL Ratio 12/20/2010 3.8   . VLDL 12/20/2010 34   . LDL Cholesterol 12/20/2010 *                   Value:108                                 Total Cholesterol/HDL:CHD Risk                         Coronary Heart Disease Risk Table  Men   Women                          1/2 Average Risk   3.4   3.3                          Average Risk       5.0   4.4                          2 X Average Risk   9.6   7.1                          3 X Average Risk  23.4   11.0                                                        Use the calculated Patient Ratio                         above and the CHD Risk Table                         to determine the patient's CHD Risk.                                                        ATP III CLASSIFICATION (LDL):                          <100     mg/dL   Optimal                          100-129  mg/dL   Near or Above                                            Optimal                          130-159  mg/dL   Borderline                          160-189  mg/dL   High                          >190     mg/dL   Very High  . Magnesium 12/20/2010 2.0   . Phosphorus 12/20/2010 4.8*  . Glucose-Capillary 12/19/2010 160*  . Comment 1 12/19/2010 Documented in Chart   . Comment 2 12/19/2010 Notify RN   . Glucose-Capillary 12/20/2010 130*  . Comment 1 12/20/2010 Documented in Chart   . Comment 2 12/20/2010 Notify RN   . Glucose-Capillary 12/20/2010 167*  . Comment 1 12/20/2010 Notify RN   .  Comment 2 12/20/2010 Documented in Chart   . Hemoglobin A1C 12/20/2010 *                   Value:6.0                         (NOTE)                                                                       According to the ADA Clinical Practice Recommendations for 2011, when HbA1c is used as a screening test:   >=6.5%   Diagnostic of Diabetes Mellitus           (if abnormal result                          is confirmed)  5.7-6.4%   Increased risk of developing Diabetes Mellitus  References:Diagnosis and Classification of Diabetes Mellitus,Diabetes Care,2011,34(Suppl 1):S62-S69 and  Standards of Medical Care in         Diabetes - 2011,Diabetes Care,2011,34                          (Suppl 1):S11-S61.  . Mean Plasma Glucose 12/20/2010 126*  . Glucose-Capillary 12/20/2010 145*  . Comment 1 12/20/2010 Notify RN   . Comment 2 12/20/2010 Documented in Chart   . Glucose-Capillary 12/20/2010 148*  . Comment 1 12/20/2010 Notify RN   . Comment 2 12/20/2010 Documented in Chart   . Glucose-Capillary 12/21/2010 98   . Glucose-Capillary 12/21/2010 147*  . Comment 1 12/21/2010 Notify RN   . Comment 2 12/21/2010 Documented in Chart      Results for this Opt Visit:     Results for orders placed during the hospital encounter of 12/19/10  GLUCOSE, CAPILLARY      Result Value Range   Glucose-Capillary 129 (*) 70 - 99 mg/dL  CBC      Result Value Range   WBC 7.3  4.0 - 10.5 K/uL   RBC 4.33  3.87 - 5.11 MIL/uL   Hemoglobin 13.1  12.0 - 15.0 g/dL   HCT 16.1  09.6 - 04.5 %   MCV 88.9  78.0 - 100.0 fL   MCH 30.3  26.0 - 34.0 pg   MCHC 34.0  30.0 - 36.0 g/dL   RDW 40.9  81.1 - 91.4 %   Platelets 248  150 - 400 K/uL  BASIC METABOLIC PANEL      Result Value Range   Sodium 135  135 - 145 mEq/L   Potassium 4.0  3.5 - 5.1 mEq/L   Chloride 104  96 - 112 mEq/L   CO2 24  19 - 32 mEq/L   Glucose, Bld 123 (*) 70 - 99 mg/dL   BUN 14  6 - 23 mg/dL   Creatinine, Ser 7.82  0.4 - 1.2 mg/dL   Calcium 9.4  8.4 - 95.6 mg/dL   GFR calc non Af Amer 53 (*) >60 mL/min   GFR calc Af Amer    >60 mL/min   Value: >60            The eGFR has been calculated  using the MDRD equation.     This calculation has not been     validated in all clinical     situations.     eGFR's persistently     <60 mL/min signify     possible Chronic Kidney Disease.  LIPID PANEL      Result Value Range   Cholesterol    0 - 200 mg/dL   Value: 540            ATP III CLASSIFICATION:      <200     mg/dL   Desirable      981-191  mg/dL   Borderline High      >=240    mg/dL   High              Triglycerides 170  (*) <150 mg/dL   HDL 50  >47 mg/dL   Total CHOL/HDL Ratio 3.8     VLDL 34  0 - 40 mg/dL   LDL Cholesterol   (*) 0 - 99 mg/dL   Value: 829            Total Cholesterol/HDL:CHD Risk     Coronary Heart Disease Risk Table                         Men   Women      1/2 Average Risk   3.4   3.3      Average Risk       5.0   4.4      2 X Average Risk   9.6   7.1      3 X Average Risk  23.4   11.0                Use the calculated Patient Ratio     above and the CHD Risk Table     to determine the patient's CHD Risk.                ATP III CLASSIFICATION (LDL):      <100     mg/dL   Optimal      562-130  mg/dL   Near or Above                        Optimal      130-159  mg/dL   Borderline      865-784  mg/dL   High      >696     mg/dL   Very High  MAGNESIUM      Result Value Range   Magnesium 2.0  1.5 - 2.5 mg/dL  PHOSPHORUS      Result Value Range   Phosphorus 4.8 (*) 2.3 - 4.6 mg/dL  GLUCOSE, CAPILLARY      Result Value Range   Glucose-Capillary 160 (*) 70 - 99 mg/dL   Comment 1 Documented in Chart     Comment 2 Notify RN    GLUCOSE, CAPILLARY      Result Value Range   Glucose-Capillary 130 (*) 70 - 99 mg/dL   Comment 1 Documented in Chart     Comment 2 Notify RN    GLUCOSE, CAPILLARY      Result Value Range   Glucose-Capillary 167 (*) 70 - 99 mg/dL   Comment 1 Notify RN     Comment 2 Documented in Chart    HEMOGLOBIN A1C      Result Value  Range   Hemoglobin A1C   (*) <5.7 %   Value: 6.0     (NOTE)                                                                       According to the ADA Clinical Practice Recommendations for 2011, when HbA1c is used as a screening test:   >=6.5%   Diagnostic of Diabetes Mellitus           (if abnormal result      is confirmed)  5.7-6.4%   Increased risk of developing Diabetes Mellitus  References:Diagnosis and Classification of Diabetes Mellitus,Diabetes Care,2011,34(Suppl 1):S62-S69 and Standards of Medical Care in         Diabetes -  2011,Diabetes Care,2011,34      (Suppl 1):S11-S61.   Mean Plasma Glucose 126 (*) <117 mg/dL  GLUCOSE, CAPILLARY      Result Value Range   Glucose-Capillary 145 (*) 70 - 99 mg/dL   Comment 1 Notify RN     Comment 2 Documented in Chart    GLUCOSE, CAPILLARY      Result Value Range   Glucose-Capillary 148 (*) 70 - 99 mg/dL   Comment 1 Notify RN     Comment 2 Documented in Chart    GLUCOSE, CAPILLARY      Result Value Range   Glucose-Capillary 98  70 - 99 mg/dL  GLUCOSE, CAPILLARY      Result Value Range   Glucose-Capillary 147 (*) 70 - 99 mg/dL   Comment 1 Notify RN     Comment 2 Documented in Chart      EKG Orders placed in visit on 10/13/12  . EKG 12-LEAD     Prior Assessment and Plan Problem List as of 10/13/2012     ICD-9-CM     Cardiology Problems   MIXED HYPERLIPIDEMIA   Last Assessment & Plan   04/06/2012 Office Visit Written 04/06/2012  3:46 PM by Jonelle Sidle, MD     Last lipid numbers reviewed and looked good overall. She is due to followup with Dr. Gerda Diss.    ESSENTIAL HYPERTENSION, BENIGN   Last Assessment & Plan   04/06/2012 Office Visit Written 04/06/2012  3:45 PM by Jonelle Sidle, MD     No changes were made to her regimen today. Encouraged her to continue to follow this at home. If trend increases may need to adjust medication dose further.    Coronary atherosclerosis of native coronary artery   Last Assessment & Plan   04/06/2012 Office Visit Written 04/06/2012  3:45 PM by Jonelle Sidle, MD     Symptomatically stable, doing well at this time. ECG is reviewed without significant change. Plan to continue medical therapy and observation.    Stroke   Last Assessment & Plan   11/27/2011 Office Visit Written 11/27/2011  1:27 PM by Jonelle Sidle, MD     Stable with mild residual left sided weakness.      Other   GERD       Imaging: No results found.   FRS Calculation: Score not calculated. Missing: Total Cholesterol

## 2012-10-13 NOTE — Progress Notes (Signed)
HPI: [name] is a 79 y/o patient of Dr. Diona Browner we are seeing for ongoing assessment and treatment of CAD,hypertension, mixed hyperlipidemia, with history of CVA with mild residual weakness on the left, and GERD. She comes today with complaints of generalized fatigue.She has not had any hospitalizations or ER visits since last office evaluation. She denies chest pain or DOE. She is medically complaint. She is not active lately due to winter weather, but usually walks outside on a daily basis when the weather is good.  Allergies  Allergen Reactions  . Macrodantin     Current Outpatient Prescriptions  Medication Sig Dispense Refill  . ALPRAZolam (XANAX) 0.25 MG tablet Take 0.25 mg by mouth at bedtime.        Marland Kitchen aspirin 81 MG tablet Take 81 mg by mouth daily.        . calcium citrate-vitamin D (CITRACAL+D) 315-200 MG-UNIT per tablet Take 1 tablet by mouth every other day.       . citalopram (CELEXA) 10 MG tablet Take 20 mg by mouth daily.       . Coenzyme Q10 150 MG CAPS Take 150 mg by mouth every other day.       . fluvastatin (LESCOL) 40 MG capsule Take 40 mg by mouth at bedtime.        . mometasone (NASONEX) 50 MCG/ACT nasal spray Place 2 sprays into the nose as needed.        . Multiple Vitamins-Minerals (CENTRUM) tablet Take 1 tablet by mouth daily.       . nitroGLYCERIN (NITROSTAT) 0.4 MG SL tablet Place 0.4 mg under the tongue every 5 (five) minutes as needed.        . NORVASC 2.5 MG tablet TAKE ONE TABLET DAILY.  30 tablet  5  . Omega-3 Fatty Acids (FISH OIL) 1000 MG CAPS Take 1,000 mg by mouth daily.       . ramipril (ALTACE) 5 MG capsule Take 5 mg by mouth at bedtime. 2 tabs daily       . TOPROL XL 25 MG 24 hr tablet TAKE (1) TABLET BY MOUTH AT BEDTIME.  30 tablet  6   No current facility-administered medications for this visit.    Past Medical History  Diagnosis Date  . Essential hypertension, benign   . Type 2 diabetes mellitus   . GERD (gastroesophageal reflux disease)    . Cataracts, bilateral   . Diverticulitis   . Allergic rhinitis   . Syncope     Neurally mediated  . Coronary atherosclerosis of native coronary artery     DES LAD 3/12, 99% nondominant RCA, No CAD otherwise, LVEF 65%  . Stroke, hemorrhagic     Right thalamic hemorrhage 4/12  . Diabetes mellitus   . Hyperlipidemia   . Stroke     Past Surgical History  Procedure Laterality Date  . Breast cyst excision    . Cardiac catheterization    . Carotid stent  11/16/10    RUE:AVWUJW of systems complete and found to be negative unless listed above  PHYSICAL EXAM BP 146/68  Pulse 55  Ht 5\' 2"  (1.575 m)  Wt 156 lb (70.761 kg)  BMI 28.53 kg/m2  General: Well developed, well nourished, in no acute distress Head: Eyes PERRLA, No xanthomas.   Normal cephalic and atramatic  Lungs: Clear bilaterally to auscultation and percussion. Heart: HRRR S1 S2, with soft systolic murmur.Pulses are 2+ & equal.  No carotid bruit. No JVD.  No abdominal bruits. No femoral bruits. Abdomen: Bowel sounds are positive, abdomen soft and non-tender without masses or                  Hernia's noted. Msk:  Back normal, normal gait. Normal strength and tone for age. Extremities: No clubbing, cyanosis or edema.  DP +1 Neuro: Alert and oriented X 3. Psych:  Good affect, responds appropriately  EKG: NSR rate of 55 bpm.  ASSESSMENT AND PLAN

## 2012-10-13 NOTE — Patient Instructions (Addendum)
Your physician recommends that you schedule a follow-up appointment in: 6 MONTHS  Your physician recommends that you return for lab work in: WITHIN THE WEEK

## 2012-10-20 LAB — CBC
HCT: 38.9 % (ref 36.0–46.0)
Hemoglobin: 13.1 g/dL (ref 12.0–15.0)
MCH: 30.6 pg (ref 26.0–34.0)
MCHC: 33.7 g/dL (ref 30.0–36.0)
MCV: 90.9 fL (ref 78.0–100.0)
Platelets: 256 K/uL (ref 150–400)
RBC: 4.28 MIL/uL (ref 3.87–5.11)
RDW: 12.9 % (ref 11.5–15.5)
WBC: 5.3 K/uL (ref 4.0–10.5)

## 2012-10-20 LAB — COMPREHENSIVE METABOLIC PANEL
AST: 19 U/L (ref 0–37)
Alkaline Phosphatase: 74 U/L (ref 39–117)
BUN: 14 mg/dL (ref 6–23)
Creat: 0.96 mg/dL (ref 0.50–1.10)
Glucose, Bld: 102 mg/dL — ABNORMAL HIGH (ref 70–99)
Potassium: 4.7 mEq/L (ref 3.5–5.3)
Total Bilirubin: 0.6 mg/dL (ref 0.3–1.2)

## 2012-10-20 LAB — LIPID PANEL
Cholesterol: 166 mg/dL (ref 0–200)
HDL: 54 mg/dL (ref >39)
LDL Cholesterol: 68 mg/dL (ref 0–99)
Total CHOL/HDL Ratio: 3.1 ratio
Triglycerides: 218 mg/dL — ABNORMAL HIGH (ref <150)
VLDL: 44 mg/dL — ABNORMAL HIGH (ref 0–40)

## 2012-10-21 ENCOUNTER — Encounter: Payer: Self-pay | Admitting: *Deleted

## 2013-01-02 ENCOUNTER — Encounter: Payer: Self-pay | Admitting: *Deleted

## 2013-01-22 ENCOUNTER — Other Ambulatory Visit: Payer: Self-pay | Admitting: Family Medicine

## 2013-02-01 ENCOUNTER — Other Ambulatory Visit: Payer: Self-pay | Admitting: Cardiovascular Disease

## 2013-02-01 ENCOUNTER — Other Ambulatory Visit: Payer: Self-pay | Admitting: Family Medicine

## 2013-02-15 ENCOUNTER — Other Ambulatory Visit: Payer: Self-pay | Admitting: Cardiology

## 2013-02-15 ENCOUNTER — Other Ambulatory Visit: Payer: Self-pay | Admitting: Family Medicine

## 2013-02-15 NOTE — Telephone Encounter (Signed)
Medication sent via escribe.  

## 2013-03-03 ENCOUNTER — Other Ambulatory Visit: Payer: Self-pay | Admitting: Nurse Practitioner

## 2013-03-03 ENCOUNTER — Telehealth: Payer: Self-pay | Admitting: Nurse Practitioner

## 2013-03-03 DIAGNOSIS — I10 Essential (primary) hypertension: Secondary | ICD-10-CM

## 2013-03-03 DIAGNOSIS — E785 Hyperlipidemia, unspecified: Secondary | ICD-10-CM

## 2013-03-03 DIAGNOSIS — R5383 Other fatigue: Secondary | ICD-10-CM

## 2013-03-03 DIAGNOSIS — Z79899 Other long term (current) drug therapy: Secondary | ICD-10-CM

## 2013-03-03 DIAGNOSIS — E119 Type 2 diabetes mellitus without complications: Secondary | ICD-10-CM

## 2013-03-03 NOTE — Telephone Encounter (Signed)
bw papers for appt on 7/15

## 2013-03-10 LAB — BASIC METABOLIC PANEL
Chloride: 100 mEq/L (ref 96–112)
Creat: 1.1 mg/dL (ref 0.50–1.10)

## 2013-03-10 LAB — HEPATIC FUNCTION PANEL
AST: 21 U/L (ref 0–37)
Bilirubin, Direct: 0.1 mg/dL (ref 0.0–0.3)
Total Bilirubin: 0.7 mg/dL (ref 0.3–1.2)

## 2013-03-10 LAB — LIPID PANEL
Cholesterol: 192 mg/dL (ref 0–200)
HDL: 54 mg/dL (ref >39)
Total CHOL/HDL Ratio: 3.6 Ratio
Triglycerides: 223 mg/dL — ABNORMAL HIGH (ref <150)
VLDL: 45 mg/dL — ABNORMAL HIGH (ref 0–40)

## 2013-03-10 LAB — HEMOGLOBIN A1C: Hgb A1c MFr Bld: 5.7 % — ABNORMAL HIGH (ref <5.7)

## 2013-03-11 LAB — MICROALBUMIN, URINE: Microalb, Ur: 2.18 mg/dL — ABNORMAL HIGH (ref 0.00–1.89)

## 2013-03-16 ENCOUNTER — Ambulatory Visit (INDEPENDENT_AMBULATORY_CARE_PROVIDER_SITE_OTHER): Payer: Medicare Other | Admitting: Nurse Practitioner

## 2013-03-16 ENCOUNTER — Encounter: Payer: Self-pay | Admitting: Nurse Practitioner

## 2013-03-16 ENCOUNTER — Telehealth: Payer: Self-pay | Admitting: Family Medicine

## 2013-03-16 VITALS — BP 160/72 | HR 90 | Wt 156.8 lb

## 2013-03-16 DIAGNOSIS — I10 Essential (primary) hypertension: Secondary | ICD-10-CM

## 2013-03-16 DIAGNOSIS — E11319 Type 2 diabetes mellitus with unspecified diabetic retinopathy without macular edema: Secondary | ICD-10-CM | POA: Insufficient documentation

## 2013-03-16 DIAGNOSIS — E1149 Type 2 diabetes mellitus with other diabetic neurological complication: Secondary | ICD-10-CM | POA: Insufficient documentation

## 2013-03-16 DIAGNOSIS — R5383 Other fatigue: Secondary | ICD-10-CM

## 2013-03-16 DIAGNOSIS — E119 Type 2 diabetes mellitus without complications: Secondary | ICD-10-CM | POA: Insufficient documentation

## 2013-03-16 DIAGNOSIS — R5381 Other malaise: Secondary | ICD-10-CM

## 2013-03-16 DIAGNOSIS — G479 Sleep disorder, unspecified: Secondary | ICD-10-CM

## 2013-03-16 MED ORDER — CITALOPRAM HYDROBROMIDE 20 MG PO TABS: ORAL_TABLET | ORAL | Status: DC

## 2013-03-16 NOTE — Telephone Encounter (Signed)
Patient would like for [place] to send a copy of her BW to her heart doctor, Dr. Diona Browner

## 2013-03-16 NOTE — Progress Notes (Signed)
Subjective:  Presents for routine followup. Has had a slight decrease in her energy. Continues to walk at least 1 mile a day 5 days per week sometimes more. Also an active lifestyle including cleaning her house a regular basis. Some mild short-term memory loss, no evidence of dementia or confusion. Denies any increase in her stress level. No TIA symptoms such as difficulty speaking or swallowing or numbness/weakness of the face arms or legs. No headache. No dizziness. Feels "weak in the knees at times". No chest pain shortness of breath or edema. No unusual cough or orthopnea. Last visit with cardiology was in February. Trouble sleeping at times, usually better with taking Xanax.  Objective:   BP 160/72  Pulse 90  Wt 156 lb 12.8 oz (71.124 kg)  BMI 28.67 kg/m2 NAD. Alert, oriented. Thyroid normal limit to palpation and nontender. Lungs clear. Heart regular rate rhythm with soft grade II/VI systolic murmur loudest at PMI. Carotids no bruits or thrills. Lower extremities no edema. BP on recheck 160/72 right arm sitting. Patient given a copy of recent lab work, discussed during office visit.  Assessment:Essential hypertension, benign  Other malaise and fatigue  Sleep disturbance  fatigue is likely multifactorial considering history of depression, age and possible side effects of medications Plan: Increase Celexa 20 mg to one and a half tabs daily. Patient given written and verbal information on instructions. Her son send note to the office today. Advised him of changes. Also notes given for all instructions. Continue current medications. Recommend rechecking BP outside the office and call back if it remains elevated. Routine followup in 3 months.

## 2013-03-16 NOTE — Telephone Encounter (Signed)
FYI she says this is something we normally do for her

## 2013-03-16 NOTE — Assessment & Plan Note (Signed)
BP 160/72 today. Continue current medications. Recommend checking BP outside office and call back if it remains elevated.

## 2013-04-06 ENCOUNTER — Other Ambulatory Visit: Payer: Self-pay | Admitting: Family Medicine

## 2013-04-06 NOTE — Telephone Encounter (Signed)
Ok times 4 total 

## 2013-04-21 ENCOUNTER — Ambulatory Visit (INDEPENDENT_AMBULATORY_CARE_PROVIDER_SITE_OTHER): Payer: Medicare Other | Admitting: Cardiology

## 2013-04-21 ENCOUNTER — Encounter: Payer: Self-pay | Admitting: Cardiology

## 2013-04-21 VITALS — BP 158/76 | HR 61 | Ht 62.0 in | Wt 156.0 lb

## 2013-04-21 DIAGNOSIS — I10 Essential (primary) hypertension: Secondary | ICD-10-CM

## 2013-04-21 DIAGNOSIS — E782 Mixed hyperlipidemia: Secondary | ICD-10-CM

## 2013-04-21 DIAGNOSIS — I2510 Atherosclerotic heart disease of native coronary artery without angina pectoris: Secondary | ICD-10-CM

## 2013-04-21 NOTE — Progress Notes (Signed)
Clinical Summary [name] is a 79 y.o.female last seen in February by Ms. Lawrence NP. She is here with her son today. Continues to do well, no angina symptoms, no nitroglycerin use. Still trying to exercise regularly. States that she has had some trouble with her memory, otherwise no specific complaints.  Recent lab work reviewed with cholesterol 192, triglycerides 223, HDL 54, LDL 93, creatinine 1.1, potassium 4.9, AST 21, ALT 24.  ECG today shows sinus bradycardia.  Allergies  Allergen Reactions  . Macrodantin   . Pravachol [Pravastatin Sodium]   . Tetanus Toxoids     Current Outpatient Prescriptions  Medication Sig Dispense Refill  . ALPRAZolam (XANAX) 0.25 MG tablet TAKE ONE TABLET DAILY AS NEEDED.  30 tablet  3  . amLODipine (NORVASC) 2.5 MG tablet TAKE ONE TABLET DAILY.  30 tablet  2  . aspirin 81 MG tablet Take 81 mg by mouth daily.        . calcium citrate-vitamin D (CITRACAL+D) 315-200 MG-UNIT per tablet Take 1 tablet by mouth every other day.       Marland Kitchen CINNAMON PO Take by mouth daily.      . citalopram (CELEXA) 20 MG tablet Take 1 1/2 tabs po qd  45 tablet  2  . COCONUT OIL PO Take by mouth 2 (two) times daily.      . Coenzyme Q10 150 MG CAPS Take 150 mg by mouth every other day.       . fluvastatin (LESCOL) 40 MG capsule TAKE (1) CAPSULE BY MOUTH EVERY DAY FOR INCREASED CHOLESTEROL.  30 capsule  5  . metoprolol succinate (TOPROL-XL) 25 MG 24 hr tablet TAKE (1) TABLET BY MOUTH AT BEDTIME.  30 tablet  6  . mometasone (NASONEX) 50 MCG/ACT nasal spray Place 2 sprays into the nose as needed.        . Multiple Vitamins-Minerals (CENTRUM) tablet Take 1 tablet by mouth daily.       . nitroGLYCERIN (NITROSTAT) 0.4 MG SL tablet Place 0.4 mg under the tongue every 5 (five) minutes as needed.        . Omega-3 Fatty Acids (FISH OIL) 1000 MG CAPS Take 1,000 mg by mouth daily.       . Psyllium (METAMUCIL PO) Take by mouth daily.      . ramipril (ALTACE) 5 MG capsule TAKE 1 CAPSULE  BY MOUTH TWICE DAILY.  60 capsule  5  . TURMERIC PO Take by mouth 2 (two) times daily.       No current facility-administered medications for this visit.    Past Medical History  Diagnosis Date  . Essential hypertension, benign   . Type 2 diabetes mellitus   . GERD (gastroesophageal reflux disease)   . Cataracts, bilateral   . Diverticulitis   . Allergic rhinitis   . Syncope     Neurally mediated  . Coronary atherosclerosis of native coronary artery     DES LAD 3/12, 99% nondominant RCA, No CAD otherwise, LVEF 65%  . Stroke, hemorrhagic     Right thalamic hemorrhage 4/12  . Diabetes mellitus   . Hyperlipidemia   . Stroke   . Diabetic retinopathy     mild    Social History Ms. Hanzlik reports that she quit smoking about 24 years ago. Her smoking use included Cigarettes. She smoked 0.25 packs per day. She has never used smokeless tobacco. Ms. Roszak reports that she does not drink alcohol.  Review of Systems No palpitations, dizziness, syncope. No  bleeding episodes. No orthopnea or PND. No leg edema.  Physical Examination Filed Vitals:   04/21/13 1422  BP: 158/76  Pulse: 61   Filed Weights   04/21/13 1422  Weight: 156 lb 0.6 oz (70.779 kg)    Normally nourished appearing elderly woman in no acute distress.  HEENT: Conjunctiva and lids are normal, oropharynx with moist mucosa.  Neck: Supple, no elevated JVP or bruits, or thyromegaly.  Lungs: Clear to auscultation, nonlabored.  Cardiac: Regular rate and rhythm, no S3 or pericardial rub.  Abdomen: Soft, nontender, bowel sounds present.  Skin: Warm and dry.  Musculoskeletal: No kyphosis.  Extremities: No pitting edema.    Problem List and Plan   Coronary atherosclerosis of native coronary artery Symptomatically stable on medical therapy. ECG is normal. Continue regular exercise and observation. Followup arranged in 6 months.  Mixed hyperlipidemia Recent lipids reviewed, LDL under 100.  Essential  hypertension, benign Blood pressure elevated today. Continue to follow up with Dr. Gerda Diss.    Jonelle Sidle, M.D., F.A.C.C.

## 2013-04-21 NOTE — Assessment & Plan Note (Signed)
Recent lipids reviewed, LDL under 100.

## 2013-04-21 NOTE — Assessment & Plan Note (Signed)
Symptomatically stable on medical therapy. ECG is normal. Continue regular exercise and observation. Followup arranged in 6 months.

## 2013-04-21 NOTE — Assessment & Plan Note (Signed)
Blood pressure elevated today. Continue to follow up with Dr. Gerda Diss.

## 2013-04-21 NOTE — Patient Instructions (Addendum)
Your physician recommends that you schedule a follow-up appointment in: 6 MONTHS WITH DR SM 

## 2013-05-19 ENCOUNTER — Encounter: Payer: Self-pay | Admitting: Neurology

## 2013-05-28 ENCOUNTER — Other Ambulatory Visit: Payer: Self-pay | Admitting: Cardiology

## 2013-06-14 ENCOUNTER — Ambulatory Visit (INDEPENDENT_AMBULATORY_CARE_PROVIDER_SITE_OTHER): Payer: Medicare Other | Admitting: Family Medicine

## 2013-06-14 ENCOUNTER — Encounter: Payer: Self-pay | Admitting: Family Medicine

## 2013-06-14 VITALS — BP 154/72 | Ht 62.0 in | Wt 158.2 lb

## 2013-06-14 DIAGNOSIS — Z23 Encounter for immunization: Secondary | ICD-10-CM

## 2013-06-14 DIAGNOSIS — E119 Type 2 diabetes mellitus without complications: Secondary | ICD-10-CM

## 2013-06-14 NOTE — Progress Notes (Signed)
  Subjective:    Patient ID: [name], female    DOB: 1934/03/17, 79 y.o.   MRN: [id number]  HPI Patient is here today to get the flu vaccine.   She also states that she gets her ears irrigated once a year, but they do not fee like they are clogged today. Last week they didn't feel that way.  Pt and son would like to see if she still needs to be on diabetic supplies b/c her blood sugars have been WNL with diet control. Generally when patient checks her sugars they are in good control 1 teens oral last. Trying to watch her diet. Not exercising much.  Compliant with lipid medication. Watching diet in that regard also.  Reports ongoing diminished energy. Of note Celexa was increased to 3 months ago. She tick a higher dose only for 1 week.    Review of Systems No chest pain no back pain no headache ROS otherwise negative    Objective:   Physical Exam  Alert no apparent distress. HEENT normal. Lungs clear. Heart regular in rhythm. Ankles without edema. Near impaction of years secondary to cerumen     Assessment & Plan:  Impression 1 type 2 diabetes tight control. #2 hypertension good control. #3 near impaction of ears secondary to cerumen. #4 probable element of depression. Plan flu shot today. Maintain same meds. Go back up to one and half daily on the Celexa. Diet exercise discussed. Recheck in several months with me. Back off to sugar checks to once per week. WSL

## 2013-06-22 ENCOUNTER — Ambulatory Visit (INDEPENDENT_AMBULATORY_CARE_PROVIDER_SITE_OTHER): Payer: Medicare Other

## 2013-06-22 DIAGNOSIS — H6120 Impacted cerumen, unspecified ear: Secondary | ICD-10-CM

## 2013-06-22 DIAGNOSIS — H6123 Impacted cerumen, bilateral: Secondary | ICD-10-CM

## 2013-06-22 NOTE — Progress Notes (Signed)
  Subjective:    Patient ID: [name], female    DOB: Jun 01, 1934, 79 y.o.   MRN: [id number]  HPI Bilateral ear irrigation completed without difficulty as per ordered.    Review of Systems     Objective:   Physical Exam        Assessment & Plan:

## 2013-07-01 ENCOUNTER — Encounter: Payer: Self-pay | Admitting: Neurology

## 2013-07-01 ENCOUNTER — Ambulatory Visit (INDEPENDENT_AMBULATORY_CARE_PROVIDER_SITE_OTHER): Payer: Medicare Other | Admitting: Neurology

## 2013-07-01 VITALS — BP 164/66 | HR 63 | Ht 63.0 in | Wt 159.0 lb

## 2013-07-01 DIAGNOSIS — I619 Nontraumatic intracerebral hemorrhage, unspecified: Secondary | ICD-10-CM

## 2013-07-01 NOTE — Patient Instructions (Signed)
Continue strict control of hypertension with blood pressure goal below 130/90. Continue fish oil for mild cognitive impairment and increased participation in cognitively challenging activities like playing bridge, crossword puzzles or sudoku. Check followup carotid ultrasound study. Return for followup in 6 months with Enid Skeens, Np or. call earlier if necessary.

## 2013-07-02 NOTE — Progress Notes (Signed)
[facility] [address] [place]. [place] [address] [phone]       OFFICE FOLLOW-UP NOTE  [name] Date of Birth:  12/06/1933 Medical Record Number:  [id number]   HPI: 60 year lady with right thalamic hemorrhage in April 2012 with mild memory loss likely from mild cognitive impairmen Update 07/01/13 She returns for f/u after last visit 12/30/11.She is doing well and has not any further stroke or TIA symptoms for over 4 years.She continues to have mild short term memory difficulties which are not specifically progressive and she remains independent in [employer] and manages her own affairs. She states her blood pressure is well controlled.She has not had any new health complaints.She states depression is well controlled. ROS:   14 system review of systems is positive for  Fatigue,aching muscles,joint pain,memory loss,confusion,depression,anxiety,insomnia and decreased energy  PMH:  Past Medical History  Diagnosis Date  . Essential hypertension, benign   . Type 2 diabetes mellitus   . GERD (gastroesophageal reflux disease)   . Cataracts, bilateral   . Diverticulitis   . Allergic rhinitis   . Syncope     Neurally mediated  . Coronary atherosclerosis of native coronary artery     DES LAD 3/12, 99% nondominant RCA, No CAD otherwise, LVEF 65%  . Stroke, hemorrhagic     Right thalamic hemorrhage 4/12  . Diabetes mellitus   . Hyperlipidemia   . Stroke   . Diabetic retinopathy     mild    Social History:  History   Social History  . Marital Status: Married    Spouse Name: N/A    Number of Children: 2  . Years of Education: N/A   Occupational History  . Retired    Social History Main Topics  . Smoking status: Former Smoker -- 0.25 packs/day    Types: Cigarettes    Quit date: 09/02/1988  . Smokeless tobacco: Never Used  . Alcohol Use: No  . Drug Use: No  . Sexual Activity: No   Other Topics Concern  . Not on file   Social History  Narrative  . No narrative on file    Medications:   Current Outpatient Prescriptions on File Prior to Visit  Medication Sig Dispense Refill  . ALPRAZolam (XANAX) 0.25 MG tablet TAKE ONE TABLET DAILY AS NEEDED.  30 tablet  3  . amLODipine (NORVASC) 2.5 MG tablet TAKE ONE TABLET DAILY.  30 tablet  6  . aspirin 81 MG tablet Take 81 mg by mouth daily.        . calcium citrate-vitamin D (CITRACAL+D) 315-200 MG-UNIT per tablet Take 1 tablet by mouth every other day.       Marland Kitchen CINNAMON PO Take by mouth daily.      . citalopram (CELEXA) 20 MG tablet Take 1 1/2 tabs po qd  45 tablet  2  . COCONUT OIL PO Take by mouth 2 (two) times daily.      . Coenzyme Q10 150 MG CAPS Take 150 mg by mouth every other day.       . fluvastatin (LESCOL) 40 MG capsule TAKE (1) CAPSULE BY MOUTH EVERY DAY FOR INCREASED CHOLESTEROL.  30 capsule  5  . metoprolol succinate (TOPROL-XL) 25 MG 24 hr tablet TAKE (1) TABLET BY MOUTH AT BEDTIME.  30 tablet  6  . mometasone (NASONEX) 50 MCG/ACT nasal spray Place 2 sprays into the nose as needed.        . Multiple Vitamins-Minerals (CENTRUM) tablet Take  1 tablet by mouth daily.       . nitroGLYCERIN (NITROSTAT) 0.4 MG SL tablet Place 0.4 mg under the tongue every 5 (five) minutes as needed.        . Omega-3 Fatty Acids (FISH OIL) 1000 MG CAPS Take 1,000 mg by mouth daily.       . Psyllium (METAMUCIL PO) Take by mouth daily.      . ramipril (ALTACE) 5 MG capsule TAKE 1 CAPSULE BY MOUTH TWICE DAILY.  60 capsule  5  . TURMERIC PO Take by mouth 2 (two) times daily.       No current facility-administered medications on file prior to visit.    Allergies:   Allergies  Allergen Reactions  . Macrodantin   . Pravachol [Pravastatin Sodium]   . Tetanus Toxoids    Filed Vitals:   07/01/13 1437  BP: 164/66  Pulse: 63    Physical Exam General: well developed, well nourished, seated, in no evident distress Head: head normocephalic and atraumatic. Orohparynx benign Neck: supple with  no carotid or supraclavicular bruits Cardiovascular: regular rate and rhythm, no murmurs Musculoskeletal: no deformity Skin:  no rash/petichiae Vascular:  Normal pulses all extremities  Neurologic Exam Mental Status: Awake and fully alert. Oriented to place and time. Recent and remote memory intact. Attention span, concentration and fund of knowledge appropriate. Mood and affect appropriate. Diminished recall 1/3.Animal Naming Test 10 only. Cranial Nerves: Fundoscopic exam reveals sharp disc margins. Pupils equal, briskly reactive to light. Extraocular movements full without nystagmus. Visual fields full to confrontation. Hearing intact. Facial sensation intact. Face, tongue, palate moves normally and symmetrically.  Motor: Normal bulk and tone. Normal strength in all tested extremity muscles.diminished fine finger movements on left and orbits right over left upper extremity Sensory.: intact to touch and pinprick and vibratory sensation.  Coordination: Rapid alternating movements normal in all extremities. Finger-to-nose and heel-to-shin performed accurately bilaterally. Gait and Station: Arises from chair without difficulty. Stance is normal. Gait demonstrates normal stride length and balance . Able to heel, toe and tandem walk without difficulty.  Reflexes: 1+ and symmetric. Toes downgoing.     ASSESSMENT: 89 year lady with right thalamic hemorrhage in April 2012 with mild memory loss likely from mild cognitive impairment  PLAN: Continue strict control of hypertension with blood pressure goal below 130/90. Continue fish oil for mild cognitive impairment and increased participation in cognitively challenging activities like playing bridge, crossword puzzles or sudoku. Check followup carotid ultrasound study. Return for followup in 6 months with Enid Skeens, NP or. call earlier if necessary.

## 2013-07-15 ENCOUNTER — Other Ambulatory Visit: Payer: Self-pay | Admitting: Nurse Practitioner

## 2013-07-16 ENCOUNTER — Ambulatory Visit (INDEPENDENT_AMBULATORY_CARE_PROVIDER_SITE_OTHER): Payer: Medicare Other

## 2013-07-16 DIAGNOSIS — I6350 Cerebral infarction due to unspecified occlusion or stenosis of unspecified cerebral artery: Secondary | ICD-10-CM

## 2013-07-16 DIAGNOSIS — I619 Nontraumatic intracerebral hemorrhage, unspecified: Secondary | ICD-10-CM

## 2013-07-28 ENCOUNTER — Other Ambulatory Visit: Payer: Self-pay | Admitting: Family Medicine

## 2013-08-23 ENCOUNTER — Other Ambulatory Visit: Payer: Self-pay

## 2013-08-23 MED ORDER — METOPROLOL SUCCINATE ER 25 MG PO TB24: ORAL_TABLET | ORAL | Status: DC

## 2013-09-16 ENCOUNTER — Ambulatory Visit: Payer: Medicare Other | Admitting: Family Medicine

## 2013-09-23 ENCOUNTER — Encounter: Payer: Self-pay | Admitting: Family Medicine

## 2013-09-23 ENCOUNTER — Ambulatory Visit (INDEPENDENT_AMBULATORY_CARE_PROVIDER_SITE_OTHER): Payer: Medicare Other | Admitting: Family Medicine

## 2013-09-23 VITALS — BP 108/64 | Ht 62.0 in | Wt 155.0 lb

## 2013-09-23 DIAGNOSIS — E119 Type 2 diabetes mellitus without complications: Secondary | ICD-10-CM

## 2013-09-23 DIAGNOSIS — E782 Mixed hyperlipidemia: Secondary | ICD-10-CM

## 2013-09-23 DIAGNOSIS — I10 Essential (primary) hypertension: Secondary | ICD-10-CM

## 2013-09-23 LAB — POCT GLYCOSYLATED HEMOGLOBIN (HGB A1C): Hemoglobin A1C: 5.7

## 2013-09-23 MED ORDER — CITALOPRAM HYDROBROMIDE 20 MG PO TABS: 20.0000 mg | ORAL_TABLET | Freq: Every day | ORAL | Status: DC

## 2013-09-23 MED ORDER — NITROGLYCERIN 0.4 MG SL SUBL: SUBLINGUAL_TABLET | SUBLINGUAL | Status: DC

## 2013-09-23 NOTE — Progress Notes (Signed)
   Subjective:    Patient ID: [name], female    DOB: Jan 30, 1934, 80 y.o.   MRN: [id number]  HPIMed Check up. A1C 5.7. Results for orders placed in visit on 09/23/13  POCT GLYCOSYLATED HEMOGLOBIN (HGB A1C)      Result Value Range   Hemoglobin A1C 5.7      Pt checks blood sugar once a week.   Not exercising much these days. Trying to stay active at [facility]  Compliant with the bp meds. Trying to wathsalt intake.    And in generally numbers in good control. Trying to watch sugar intake.  No new symptoms of TIA. No stroke symptoms. No new neurological symptoms.  No chest pain. No nausea or diaphoresis with exertion. Still sees a cardiologist at regular intervals.  Needs refill on nitroglycerin. Her RX has expired.   Having fatigue since stroke.     Review of Systems    no abdominal pain no change in bowel habits no chest pain no blood in stool no rash ROS otherwise negative Objective:   Physical Exam  Alert blood pressure 122 or 74. No acute distress. HEENT normal. Lungs clear. Heart regular in rhythm. Neurological exam stable.      Assessment & Plan:  Impression 1 hypertension good control. #2 type 2 diabetes clinically stable. #3 reflux stable. #4 hyperlipidemia. #5 coronary artery disease. #6 status post stroke. Plan maintain same meds. Diet exercise discussed. Recheck in several months. WSL

## 2013-09-24 LAB — HEPATIC FUNCTION PANEL
ALT: 16 U/L (ref 0–35)
AST: 17 U/L (ref 0–37)
Albumin: 4.2 g/dL (ref 3.5–5.2)
Alkaline Phosphatase: 65 U/L (ref 39–117)
BILIRUBIN INDIRECT: 0.5 mg/dL (ref 0.0–0.9)
Bilirubin, Direct: 0.1 mg/dL (ref 0.0–0.3)
Total Bilirubin: 0.6 mg/dL (ref 0.3–1.2)
Total Protein: 6.5 g/dL (ref 6.0–8.3)

## 2013-09-24 LAB — LIPID PANEL
CHOLESTEROL: 153 mg/dL (ref 0–200)
HDL: 55 mg/dL (ref >39)
LDL Cholesterol: 67 mg/dL (ref 0–99)
TRIGLYCERIDES: 156 mg/dL — AB (ref <150)
Total CHOL/HDL Ratio: 2.8 Ratio
VLDL: 31 mg/dL (ref 0–40)

## 2013-09-25 LAB — MICROALBUMIN, URINE: Microalb, Ur: 0.92 mg/dL (ref 0.00–1.89)

## 2013-09-29 ENCOUNTER — Encounter: Payer: Self-pay | Admitting: Family Medicine

## 2013-10-25 ENCOUNTER — Encounter: Payer: Self-pay | Admitting: Cardiology

## 2013-10-25 ENCOUNTER — Ambulatory Visit (INDEPENDENT_AMBULATORY_CARE_PROVIDER_SITE_OTHER): Payer: Medicare Other | Admitting: Cardiology

## 2013-10-25 ENCOUNTER — Other Ambulatory Visit: Payer: Self-pay | Admitting: Family Medicine

## 2013-10-25 VITALS — BP 149/50 | HR 61 | Ht 63.0 in | Wt 156.0 lb

## 2013-10-25 DIAGNOSIS — I10 Essential (primary) hypertension: Secondary | ICD-10-CM

## 2013-10-25 DIAGNOSIS — I2510 Atherosclerotic heart disease of native coronary artery without angina pectoris: Secondary | ICD-10-CM

## 2013-10-25 DIAGNOSIS — E782 Mixed hyperlipidemia: Secondary | ICD-10-CM

## 2013-10-25 NOTE — Assessment & Plan Note (Signed)
No active angina symptoms. Continue medical therapy and observation. Six-month followup arranged.

## 2013-10-25 NOTE — Telephone Encounter (Signed)
Ok plus one ref 

## 2013-10-25 NOTE — Assessment & Plan Note (Signed)
Lipids recently obtained, LDL at goal at 67. Continue Lescol.

## 2013-10-25 NOTE — Progress Notes (Signed)
Clinical Summary [name] is a 80 y.o.female last seen in August 2014. She is here with her son. Continues to do very well, no angina, no progressive shortness of breath, no cardiac hospitalizations.  Lab work from January showed cholesterol 153, triglycerides 156, HDL 55, LDL 67, normal LFTs. We discussed this today.  She continues to walk outdoors for exercise when the weather is reasonable. We discussed some options for aerobic activity indoors when the weather is cold.  Medications are outlined below. No major changes noted.   Allergies  Allergen Reactions  . Macrodantin   . Pravachol [Pravastatin Sodium]   . Tetanus Toxoids     Current Outpatient Prescriptions  Medication Sig Dispense Refill  . ALPRAZolam (XANAX) 0.25 MG tablet TAKE ONE TABLET DAILY AS NEEDED.  30 tablet  3  . amLODipine (NORVASC) 2.5 MG tablet TAKE ONE TABLET DAILY.  30 tablet  6  . aspirin 81 MG tablet Take 81 mg by mouth daily.        . calcium citrate-vitamin D (CITRACAL+D) 315-200 MG-UNIT per tablet Take 1 tablet by mouth every other day.       Marland Kitchen CINNAMON PO Take by mouth daily.      . citalopram (CELEXA) 20 MG tablet Take 1 tablet (20 mg total) by mouth daily.  30 tablet  5  . COCONUT OIL PO Take by mouth 2 (two) times daily.      . Coenzyme Q10 150 MG CAPS Take 150 mg by mouth every other day.       . fluvastatin (LESCOL) 40 MG capsule TAKE (1) CAPSULE BY MOUTH EVERY DAY FOR INCREASED CHOLESTEROL.  30 capsule  5  . metoprolol succinate (TOPROL-XL) 25 MG 24 hr tablet TAKE (1) TABLET BY MOUTH AT BEDTIME.  30 tablet  6  . Multiple Vitamins-Minerals (CENTRUM) tablet Take 1 tablet by mouth daily.       . nitroGLYCERIN (NITROSTAT) 0.4 MG SL tablet Dissolve 1 tablet under tongue every 5 mins up to 15 mins for chest pain. If no relief call 911.  25 tablet  0  . Omega-3 Fatty Acids (FISH OIL) 1000 MG CAPS Take 1,000 mg by mouth daily.       . Psyllium (METAMUCIL PO) Take by mouth daily.      . ramipril  (ALTACE) 5 MG capsule TAKE 1 CAPSULE BY MOUTH TWICE DAILY.  60 capsule  5  . TURMERIC PO Take by mouth 2 (two) times daily.       No current facility-administered medications for this visit.    Past Medical History  Diagnosis Date  . Essential hypertension, benign   . Type 2 diabetes mellitus   . GERD (gastroesophageal reflux disease)   . Cataracts, bilateral   . Diverticulitis   . Allergic rhinitis   . Syncope     Neurally mediated  . Coronary atherosclerosis of native coronary artery     DES LAD 3/12, 99% nondominant RCA, No CAD otherwise, LVEF 65%  . Stroke, hemorrhagic     Right thalamic hemorrhage 4/12  . Diabetes mellitus   . Hyperlipidemia   . Stroke   . Diabetic retinopathy     mild    Social History Ms. Kushner reports that she quit smoking about 25 years ago. Her smoking use included Cigarettes. She smoked 0.25 packs per day. She has never used smokeless tobacco. Ms. Grilliot reports that she does not drink alcohol.  Review of Systems Head no palpitations, no dizziness or  syncope. No claudication. No orthopnea or PND. No reported bleeding episodes. Otherwise negative.  Physical Examination Filed Vitals:   10/25/13 1507  BP: 149/50  Pulse: 61   Filed Weights   10/25/13 1507  Weight: 156 lb (70.761 kg)    Normally nourished appearing elderly woman in no acute distress.  HEENT: Conjunctiva and lids are normal, oropharynx with moist mucosa.  Neck: Supple, no elevated JVP or bruits, or thyromegaly.  Lungs: Clear to auscultation, nonlabored.  Cardiac: Regular rate and rhythm, no S3 or pericardial rub.  Abdomen: Soft, nontender, bowel sounds present.  Skin: Warm and dry.  Musculoskeletal: No kyphosis.  Extremities: No pitting edema.    Problem List and Plan   Coronary atherosclerosis of native coronary artery No active angina symptoms. Continue medical therapy and observation. Six-month followup arranged.  Mixed hyperlipidemia Lipids recently  obtained, LDL at goal at 59. Continue Lescol.  Essential hypertension, benign Keep followup with Dr. Wolfgang Phoenix.    Satira Sark, M.D., F.A.C.C.

## 2013-10-25 NOTE — Assessment & Plan Note (Signed)
Keep followup with Dr. Luking. 

## 2013-10-25 NOTE — Patient Instructions (Signed)
Your physician wants you to follow-up in: 6 months You will receive a reminder letter in the mail two months in advance. If you don't receive a letter, please call our office to schedule the follow-up appointment.     Your physician recommends that you continue on your current medications as directed. Please refer to the Current Medication list given to you today.      Thank you for choosing [facility] !        

## 2013-11-08 ENCOUNTER — Telehealth: Payer: Self-pay | Admitting: Family Medicine

## 2013-11-08 ENCOUNTER — Other Ambulatory Visit: Payer: Self-pay | Admitting: Family Medicine

## 2013-11-08 NOTE — Telephone Encounter (Signed)
Script faxed to pharmacy. Patient was notified.  

## 2013-11-08 NOTE — Telephone Encounter (Signed)
Pt needs refill on her test strips for her [employer]  Monitor system   [place] [facility]

## 2013-12-21 ENCOUNTER — Other Ambulatory Visit: Payer: Self-pay | Admitting: Cardiology

## 2013-12-30 ENCOUNTER — Encounter (INDEPENDENT_AMBULATORY_CARE_PROVIDER_SITE_OTHER): Payer: Self-pay

## 2013-12-30 ENCOUNTER — Encounter: Payer: Self-pay | Admitting: Nurse Practitioner

## 2013-12-30 ENCOUNTER — Ambulatory Visit (INDEPENDENT_AMBULATORY_CARE_PROVIDER_SITE_OTHER): Payer: Medicare Other | Admitting: Nurse Practitioner

## 2013-12-30 VITALS — BP 153/64 | HR 56 | Ht 63.0 in | Wt 158.0 lb

## 2013-12-30 DIAGNOSIS — I619 Nontraumatic intracerebral hemorrhage, unspecified: Secondary | ICD-10-CM

## 2013-12-30 NOTE — Progress Notes (Signed)
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 She states her blood pressure is well controlled.She has not had any new health complaints.She states depression is well controlled.   12/30/13 (LL): Patient returns for stroke follow up.  She has been doing well.  Carotid dopplers completed in November 2014 was consistent with stenosis of the right PCA also with presence of acoustic shadow plaque. Acoustic shadow plaque was observed in the right bulb. She states her blood pressure is well controlled at home although is elevated today.  BP in office today is 153/64. She does not feel like her memory is any worse than last visit. She is not had any further stroke or TIA symptoms. She is tolerating daily aspirin well without serious bruising or bleeding.  REVIEW OF SYSTEMS: Full 14 system review of systems performed and notable only for:  Anxiety  ALLERGIES: Allergies  Allergen Reactions  . Macrodantin   . Pravachol [Pravastatin Sodium]   . Tetanus Toxoids     HOME MEDICATIONS: Outpatient Prescriptions Prior to Visit  Medication Sig Dispense Refill  . ALPRAZolam (XANAX) 0.25 MG tablet TAKE ONE TABLET DAILY AS NEEDED.  30 tablet  1  . amLODipine (NORVASC) 2.5 MG tablet TAKE ONE TABLET DAILY.  90 tablet  3  . aspirin 81 MG tablet Take 81 mg by mouth daily.        . calcium citrate-vitamin D (CITRACAL+D) 315-200 MG-UNIT per tablet Take 1 tablet by mouth every other day.       Marland Kitchen  CINNAMON PO Take by mouth daily.      . citalopram (CELEXA) 20 MG tablet TAKE 1 AND 1/2 TABLETS BY MOUTH ONCE DAILY.  45 tablet  3  . COCONUT OIL PO Take by mouth 2 (two) times daily.      . Coenzyme Q10 150 MG CAPS Take 150 mg by mouth every other day.       . fluvastatin (LESCOL) 40 MG capsule TAKE (1) CAPSULE BY MOUTH EVERY DAY FOR INCREASED CHOLESTEROL.  30 capsule  5  . metoprolol succinate (TOPROL-XL) 25 MG 24 hr tablet TAKE (1) TABLET BY MOUTH AT BEDTIME.  30 tablet  6  . Multiple Vitamins-Minerals (CENTRUM) tablet Take 1 tablet by mouth daily.       . nitroGLYCERIN (NITROSTAT) 0.4 MG SL tablet Dissolve 1 tablet under tongue every 5 mins up to 15 mins for chest pain. If no relief call 911.  25 tablet  0  . Omega-3 Fatty Acids (FISH OIL) 1000 MG CAPS Take 1,000 mg by mouth daily.       . Psyllium (METAMUCIL PO) Take by mouth daily.      . ramipril (ALTACE) 5 MG capsule TAKE 1 CAPSULE BY MOUTH TWICE DAILY.  60 capsule  5  . TURMERIC PO Take by mouth 2 (two) times daily.       No facility-administered medications prior to visit.     PHYSICAL EXAM  Filed Vitals:   12/30/13 1522  BP: 153/64  Pulse: 56  Height: 5\' 3"  (1.6 m)  Weight: 158 lb (71.668 kg)   Body mass index is 28 kg/(m^2).  General: well developed, well nourished, seated, in no evident distress  Head: head normocephalic and atraumatic. Orohparynx benign  Neck: supple with no carotid or supraclavicular bruits  Cardiovascular: regular rate and rhythm, no murmurs  Musculoskeletal: no deformity  Skin: no rash/petichiae  Vascular: Normal pulses all extremities   Neurologic Exam  Mental Status: Awake and fully alert. Oriented to place and time. Recent and remote memory intact. Attention span, concentration and fund of knowledge appropriate. Mood and affect appropriate.  Cranial Nerves:  Pupils equal, briskly reactive to light. Extraocular movements full without nystagmus. Visual fields full to confrontation. Hearing  intact. Facial sensation intact. Face, tongue, palate moves normally and symmetrically.  Motor: Normal bulk and tone. Normal strength in all tested extremity muscles.diminished fine finger movements on left and orbits right over left upper extremity  Sensory: intact to light touch  Coordination: Rapid alternating movements normal in all extremities. Finger-to-nose and heel-to-shin performed accurately bilaterally.  Gait and Station: Arises from chair without difficulty. Stance is normal. Gait demonstrates normal stride length and balance . Able to heel, toe and tandem walk without difficulty.  Reflexes: 1+ and symmetric.   ASSESSMENT AND PLAN 73 year lady with right thalamic hemorrhage in April 2012 with mild memory loss likely from mild cognitive impairment.  PLAN:  Continue strict control of hypertension with blood pressure goal below 130/90. Continue fish oil for mild cognitive impairment and increased participation in cognitively challenging activities like playing bridge, crossword puzzles or sudoku. Return for followup in 12 months or call earlier if necessary.   Philmore Pali, MSN, NP-C 12/30/2013, 3:33 PM [facility] [address], [place], [place] [address] [phone]  Note: This document was prepared with digital dictation and possible smart phrase technology. Any transcriptional errors that result from this process are unintentional.

## 2013-12-30 NOTE — Patient Instructions (Signed)
Continue strict control of hypertension with blood pressure goal below 130/90. Continue fish oil for mild cognitive impairment and increased participation in cognitively challenging activities like playing bridge, crossword puzzles or sudoku. Return for followup in 6 months or call earlier if necessary.

## 2014-01-25 ENCOUNTER — Other Ambulatory Visit: Payer: Self-pay | Admitting: Family Medicine

## 2014-02-23 ENCOUNTER — Other Ambulatory Visit: Payer: Self-pay | Admitting: Family Medicine

## 2014-02-24 ENCOUNTER — Telehealth: Payer: Self-pay | Admitting: Family Medicine

## 2014-02-24 MED ORDER — FLUVASTATIN SODIUM 40 MG PO CAPS: ORAL_CAPSULE | ORAL | Status: DC

## 2014-02-24 NOTE — Telephone Encounter (Signed)
Notified patient via VM stating we sent in the med. 

## 2014-02-24 NOTE — Telephone Encounter (Signed)
Patient needs Rx for fluvastatin (LESCOL) 40 MG capsule to [facility]. She has a followup appointment for March 02, 2014.

## 2014-03-02 ENCOUNTER — Ambulatory Visit (INDEPENDENT_AMBULATORY_CARE_PROVIDER_SITE_OTHER): Payer: Medicare Other | Admitting: Family Medicine

## 2014-03-02 ENCOUNTER — Encounter: Payer: Self-pay | Admitting: Family Medicine

## 2014-03-02 VITALS — BP 130/78 | Ht 62.0 in | Wt 158.4 lb

## 2014-03-02 DIAGNOSIS — Z79899 Other long term (current) drug therapy: Secondary | ICD-10-CM

## 2014-03-02 DIAGNOSIS — E782 Mixed hyperlipidemia: Secondary | ICD-10-CM

## 2014-03-02 DIAGNOSIS — Z23 Encounter for immunization: Secondary | ICD-10-CM

## 2014-03-02 DIAGNOSIS — E119 Type 2 diabetes mellitus without complications: Secondary | ICD-10-CM

## 2014-03-02 LAB — POCT GLYCOSYLATED HEMOGLOBIN (HGB A1C): Hemoglobin A1C: 5.3

## 2014-03-02 NOTE — Progress Notes (Signed)
   Subjective:    Patient ID: [name], female    DOB: May 14, 1934, 80 y.o.   MRN: [id number]  Hypertension This is a chronic problem. The current episode started more than 1 year ago. Risk factors for coronary artery disease include diabetes mellitus, dyslipidemia and post-menopausal state. Treatments tried: altace, norvasc, metoprolol. There are no compliance problems.     Results for orders placed in visit on 03/02/14  POCT GLYCOSYLATED HEMOGLOBIN (HGB A1C)      Result Value Ref Range   Hemoglobin A1C 5.3     Due to see card soon  Walking some the last few days  Diet middle of the road  Pt sees an eye doc regularly due in dec  Patient trying hard to watch her sugars. Generally in the low 100s when checked. No low sugar spells.  Compliant with blood pressure medicine. No obvious side effects from it. Watching salt intake.  No new symptoms of either stroke or heart attack.  Wonders how her ears doing. Not hearing as well. Has had to get convinced out in the past.  Review of Systems No chest pain no headache no back pain no abdominal pain no change about habits no blood in stool    Objective:   Physical Exam  Alert no apparent distress. HEENT normal. Lungs clear. Heart regular in rhythm. Blood pressure good on repeat. Ankles without edema.      Assessment & Plan:  Impression 1 hypertension good control. #2 type 2 diabetes good control. #3 hyperlipidemia status uncertain-discuss. #4 status post stroke stable. #5 coronary artery disease clinically stable. Plan Prevnar injection. Appropriate blood work. Diet exercise discussed. Maintain same medications. Recheck in 6 months. WSL

## 2014-03-04 LAB — HEPATIC FUNCTION PANEL
ALBUMIN: 4.3 g/dL (ref 3.5–5.2)
ALT: 20 U/L (ref 0–35)
AST: 19 U/L (ref 0–37)
Alkaline Phosphatase: 79 U/L (ref 39–117)
BILIRUBIN INDIRECT: 0.6 mg/dL (ref 0.2–1.2)
BILIRUBIN TOTAL: 0.7 mg/dL (ref 0.2–1.2)
Bilirubin, Direct: 0.1 mg/dL (ref 0.0–0.3)
Total Protein: 6.5 g/dL (ref 6.0–8.3)

## 2014-03-04 LAB — BASIC METABOLIC PANEL
BUN: 13 mg/dL (ref 6–23)
CO2: 29 meq/L (ref 19–32)
CREATININE: 0.98 mg/dL (ref 0.50–1.10)
Calcium: 9.4 mg/dL (ref 8.4–10.5)
Chloride: 103 mEq/L (ref 96–112)
Glucose, Bld: 106 mg/dL — ABNORMAL HIGH (ref 70–99)
Potassium: 4.7 mEq/L (ref 3.5–5.3)
Sodium: 139 mEq/L (ref 135–145)

## 2014-03-04 LAB — LIPID PANEL
CHOLESTEROL: 171 mg/dL (ref 0–200)
HDL: 54 mg/dL (ref >39)
LDL Cholesterol: 81 mg/dL (ref 0–99)
TRIGLYCERIDES: 180 mg/dL — AB (ref <150)
Total CHOL/HDL Ratio: 3.2 Ratio
VLDL: 36 mg/dL (ref 0–40)

## 2014-03-08 ENCOUNTER — Telehealth: Payer: Self-pay | Admitting: Family Medicine

## 2014-03-08 ENCOUNTER — Ambulatory Visit: Payer: Medicare Other | Admitting: *Deleted

## 2014-03-08 DIAGNOSIS — H6123 Impacted cerumen, bilateral: Secondary | ICD-10-CM

## 2014-03-08 NOTE — Telephone Encounter (Signed)
Patient would like her most recent blood work results resulted and mailed to her.

## 2014-03-08 NOTE — Telephone Encounter (Signed)
Results discussed with patient. Patient verbalized understanding.  Patient would like a copy of her labs mailed to her.

## 2014-03-08 NOTE — Telephone Encounter (Signed)
Advise pt liv and kid func fine, ldl has risen slightly not enough to change meds, glu only slightly elev, send copy of all

## 2014-03-23 ENCOUNTER — Other Ambulatory Visit: Payer: Self-pay | Admitting: Family Medicine

## 2014-03-24 ENCOUNTER — Telehealth: Payer: Self-pay | Admitting: *Deleted

## 2014-03-24 MED ORDER — METOPROLOL SUCCINATE ER 25 MG PO TB24: ORAL_TABLET | ORAL | Status: DC

## 2014-03-24 NOTE — Telephone Encounter (Signed)
[facility] METOPROLOL SUCC ER 25 MG #30 1 TAB AT BED TIME

## 2014-04-27 ENCOUNTER — Encounter: Payer: Self-pay | Admitting: Cardiology

## 2014-04-27 ENCOUNTER — Ambulatory Visit (INDEPENDENT_AMBULATORY_CARE_PROVIDER_SITE_OTHER): Payer: Medicare Other | Admitting: Cardiology

## 2014-04-27 VITALS — BP 144/70 | HR 60 | Ht 62.0 in | Wt 159.0 lb

## 2014-04-27 DIAGNOSIS — I10 Essential (primary) hypertension: Secondary | ICD-10-CM

## 2014-04-27 DIAGNOSIS — I2510 Atherosclerotic heart disease of native coronary artery without angina pectoris: Secondary | ICD-10-CM

## 2014-04-27 DIAGNOSIS — E782 Mixed hyperlipidemia: Secondary | ICD-10-CM

## 2014-04-27 NOTE — Progress Notes (Signed)
Clinical Summary [name] is a 80 y.o.female last seen in February. She reports that she has been doing very well, no angina symptoms or unusual shortness of breath. Remains functional in her ADLs including house chores. Has not been walking as much for exercise during the hot summer months. She reports compliance with her medications. Continues to followup with Dr. Wolfgang Phoenix.  Lab work in July should cholesterol 171, triglycerides 180, HDL 54, and LDL 81. LFTs were normal.  ECG today shows normal sinus rhythm.  Allergies  Allergen Reactions  . Macrodantin   . Pravachol [Pravastatin Sodium]   . Tetanus Toxoids     Current Outpatient Prescriptions  Medication Sig Dispense Refill  . ALPRAZolam (XANAX) 0.25 MG tablet TAKE ONE TABLET DAILY AS NEEDED.  30 tablet  1  . amLODipine (NORVASC) 2.5 MG tablet TAKE ONE TABLET DAILY.  90 tablet  3  . aspirin 81 MG tablet Take 81 mg by mouth daily.        . calcium citrate-vitamin D (CITRACAL+D) 315-200 MG-UNIT per tablet Take 1 tablet by mouth every other day.       Marland Kitchen CINNAMON PO Take by mouth daily.      . citalopram (CELEXA) 20 MG tablet TAKE 1 AND 1/2 TABLETS BY MOUTH ONCE DAILY.  45 tablet  3  . COCONUT OIL PO Take by mouth 2 (two) times daily.      . Coenzyme Q10 150 MG CAPS Take 150 mg by mouth every other day.       . fluvastatin (LESCOL) 40 MG capsule TAKE (1) CAPSULE BY MOUTH EVERY DAY FOR INCREASED CHOLESTEROL.  30 capsule  5  . metoprolol succinate (TOPROL-XL) 25 MG 24 hr tablet TAKE (1) TABLET BY MOUTH AT BEDTIME.  30 tablet  6  . Multiple Vitamins-Minerals (CENTRUM) tablet Take 1 tablet by mouth daily.       . nitroGLYCERIN (NITROSTAT) 0.4 MG SL tablet Dissolve 1 tablet under tongue every 5 mins up to 15 mins for chest pain. If no relief call 911.  25 tablet  0  . Omega-3 Fatty Acids (FISH OIL) 1000 MG CAPS Take 1,000 mg by mouth daily.       . Psyllium (METAMUCIL PO) Take by mouth daily.      . ramipril (ALTACE) 5 MG capsule  TAKE 1 CAPSULE BY MOUTH TWICE DAILY.  60 capsule  5  . TURMERIC PO Take by mouth 2 (two) times daily.       No current facility-administered medications for this visit.    Past Medical History  Diagnosis Date  . Essential hypertension, benign   . Type 2 diabetes mellitus   . GERD (gastroesophageal reflux disease)   . Cataracts, bilateral   . Diverticulitis   . Allergic rhinitis   . Syncope     Neurally mediated  . Coronary atherosclerosis of native coronary artery     DES LAD 3/12, 99% nondominant RCA, No CAD otherwise, LVEF 65%  . Stroke, hemorrhagic     Right thalamic hemorrhage 4/12  . Diabetes mellitus   . Hyperlipidemia   . Stroke   . Diabetic retinopathy     mild    Social History [name] reports that she quit smoking about 25 years ago. Her smoking use included Cigarettes. She smoked 0.25 packs per day. She has never used smokeless tobacco. [name] reports that she does not drink alcohol.  Review of Systems Head no palpitations or syncope. No bleeding problems. No  hospitalizations since last visit. Other systems reviewed and negative.  Physical Examination Filed Vitals:   04/27/14 1431  BP: 144/70  Pulse: 60   Filed Weights   04/27/14 1431  Weight: 159 lb (72.122 kg)    Normally nourished appearing elderly woman in no acute distress.  HEENT: Conjunctiva and lids are normal, oropharynx with moist mucosa.  Neck: Supple, no elevated JVP or bruits, or thyromegaly.  Lungs: Clear to auscultation, nonlabored.  Cardiac: Regular rate and rhythm, no S3 or pericardial rub.  Abdomen: Soft, nontender, bowel sounds present.  Skin: Warm and dry.  Musculoskeletal: No kyphosis.  Extremities: No pitting edema.    Problem List and Plan   Coronary atherosclerosis of native coronary artery Symptomatically stable on present medical regimen. ECG is normal. We continue observation. Followup in 6 months.  Mixed hyperlipidemia Recent LDL 81.  Essential  hypertension, benign No change to current regimen. Keep followup with Dr. Wolfgang Phoenix.    Satira Sark, M.D., F.A.C.C.

## 2014-04-27 NOTE — Assessment & Plan Note (Signed)
Recent LDL 81.

## 2014-04-27 NOTE — Assessment & Plan Note (Signed)
Symptomatically stable on present medical regimen. ECG is normal. We continue observation. Followup in 6 months.

## 2014-04-27 NOTE — Assessment & Plan Note (Signed)
No change to current regimen. Keep followup with Dr. Wolfgang Phoenix.

## 2014-04-27 NOTE — Patient Instructions (Signed)
Your physician wants you to follow-up in: 6 months with Dr. McDowell You will receive a reminder letter in the mail two months in advance. If you don't receive a letter, please call our office to schedule the follow-up appointment.  Your physician recommends that you continue on your current medications as directed. Please refer to the Current Medication list given to you today.  Thank you for choosing [facility]!!    

## 2014-05-10 ENCOUNTER — Other Ambulatory Visit: Payer: Self-pay | Admitting: Family Medicine

## 2014-07-20 ENCOUNTER — Other Ambulatory Visit: Payer: Self-pay | Admitting: Family Medicine

## 2014-07-20 ENCOUNTER — Encounter: Payer: Self-pay | Admitting: Neurology

## 2014-07-20 NOTE — Telephone Encounter (Signed)
Ok plus 5 ref 

## 2014-09-09 DIAGNOSIS — H5203 Hypermetropia, bilateral: Secondary | ICD-10-CM | POA: Diagnosis not present

## 2014-09-09 DIAGNOSIS — H52223 Regular astigmatism, bilateral: Secondary | ICD-10-CM | POA: Diagnosis not present

## 2014-09-09 DIAGNOSIS — H524 Presbyopia: Secondary | ICD-10-CM | POA: Diagnosis not present

## 2014-09-09 DIAGNOSIS — E11319 Type 2 diabetes mellitus with unspecified diabetic retinopathy without macular edema: Secondary | ICD-10-CM | POA: Diagnosis not present

## 2014-09-09 LAB — HM DIABETES EYE EXAM

## 2014-09-19 ENCOUNTER — Encounter: Payer: Self-pay | Admitting: *Deleted

## 2014-09-19 ENCOUNTER — Other Ambulatory Visit: Payer: Self-pay | Admitting: Family Medicine

## 2014-09-30 ENCOUNTER — Encounter (HOSPITAL_COMMUNITY): Payer: Self-pay

## 2014-09-30 ENCOUNTER — Observation Stay (HOSPITAL_COMMUNITY)
Admission: EM | Admit: 2014-09-30 | Discharge: 2014-10-04 | Disposition: A | Discharge status: Home / Self Care | Payer: Medicare PPO | Attending: Internal Medicine | Admitting: Internal Medicine

## 2014-09-30 ENCOUNTER — Telehealth: Payer: Self-pay | Admitting: *Deleted

## 2014-09-30 DIAGNOSIS — I2510 Atherosclerotic heart disease of native coronary artery without angina pectoris: Secondary | ICD-10-CM | POA: Diagnosis not present

## 2014-09-30 DIAGNOSIS — K625 Hemorrhage of anus and rectum: Secondary | ICD-10-CM

## 2014-09-30 DIAGNOSIS — Z79899 Other long term (current) drug therapy: Secondary | ICD-10-CM | POA: Insufficient documentation

## 2014-09-30 DIAGNOSIS — K3189 Other diseases of stomach and duodenum: Secondary | ICD-10-CM | POA: Insufficient documentation

## 2014-09-30 DIAGNOSIS — E11319 Type 2 diabetes mellitus with unspecified diabetic retinopathy without macular edema: Secondary | ICD-10-CM | POA: Diagnosis not present

## 2014-09-30 DIAGNOSIS — Z888 Allergy status to other drugs, medicaments and biological substances status: Secondary | ICD-10-CM | POA: Diagnosis not present

## 2014-09-30 DIAGNOSIS — E782 Mixed hyperlipidemia: Secondary | ICD-10-CM | POA: Diagnosis not present

## 2014-09-30 DIAGNOSIS — Z8673 Personal history of transient ischemic attack (TIA), and cerebral infarction without residual deficits: Secondary | ICD-10-CM | POA: Diagnosis not present

## 2014-09-30 DIAGNOSIS — Z881 Allergy status to other antibiotic agents status: Secondary | ICD-10-CM | POA: Diagnosis not present

## 2014-09-30 DIAGNOSIS — Z7982 Long term (current) use of aspirin: Secondary | ICD-10-CM | POA: Diagnosis not present

## 2014-09-30 DIAGNOSIS — K921 Melena: Secondary | ICD-10-CM | POA: Diagnosis not present

## 2014-09-30 DIAGNOSIS — Z887 Allergy status to serum and vaccine status: Secondary | ICD-10-CM | POA: Diagnosis not present

## 2014-09-30 DIAGNOSIS — K640 First degree hemorrhoids: Secondary | ICD-10-CM | POA: Insufficient documentation

## 2014-09-30 DIAGNOSIS — Z87891 Personal history of nicotine dependence: Secondary | ICD-10-CM | POA: Diagnosis not present

## 2014-09-30 DIAGNOSIS — K449 Diaphragmatic hernia without obstruction or gangrene: Secondary | ICD-10-CM | POA: Insufficient documentation

## 2014-09-30 DIAGNOSIS — H269 Unspecified cataract: Secondary | ICD-10-CM | POA: Diagnosis not present

## 2014-09-30 DIAGNOSIS — I10 Essential (primary) hypertension: Secondary | ICD-10-CM | POA: Diagnosis not present

## 2014-09-30 DIAGNOSIS — K648 Other hemorrhoids: Secondary | ICD-10-CM | POA: Diagnosis not present

## 2014-09-30 DIAGNOSIS — D62 Acute posthemorrhagic anemia: Secondary | ICD-10-CM | POA: Insufficient documentation

## 2014-09-30 DIAGNOSIS — E1149 Type 2 diabetes mellitus with other diabetic neurological complication: Secondary | ICD-10-CM

## 2014-09-30 DIAGNOSIS — K219 Gastro-esophageal reflux disease without esophagitis: Secondary | ICD-10-CM | POA: Insufficient documentation

## 2014-09-30 DIAGNOSIS — K2970 Gastritis, unspecified, without bleeding: Secondary | ICD-10-CM | POA: Insufficient documentation

## 2014-09-30 DIAGNOSIS — K5730 Diverticulosis of large intestine without perforation or abscess without bleeding: Secondary | ICD-10-CM | POA: Diagnosis not present

## 2014-09-30 DIAGNOSIS — E119 Type 2 diabetes mellitus without complications: Secondary | ICD-10-CM

## 2014-09-30 LAB — COMPREHENSIVE METABOLIC PANEL
ALK PHOS: 88 U/L (ref 39–117)
ALT: 23 U/L (ref 0–35)
AST: 23 U/L (ref 0–37)
Albumin: 4.3 g/dL (ref 3.5–5.2)
Anion gap: 5 (ref 5–15)
BILIRUBIN TOTAL: 0.5 mg/dL (ref 0.3–1.2)
BUN: 18 mg/dL (ref 6–23)
CHLORIDE: 103 mmol/L (ref 96–112)
CO2: 26 mmol/L (ref 19–32)
CREATININE: 0.98 mg/dL (ref 0.50–1.10)
Calcium: 9.5 mg/dL (ref 8.4–10.5)
GFR, EST AFRICAN AMERICAN: 62 mL/min — AB (ref >90)
GFR, EST NON AFRICAN AMERICAN: 53 mL/min — AB (ref >90)
Glucose, Bld: 127 mg/dL — ABNORMAL HIGH (ref 70–99)
Potassium: 4.2 mmol/L (ref 3.5–5.1)
Sodium: 134 mmol/L — ABNORMAL LOW (ref 135–145)
Total Protein: 7.7 g/dL (ref 6.0–8.3)

## 2014-09-30 LAB — CBC WITH DIFFERENTIAL/PLATELET
Basophils Absolute: 0.1 10*3/uL (ref 0.0–0.1)
Basophils Relative: 1 % (ref 0–1)
Eosinophils Absolute: 0.2 10*3/uL (ref 0.0–0.7)
Eosinophils Relative: 4 % (ref 0–5)
HCT: 38.7 % (ref 36.0–46.0)
Hemoglobin: 13.1 g/dL (ref 12.0–15.0)
Lymphocytes Relative: 28 % (ref 12–46)
Lymphs Abs: 1.6 10*3/uL (ref 0.7–4.0)
MCH: 31 pg (ref 26.0–34.0)
MCHC: 33.9 g/dL (ref 30.0–36.0)
MCV: 91.7 fL (ref 78.0–100.0)
MONO ABS: 0.6 10*3/uL (ref 0.1–1.0)
Monocytes Relative: 10 % (ref 3–12)
Neutro Abs: 3.4 10*3/uL (ref 1.7–7.7)
Neutrophils Relative %: 57 % (ref 43–77)
PLATELETS: 260 10*3/uL (ref 150–400)
RBC: 4.22 MIL/uL (ref 3.87–5.11)
RDW: 12.5 % (ref 11.5–15.5)
WBC: 5.9 10*3/uL (ref 4.0–10.5)

## 2014-09-30 LAB — CBC
HEMATOCRIT: 36 % (ref 36.0–46.0)
Hemoglobin: 12.1 g/dL (ref 12.0–15.0)
MCH: 30.8 pg (ref 26.0–34.0)
MCHC: 33.6 g/dL (ref 30.0–36.0)
MCV: 91.6 fL (ref 78.0–100.0)
Platelets: 256 10*3/uL (ref 150–400)
RBC: 3.93 MIL/uL (ref 3.87–5.11)
RDW: 12.4 % (ref 11.5–15.5)
WBC: 7 10*3/uL (ref 4.0–10.5)

## 2014-09-30 LAB — GLUCOSE, CAPILLARY
Glucose-Capillary: 109 mg/dL — ABNORMAL HIGH (ref 70–99)
Glucose-Capillary: 91 mg/dL (ref 70–99)

## 2014-09-30 LAB — PROTIME-INR
INR: 0.97 (ref 0.00–1.49)
Prothrombin Time: 13 seconds (ref 11.6–15.2)

## 2014-09-30 LAB — TYPE AND SCREEN
ABO/RH(D): O NEG
Antibody Screen: NEGATIVE

## 2014-09-30 MED ORDER — RAMIPRIL 5 MG PO CAPS
5.0000 mg | ORAL_CAPSULE | Freq: Two times a day (BID) | ORAL | Status: DC
  Administered 2014-09-30 – 2014-10-04 (×8): 5 mg via ORAL
  Filled 2014-09-30 (×10): qty 1

## 2014-09-30 MED ORDER — CITALOPRAM HYDROBROMIDE 20 MG PO TABS
20.0000 mg | ORAL_TABLET | Freq: Every day | ORAL | Status: DC
  Administered 2014-09-30 – 2014-10-04 (×5): 20 mg via ORAL
  Filled 2014-09-30 (×6): qty 1

## 2014-09-30 MED ORDER — ALUM & MAG HYDROXIDE-SIMETH 200-200-20 MG/5ML PO SUSP: 30.0000 mL | Freq: Four times a day (QID) | ORAL | Status: DC | PRN

## 2014-09-30 MED ORDER — PRAVASTATIN SODIUM 10 MG PO TABS: 10.0000 mg | ORAL_TABLET | Freq: Every day | ORAL | Status: DC

## 2014-09-30 MED ORDER — TRAZODONE HCL 50 MG PO TABS: 25.0000 mg | ORAL_TABLET | Freq: Every evening | ORAL | Status: DC | PRN

## 2014-09-30 MED ORDER — ATORVASTATIN CALCIUM 10 MG PO TABS
10.0000 mg | ORAL_TABLET | Freq: Every day | ORAL | Status: DC
  Administered 2014-09-30 – 2014-10-03 (×4): 10 mg via ORAL
  Filled 2014-09-30 (×4): qty 1

## 2014-09-30 MED ORDER — ALPRAZOLAM 0.25 MG PO TABS: 0.2500 mg | ORAL_TABLET | Freq: Every evening | ORAL | Status: DC | PRN

## 2014-09-30 MED ORDER — MORPHINE SULFATE 2 MG/ML IJ SOLN: 1.0000 mg | INTRAMUSCULAR | Status: DC | PRN

## 2014-09-30 MED ORDER — ACETAMINOPHEN 325 MG PO TABS: 650.0000 mg | ORAL_TABLET | Freq: Four times a day (QID) | ORAL | Status: DC | PRN

## 2014-09-30 MED ORDER — ACETAMINOPHEN 650 MG RE SUPP: 650.0000 mg | Freq: Four times a day (QID) | RECTAL | Status: DC | PRN

## 2014-09-30 MED ORDER — AMLODIPINE BESYLATE 5 MG PO TABS
2.5000 mg | ORAL_TABLET | Freq: Every day | ORAL | Status: DC
  Administered 2014-09-30 – 2014-10-04 (×5): 2.5 mg via ORAL
  Filled 2014-09-30 (×6): qty 1

## 2014-09-30 MED ORDER — INSULIN ASPART 100 UNIT/ML ~~LOC~~ SOLN
0.0000 [IU] | Freq: Three times a day (TID) | SUBCUTANEOUS | Status: DC
  Administered 2014-10-02: 1 [IU] via SUBCUTANEOUS
  Administered 2014-10-03: 2 [IU] via SUBCUTANEOUS
  Administered 2014-10-04: 1 [IU] via SUBCUTANEOUS

## 2014-09-30 MED ORDER — PANTOPRAZOLE SODIUM 40 MG PO TBEC
40.0000 mg | DELAYED_RELEASE_TABLET | Freq: Every day | ORAL | Status: DC
  Administered 2014-09-30 – 2014-10-04 (×5): 40 mg via ORAL
  Filled 2014-09-30 (×6): qty 1

## 2014-09-30 MED ORDER — ONDANSETRON HCL 4 MG/2ML IJ SOLN: 4.0000 mg | Freq: Four times a day (QID) | INTRAMUSCULAR | Status: DC | PRN

## 2014-09-30 MED ORDER — ONDANSETRON HCL 4 MG PO TABS: 4.0000 mg | ORAL_TABLET | Freq: Four times a day (QID) | ORAL | Status: DC | PRN

## 2014-09-30 MED ORDER — METOPROLOL SUCCINATE ER 25 MG PO TB24
25.0000 mg | ORAL_TABLET | Freq: Every day | ORAL | Status: DC
  Administered 2014-09-30 – 2014-10-04 (×5): 25 mg via ORAL
  Filled 2014-09-30 (×6): qty 1

## 2014-09-30 MED ORDER — SODIUM CHLORIDE 0.9 % IV SOLN
INTRAVENOUS | Status: AC
  Administered 2014-09-30: 14:00:00 via INTRAVENOUS

## 2014-09-30 NOTE — H&P (Signed)
[facility] History and Physical  [name] EQA:[id number] DOB: 1933-10-16 DOA: 09/30/2014  Referring physician:  PCP: Rubbie Battiest, MD   Chief Complaint: Bright red blood per rectum  HPI: [name] is a 81 y.o. female with a past medical history that includes hypertension, CAD, diabetes, diverticulitis presents to the emergency department with the chief complaint of bright red blood per rectum. Being admitted for observation and monitoring. Patient reports she is in her usual state of health when this morning she had 2 back-to-back episodes of bright red blood per rectum. She states it was mostly blood with very little loose brown stool. She denies abdominal pain nausea vomiting cramping. So sedated symptoms include mild dizziness with position change but denies syncope or near-syncope. Her home medications include 1 baby aspirin a day and she denies any NSAID use. Reports having a colonoscopy approximately 5 years ago which yielded diverticulitis. Reports she was to have follow-up colonoscopy 10 years from that point due 5 years from now. She denies any fever chills dysuria hematuria headache visual disturbances. She denies any chest pain palpitation shortness of breath.  Workup in the emergency department includes complete blood count is unremarkable and a hemoglobin of 13.1. Basic metabolic panel was a sodium of 134 serum glucose 127 otherwise unremarkable She is afebrile mildly hypotensive and not hypoxic.  Review of Systems:  10 point review of systems complete and all systems are negative except as indicated in history of present illness Past Medical History  Diagnosis Date  . Essential hypertension, benign   . Type 2 diabetes mellitus   . GERD (gastroesophageal reflux disease)   . Cataracts, bilateral   . Diverticulitis   . Allergic rhinitis   . Syncope     Neurally mediated  . Coronary atherosclerosis of native coronary artery     DES LAD 3/12, 99%  nondominant RCA, No CAD otherwise, LVEF 65%  . Stroke, hemorrhagic     Right thalamic hemorrhage 4/12  . Diabetes mellitus   . Hyperlipidemia   . Stroke   . Diabetic retinopathy     mild   Past Surgical History  Procedure Laterality Date  . Breast cyst excision    . Cardiac catheterization    . Carotid stent  11/16/10   Social History:  reports that she quit smoking about 26 years ago. Her smoking use included Cigarettes. She smoked 0.25 packs per day. She has never used smokeless tobacco. She reports that she does not drink alcohol or use illicit drugs. 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 She is independent with ADLs Allergies  Allergen Reactions  . Macrodantin   . Pravachol [Pravastatin Sodium]   . Tetanus Toxoids     Family History  Problem Relation Age of Onset  . Pancreatic cancer Mother     Died at age 36  . Diabetes Mother   . Cancer Mother   . Coronary artery disease Father     Died in his 15s  . Heart attack Father   . Hypertension Father   . Breast cancer Sister   . Cancer Sister      Prior to Admission medications   Medication Sig Start Date End Date Taking? Authorizing Provider  ALPRAZolam Duanne Moron) 0.25 MG tablet TAKE ONE TABLET DAILY AS NEEDED. 07/20/14  Yes Mikey Kirschner, MD  amLODipine (NORVASC) 2.5 MG tablet TAKE ONE TABLET DAILY. 12/21/13  Yes Aloha Gell  Domenic Polite, MD  aspirin 81 MG tablet Take 81 mg by mouth daily.     Yes Historical Provider, MD  calcium citrate-vitamin D (CITRACAL+D) 315-200 MG-UNIT per tablet Take 1 tablet by mouth every other day.    Yes Historical Provider, MD  CINNAMON PO Take by mouth daily.   Yes Historical Provider, MD  citalopram (CELEXA) 20 MG tablet TAKE 1 AND 1/2 TABLETS BY MOUTH ONCE DAILY. 05/10/14  Yes Mikey Kirschner, MD  COCONUT OIL PO Take by mouth 2 (two) times daily.   Yes Historical Provider, MD  Coenzyme Q10 150 MG CAPS Take 150 mg by  mouth every other day.    Yes Historical Provider, MD  fluvastatin (LESCOL) 40 MG capsule TAKE (1) CAPSULE BY MOUTH EVERY DAY FOR INCREASED CHOLESTEROL. 09/19/14  Yes Mikey Kirschner, MD  metoprolol succinate (TOPROL-XL) 25 MG 24 hr tablet TAKE (1) TABLET BY MOUTH AT BEDTIME. 03/24/14  Yes Satira Sark, MD  Multiple Vitamins-Minerals (CENTRUM) tablet Take 1 tablet by mouth daily.    Yes Historical Provider, MD  Omega-3 Fatty Acids (FISH OIL) 1000 MG CAPS Take 1,000 mg by mouth daily.    Yes Historical Provider, MD  Psyllium (METAMUCIL PO) Take by mouth daily.   Yes Historical Provider, MD  ramipril (ALTACE) 5 MG capsule TAKE 1 CAPSULE BY MOUTH TWICE DAILY. 09/19/14  Yes Mikey Kirschner, MD  TURMERIC PO Take by mouth 2 (two) times daily.   Yes Historical Provider, MD  vitamin B-12 (CYANOCOBALAMIN) 100 MCG tablet Take 100 mcg by mouth daily.   Yes Historical Provider, MD  ACCU-CHEK AVIVA PLUS test strip USE TO TEST BLOOD SUGAR DAILY. 09/19/14   Mikey Kirschner, MD  nitroGLYCERIN (NITROSTAT) 0.4 MG SL tablet Dissolve 1 tablet under tongue every 5 mins up to 15 mins for chest pain. If no relief call 911. 09/23/13   Mikey Kirschner, MD   Physical Exam: Filed Vitals:   09/30/14 1147 09/30/14 1200 09/30/14 1241 09/30/14 1300  BP: 175/63 172/61 173/72 179/62  Pulse: 60 59 60 59  Temp:      TempSrc:      Resp:   18   Height:      Weight:      SpO2: 99% 98% 98% 97%    Wt Readings from Last 3 Encounters:  09/30/14 68.04 kg (150 lb)  04/27/14 72.122 kg (159 lb)  03/02/14 71.85 kg (158 lb 6.4 oz)    General:  Appears calm and comfortable, well nourished Eyes: PERRL, normal lids, irises & conjunctiva ENT: grossly normal hearing, lips & tongue, because membranes of her mouth are pink slightly dry Neck: no LAD, masses or thyromegaly Cardiovascular: RRR, no m/r/g. No LE edema. Pulses present and palpable  Respiratory: CTA bilaterally, no w/r/r. Normal respiratory effort. Abdomen: soft, ntnd  nondistended positive bowel sounds Skin: no rash or induration seen on limited exam Musculoskeletal: grossly normal tone BUE/BLE Psychiatric: grossly normal mood and affect, speech fluent and appropriate Neurologic: grossly non-focal. Speech clear facial symmetry           Labs on Admission:  Basic Metabolic Panel:  Recent Labs Lab 09/30/14 1104  NA 134*  K 4.2  CL 103  CO2 26  GLUCOSE 127*  BUN 18  CREATININE 0.98  CALCIUM 9.5   Liver Function Tests:  Recent Labs Lab 09/30/14 1104  AST 23  ALT 23  ALKPHOS 88  BILITOT 0.5  PROT 7.7  ALBUMIN 4.3   No results for  input(s): LIPASE, AMYLASE in the last 168 hours. No results for input(s): AMMONIA in the last 168 hours. CBC:  Recent Labs Lab 09/30/14 1104  WBC 5.9  NEUTROABS 3.4  HGB 13.1  HCT 38.7  MCV 91.7  PLT 260   Cardiac Enzymes: No results for input(s): CKTOTAL, CKMB, CKMBINDEX, TROPONINI in the last 168 hours.  BNP (last 3 results) No results for input(s): PROBNP in the last 8760 hours. CBG: No results for input(s): GLUCAP in the last 168 hours.  Radiological Exams on Admission: No results found.  EKG:  Assessment/Plan Active Problems: Right red blood per rectum: In patient with history of diverticulitis. Most recent colonoscopy 5 years ago. She reports seeing Dr.Rehman. No further episodes since her presentation to the emergency department. Hemoglobin is stable at 13. Will admit for close monitoring. Will get serial CBCs. ED exam yields one small external hemorrhoid. Will provide clear liquids. Hold her aspirin for now. Monitor. Will likely be able to discharge tomorrow and follow-up with gastroenterology on an outpatient basis.   Diverticulitis: per colonoscopy 5 years ago. See #1. No evidence complications. She is afebrile and non-toxic appearing.    Essential hypertension, benign; pressure on the high side of normal patient has not taken her medications yet today. Home medications include  amlodipine, metoprolol, ramipril. Continue these for now and monitor closely.   Type 2 diabetes mellitus: Diet controlled. Will provide clear liquid diet for now. Obtain a hemoglobin A1c and monitor    Mixed hyperlipidemia: In today home medications     GERD: Stable.    Coronary atherosclerosis of native coronary artery: Patient is followed by Dr. Domenic Polite. Straight recent visit August 2015. Office note from that visit indicates CAD symptomatically stable with a normal ECG plan to continue medical regimen.   Stroke. Review indicates right thalami neck hemorrhage 2012. Takes a baby aspirin every day as well as a statin. Her ports memory deficit only.      Code Status: full DVT Prophylaxis: Family Communication: son at bedside Disposition Plan: likley home in am   Time spent:   [facility] Pager [phone]

## 2014-09-30 NOTE — ED Notes (Signed)
Report given to [facility] for room 330.

## 2014-09-30 NOTE — Telephone Encounter (Signed)
Patient's husband called and stated his wife started passing blood thru her stool this am. Consult with Dr Richardson Landry- Patient needs to go straight to ER-Husband advised and verbalized understanding and stated he was going to take her to ER.

## 2014-09-30 NOTE — ED Notes (Signed)
PT reports has had 2 episodes of passing bright red blood in stool this morning.  Denies any pain or n/v.  Reports generalized weakness.

## 2014-09-30 NOTE — ED Notes (Signed)
Hospitalist at bedside 

## 2014-09-30 NOTE — ED Provider Notes (Signed)
CSN: [id number]     Arrival date & time 09/30/14  1046 History  This chart was scribed for Johnna Acosta, MD by Tula Nakayama, ED Scribe. This patient was seen in room APA19/APA19 and the patient's care was started at 11:09 AM.    Chief Complaint  Patient presents with  . GI Bleeding   The history is provided by the patient. No language interpreter was used.   HPI Comments: [name] is a 81 y.o. female with a history of diverticulitis who presents to the Emergency Department complaining of 2 back-to-back bright red stools that occurred this morning. Pt notes her second BM this morning had a lot of blood, but very little stool. She states light-headedness and dizziness as associated symptoms. Pt takes baby Aspirin daily, but denies taking other anti-coagulants. She had colonscopy within the last 5 years and endorsed diverticulitis. Pt denies recent injury, history of GI bleeding, blood transfusion, or major surgeries. She also denies abdominal pain, cramping, SOB, leg swelling, blurred vision, nausea, vomiting and diarrhea as associated symptoms.  Past Medical History  Diagnosis Date  . Essential hypertension, benign   . Type 2 diabetes mellitus   . GERD (gastroesophageal reflux disease)   . Cataracts, bilateral   . Diverticulitis   . Allergic rhinitis   . Syncope     Neurally mediated  . Coronary atherosclerosis of native coronary artery     DES LAD 3/12, 99% nondominant RCA, No CAD otherwise, LVEF 65%  . Stroke, hemorrhagic     Right thalamic hemorrhage 4/12  . Diabetes mellitus   . Hyperlipidemia   . Stroke   . Diabetic retinopathy     mild   Past Surgical History  Procedure Laterality Date  . Breast cyst excision    . Cardiac catheterization    . Carotid stent  11/16/10   Family History  Problem Relation Age of Onset  . Pancreatic cancer Mother     Died at age 62  . Diabetes Mother   . Cancer Mother   . Coronary artery disease Father     Died in his 20s   . Heart attack Father   . Hypertension Father   . Breast cancer Sister   . Cancer Sister    History  Substance Use Topics  . Smoking status: Former Smoker -- 0.25 packs/day    Types: Cigarettes    Quit date: 09/02/1988  . Smokeless tobacco: Never Used  . Alcohol Use: No   OB History    No data available     Review of Systems  Eyes: Negative for visual disturbance.  Respiratory: Negative for shortness of breath.   Cardiovascular: Negative for leg swelling.  Gastrointestinal: Positive for blood in stool. Negative for nausea, vomiting, abdominal pain and diarrhea.  All other systems reviewed and are negative.     Allergies  Macrodantin; Pravachol; and Tetanus toxoids  Home Medications   Prior to Admission medications   Medication Sig Start Date End Date Taking? Authorizing Provider  ALPRAZolam Duanne Moron) 0.25 MG tablet TAKE ONE TABLET DAILY AS NEEDED. 07/20/14  Yes Mikey Kirschner, MD  amLODipine (NORVASC) 2.5 MG tablet TAKE ONE TABLET DAILY. 12/21/13  Yes Satira Sark, MD  aspirin 81 MG tablet Take 81 mg by mouth daily.     Yes Historical Provider, MD  calcium citrate-vitamin D (CITRACAL+D) 315-200 MG-UNIT per tablet Take 1 tablet by mouth every other day.    Yes Historical Provider, MD  CINNAMON PO Take by  mouth daily.   Yes Historical Provider, MD  citalopram (CELEXA) 20 MG tablet TAKE 1 AND 1/2 TABLETS BY MOUTH ONCE DAILY. 05/10/14  Yes Mikey Kirschner, MD  COCONUT OIL PO Take by mouth 2 (two) times daily.   Yes Historical Provider, MD  Coenzyme Q10 150 MG CAPS Take 150 mg by mouth every other day.    Yes Historical Provider, MD  fluvastatin (LESCOL) 40 MG capsule TAKE (1) CAPSULE BY MOUTH EVERY DAY FOR INCREASED CHOLESTEROL. 09/19/14  Yes Mikey Kirschner, MD  metoprolol succinate (TOPROL-XL) 25 MG 24 hr tablet TAKE (1) TABLET BY MOUTH AT BEDTIME. 03/24/14  Yes Satira Sark, MD  Multiple Vitamins-Minerals (CENTRUM) tablet Take 1 tablet by mouth daily.    Yes  Historical Provider, MD  Omega-3 Fatty Acids (FISH OIL) 1000 MG CAPS Take 1,000 mg by mouth daily.    Yes Historical Provider, MD  Psyllium (METAMUCIL PO) Take by mouth daily.   Yes Historical Provider, MD  ramipril (ALTACE) 5 MG capsule TAKE 1 CAPSULE BY MOUTH TWICE DAILY. 09/19/14  Yes Mikey Kirschner, MD  TURMERIC PO Take by mouth 2 (two) times daily.   Yes Historical Provider, MD  vitamin B-12 (CYANOCOBALAMIN) 100 MCG tablet Take 100 mcg by mouth daily.   Yes Historical Provider, MD  ACCU-CHEK AVIVA PLUS test strip USE TO TEST BLOOD SUGAR DAILY. 09/19/14   Mikey Kirschner, MD  nitroGLYCERIN (NITROSTAT) 0.4 MG SL tablet Dissolve 1 tablet under tongue every 5 mins up to 15 mins for chest pain. If no relief call 911. 09/23/13   Mikey Kirschner, MD   BP 175/63 mmHg  Pulse 60  Temp(Src) 97.7 F (36.5 C) (Oral)  Resp 18  Ht 5\' 2"  (1.575 m)  Wt 150 lb (68.04 kg)  BMI 27.43 kg/m2  SpO2 99%   Physical Exam  Constitutional: She appears well-developed and well-nourished. No distress.  HENT:  Head: Normocephalic and atraumatic.  Mouth/Throat: Oropharynx is clear and moist. No oropharyngeal exudate.  Eyes: Conjunctivae and EOM are normal. Pupils are equal, round, and reactive to light. Right eye exhibits no discharge. Left eye exhibits no discharge. No scleral icterus.  Neck: Normal range of motion. Neck supple. No JVD present. No thyromegaly present.  Cardiovascular: Normal rate, regular rhythm, normal heart sounds and intact distal pulses.  Exam reveals no gallop and no friction rub.   No murmur heard. Pulmonary/Chest: Effort normal and breath sounds normal. No respiratory distress. She has no wheezes. She has no rales.  Abdominal: Soft. Bowel sounds are normal. She exhibits no distension and no mass. There is no tenderness.  Genitourinary:  Bright small external hemorrhoids at 3 o'clock; non-tender; non-thrombost; small amount of bloody stool in the rectal vault; no internal hemorrhoids felt   Musculoskeletal: Normal range of motion. She exhibits no edema or tenderness.  Lymphadenopathy:    She has no cervical adenopathy.  Neurological: She is alert. Coordination normal.  Skin: Skin is warm and dry. No rash noted. No erythema.  Psychiatric: She has a normal mood and affect. Her behavior is normal.  Nursing note and vitals reviewed. Chaperone (scribe) was present for exam which was performed with no discomfort or complications.   ED Course  Procedures (including critical care time) DIAGNOSTIC STUDIES: Oxygen Saturation is 95% on RA, normal by my interpretation.    COORDINATION OF CARE: 11:20 AM Discussed treatment plan with pt which includes lab work. Pt agreed to plan.  Labs Review Labs Reviewed  COMPREHENSIVE METABOLIC  PANEL - Abnormal; Notable for the following:    Sodium 134 (*)    Glucose, Bld 127 (*)    GFR calc non Af Amer 53 (*)    GFR calc Af Amer 62 (*)    All other components within normal limits  CBC WITH DIFFERENTIAL/PLATELET  PROTIME-INR  TYPE AND SCREEN    Imaging Review No results found.    MDM   Final diagnoses:  Rectal bleed    Rectal bleeding present, mild hypertension, no signs of hemodynamic instability, hemoglobin is normal, discussed with hospitalist to a bed for observation and serial hemoglobin is.  I personally performed the services described in this documentation, which was scribed in my presence. The recorded information has been reviewed and is accurate.      Johnna Acosta, MD 09/30/14 1230

## 2014-10-01 DIAGNOSIS — K5731 Diverticulosis of large intestine without perforation or abscess with bleeding: Secondary | ICD-10-CM

## 2014-10-01 DIAGNOSIS — K921 Melena: Secondary | ICD-10-CM | POA: Diagnosis not present

## 2014-10-01 DIAGNOSIS — K219 Gastro-esophageal reflux disease without esophagitis: Secondary | ICD-10-CM

## 2014-10-01 LAB — GLUCOSE, CAPILLARY
GLUCOSE-CAPILLARY: 108 mg/dL — AB (ref 70–99)
GLUCOSE-CAPILLARY: 103 mg/dL — AB (ref 70–99)
GLUCOSE-CAPILLARY: 106 mg/dL — AB (ref 70–99)
Glucose-Capillary: 117 mg/dL — ABNORMAL HIGH (ref 70–99)

## 2014-10-01 LAB — CBC
HEMATOCRIT: 34.8 % — AB (ref 36.0–46.0)
HEMOGLOBIN: 11.4 g/dL — AB (ref 12.0–15.0)
MCH: 30.3 pg (ref 26.0–34.0)
MCHC: 32.8 g/dL (ref 30.0–36.0)
MCV: 92.6 fL (ref 78.0–100.0)
Platelets: 236 10*3/uL (ref 150–400)
RBC: 3.76 MIL/uL — AB (ref 3.87–5.11)
RDW: 12.8 % (ref 11.5–15.5)
WBC: 5.1 10*3/uL (ref 4.0–10.5)

## 2014-10-01 LAB — HEMOGLOBIN A1C
Hgb A1c MFr Bld: 6.5 % — ABNORMAL HIGH (ref 4.8–5.6)
Mean Plasma Glucose: 140 mg/dL

## 2014-10-01 MED ORDER — PEG 3350-KCL-NA BICARB-NACL 420 G PO SOLR
2000.0000 mL | Freq: Once | ORAL | Status: AC
  Administered 2014-10-01: 2000 mL via ORAL
  Filled 2014-10-01: qty 4000

## 2014-10-01 MED ORDER — PEG 3350-KCL-NA BICARB-NACL 420 G PO SOLR
2000.0000 mL | Freq: Once | ORAL | Status: AC
  Administered 2014-10-02: 2000 mL via ORAL

## 2014-10-01 NOTE — Discharge Summary (Signed)
[facility] PROGRESS NOTE  DINITA MIGLIACCIO GXQ:[id number] DOB: 01/16/1934 DOA: 09/30/2014 PCP: Rubbie Battiest, MD  Assessment/Plan: 1. GI bleeding. Patient admitted to the hospital with BRBPR. She has been seen by GI and plans are for colonoscopy to be done tomorrow. Continue PPI for now. She has not had further bleeding since admission 2. Mild acute blood loss anemia. Continue to follow cbc. Does not need transfusion at this time. 3. GERD. Stable 4. HTN. Stable on current regimen 5. CAD. No active chest pain. Continue current management  Code Status: full code Family Communication: discussed with patient and family  Disposition Plan: discharge home once improved   Consultants:  Gastroenterology  Procedures:    Antibiotics:    HPI/Subjective: No further bleeding. No new complaints  Objective: Filed Vitals:   10/01/14 1011  BP: 126/60  Pulse:   Temp:   Resp:     Intake/Output Summary (Last 24 hours) at 10/01/14 1549 Last data filed at 09/30/14 2359  Gross per 24 hour  Intake    600 ml  Output    700 ml  Net   -100 ml   Filed Weights   09/30/14 1056  Weight: 68.04 kg (150 lb)    Exam:   General:  NAD  Cardiovascular: S1, S2 RRR  Respiratory: CTA   Abdomen: soft, nt, nd, bs+  Musculoskeletal: no edema b/l   Data Reviewed: Basic Metabolic Panel:  Recent Labs Lab 09/30/14 1104  NA 134*  K 4.2  CL 103  CO2 26  GLUCOSE 127*  BUN 18  CREATININE 0.98  CALCIUM 9.5   Liver Function Tests:  Recent Labs Lab 09/30/14 1104  AST 23  ALT 23  ALKPHOS 88  BILITOT 0.5  PROT 7.7  ALBUMIN 4.3   No results for input(s): LIPASE, AMYLASE in the last 168 hours. No results for input(s): AMMONIA in the last 168 hours. CBC:  Recent Labs Lab 09/30/14 1104 09/30/14 1826 10/01/14 0640  WBC 5.9 7.0 5.1  NEUTROABS 3.4  --   --   HGB 13.1 12.1 11.4*  HCT 38.7 36.0 34.8*  MCV 91.7 91.6 92.6  PLT 260 256 236   Cardiac Enzymes: No results  for input(s): CKTOTAL, CKMB, CKMBINDEX, TROPONINI in the last 168 hours. BNP (last 3 results) No results for input(s): PROBNP in the last 8760 hours. CBG:  Recent Labs Lab 09/30/14 1643 09/30/14 2143 10/01/14 0744 10/01/14 1140  GLUCAP 109* 91 108* 117*    No results found for this or any previous visit (from the past 240 hour(s)).   Studies: No results found.  Scheduled Meds: . amLODipine  2.5 mg Oral Daily  . atorvastatin  10 mg Oral q1800  . citalopram  20 mg Oral Daily  . insulin aspart  0-9 Units Subcutaneous TID WC  . metoprolol succinate  25 mg Oral Daily  . pantoprazole  40 mg Oral Daily  . [START ON 10/02/2014] polyethylene glycol-electrolytes  2,000 mL Oral Once  . ramipril  5 mg Oral BID   Continuous Infusions:   Active Problems:   Mixed hyperlipidemia   Essential hypertension, benign   GERD   Coronary atherosclerosis of native coronary artery   Type 2 diabetes mellitus   Rectal bleed   BRBPR (bright red blood per rectum)    Time spent: 103mins    MEMON,JEHANZEB  [facility] Pager [phone]. If 7PM-7AM, please contact night-coverage at [email or web address], password [facility] 10/01/2014, 3:49 PM  LOS: 1 day

## 2014-10-01 NOTE — Consult Note (Addendum)
Referring Provider: No ref. provider found Primary Care Physician:  Rubbie Battiest, MD Primary Gastroenterologist:  Dr.  Luiz Iron for Consultation:  Hematochezia  HPI:  Very pleasant 81 year old lady admitted through the emergency department last evening with multiple episodes of gross bright red blood per rectum. This was associated with no abdominal pain, whatsoever. No recent change in bowel habits as far as straining,  constipation or diarrhea concerned.  Initial hemoglobin 13.1; this morning 11.4;has had no further bleeding. She has remained hemodynamically stable on department 300. Patient endorses one 81 mg aspirin daily. No other NSAIDs. No anticoagulation medication, Plavix, etc. Furthermore, she denies chronic symptoms of odynophagia, dysphagia, early satiety, nausea vomiting or any change in weight.  Last colonoscopy was in 2005-done for screening. She is found to have hemorrhoids and pancolonic diverticulosis. No history of peptic ulcer disease or other GI illness although GERD is listed in her past medical history.. No prior EGD.  No acid suppression therapy at home. No family history of GI neoplasia.    Past Medical History  Diagnosis Date  . Essential hypertension, benign   . Type 2 diabetes mellitus   . GERD (gastroesophageal reflux disease)   . Cataracts, bilateral   . Diverticulitis   . Allergic rhinitis   . Syncope     Neurally mediated  . Coronary atherosclerosis of native coronary artery     DES LAD 3/12, 99% nondominant RCA, No CAD otherwise, LVEF 65%  . Stroke, hemorrhagic     Right thalamic hemorrhage 4/12  . Diabetes mellitus   . Hyperlipidemia   . Stroke   . Diabetic retinopathy     mild    Past Surgical History  Procedure Laterality Date  . Breast cyst excision    . Cardiac catheterization    . Carotid stent  11/16/10    Prior to Admission medications   Medication Sig Start Date End Date Taking? Authorizing Provider  ALPRAZolam Duanne Moron) 0.25 MG  tablet TAKE ONE TABLET DAILY AS NEEDED. 07/20/14  Yes Mikey Kirschner, MD  amLODipine (NORVASC) 2.5 MG tablet TAKE ONE TABLET DAILY. 12/21/13  Yes Satira Sark, MD  aspirin 81 MG tablet Take 81 mg by mouth daily.     Yes Historical Provider, MD  calcium citrate-vitamin D (CITRACAL+D) 315-200 MG-UNIT per tablet Take 1 tablet by mouth every other day.    Yes Historical Provider, MD  CINNAMON PO Take by mouth daily.   Yes Historical Provider, MD  citalopram (CELEXA) 20 MG tablet TAKE 1 AND 1/2 TABLETS BY MOUTH ONCE DAILY. 05/10/14  Yes Mikey Kirschner, MD  COCONUT OIL PO Take by mouth 2 (two) times daily.   Yes Historical Provider, MD  Coenzyme Q10 150 MG CAPS Take 150 mg by mouth every other day.    Yes Historical Provider, MD  fluvastatin (LESCOL) 40 MG capsule TAKE (1) CAPSULE BY MOUTH EVERY DAY FOR INCREASED CHOLESTEROL. 09/19/14  Yes Mikey Kirschner, MD  metoprolol succinate (TOPROL-XL) 25 MG 24 hr tablet TAKE (1) TABLET BY MOUTH AT BEDTIME. 03/24/14  Yes Satira Sark, MD  Multiple Vitamins-Minerals (CENTRUM) tablet Take 1 tablet by mouth daily.    Yes Historical Provider, MD  Omega-3 Fatty Acids (FISH OIL) 1000 MG CAPS Take 1,000 mg by mouth daily.    Yes Historical Provider, MD  Psyllium (METAMUCIL PO) Take by mouth daily.   Yes Historical Provider, MD  ramipril (ALTACE) 5 MG capsule TAKE 1 CAPSULE BY MOUTH TWICE DAILY. 09/19/14  Yes  Mikey Kirschner, MD  TURMERIC PO Take by mouth 2 (two) times daily.   Yes Historical Provider, MD  vitamin B-12 (CYANOCOBALAMIN) 100 MCG tablet Take 100 mcg by mouth daily.   Yes Historical Provider, MD  ACCU-CHEK AVIVA PLUS test strip USE TO TEST BLOOD SUGAR DAILY. 09/19/14   Mikey Kirschner, MD  nitroGLYCERIN (NITROSTAT) 0.4 MG SL tablet Dissolve 1 tablet under tongue every 5 mins up to 15 mins for chest pain. If no relief call 911. 09/23/13   Mikey Kirschner, MD    Current Facility-Administered Medications  Medication Dose Route Frequency Provider  Last Rate Last Dose  . acetaminophen (TYLENOL) tablet 650 mg  650 mg Oral Q6H PRN Radene Gunning, NP       Or  . acetaminophen (TYLENOL) suppository 650 mg  650 mg Rectal Q6H PRN Radene Gunning, NP      . ALPRAZolam Duanne Moron) tablet 0.25 mg  0.25 mg Oral QHS PRN Radene Gunning, NP      . alum & mag hydroxide-simeth (MAALOX/MYLANTA) 200-200-20 MG/5ML suspension 30 mL  30 mL Oral Q6H PRN Radene Gunning, NP      . amLODipine (NORVASC) tablet 2.5 mg  2.5 mg Oral Daily Lezlie Octave Black, NP   2.5 mg at 09/30/14 1424  . atorvastatin (LIPITOR) tablet 10 mg  10 mg Oral q1800 Kathie Dike, MD   10 mg at 09/30/14 1848  . citalopram (CELEXA) tablet 20 mg  20 mg Oral Daily Radene Gunning, NP   20 mg at 09/30/14 1424  . insulin aspart (novoLOG) injection 0-9 Units  0-9 Units Subcutaneous TID WC Radene Gunning, NP   0 Units at 09/30/14 1700  . metoprolol succinate (TOPROL-XL) 24 hr tablet 25 mg  25 mg Oral Daily Radene Gunning, NP   25 mg at 09/30/14 1423  . morphine 2 MG/ML injection 1 mg  1 mg Intravenous Q3H PRN Radene Gunning, NP      . ondansetron [facility]) tablet 4 mg  4 mg Oral Q6H PRN Radene Gunning, NP       Or  . ondansetron [facility]) injection 4 mg  4 mg Intravenous Q6H PRN Radene Gunning, NP      . pantoprazole (PROTONIX) EC tablet 40 mg  40 mg Oral Daily Kathie Dike, MD   40 mg at 09/30/14 1848  . ramipril (ALTACE) capsule 5 mg  5 mg Oral BID Radene Gunning, NP   5 mg at 09/30/14 2233  . traZODone (DESYREL) tablet 25 mg  25 mg Oral QHS PRN Radene Gunning, NP        Allergies as of 09/30/2014 - Review Complete 09/30/2014  Allergen Reaction Noted  . Macrodantin  11/15/2010  . Pravachol [pravastatin sodium]  01/02/2013  . Tetanus toxoids  01/02/2013    Family History  Problem Relation Age of Onset  . Pancreatic cancer Mother     Died at age 73  . Diabetes Mother   . Cancer Mother   . Coronary artery disease Father     Died in his 55s  . Heart attack Father   . Hypertension Father   . Breast cancer  Sister   . Cancer Sister     History   Social History  . Marital Status: Married    Spouse Name: N/A    Number of Children: 2  . Years of Education: N/A   Occupational History  . Retired  Social History Main Topics  . Smoking status: Former Smoker -- 0.25 packs/day    Types: Cigarettes    Quit date: 09/02/1988  . Smokeless tobacco: Never Used  . Alcohol Use: No  . Drug Use: No  . Sexual Activity: No   Other Topics Concern  . Not on file   Social History Narrative    Review of Systems: Gen: Denies any fever, chills, sweats, anorexia, fatigue, weakness, malaise, weight loss, and sleep disorder CV: Denies chest pain, angina, palpitations, syncope, orthopnea, PND, peripheral edema, and claudication. Resp: Denies dyspnea at rest, dyspnea with exercise, cough, sputum, wheezing, coughing up blood, and pleurisy. GI: Denies vomiting blood, jaundice, and fecal incontinence.   Denies dysphagia or odynophagia. Derm: Denies rash, itching, dry skin, hives, moles, warts, or unhealing ulcers.   Physical Exam: Vital signs in last 24 hours: Temp:  [97.7 F (36.5 C)-98.1 F (36.7 C)] 98 F (36.7 C) (01/30 0558) Pulse Rate:  [53-68] 53 (01/30 0558) Resp:  [18-20] 18 (01/30 0558) BP: (122-196)/(45-72) 123/54 mmHg (01/30 0558) SpO2:  [94 %-99 %] 95 % (01/30 0558) Weight:  [150 lb (68.04 kg)] 150 lb (68.04 kg) (01/29 1056) Last BM Date: 09/30/14 General:   Alert,  Well-developed, well-nourished, pleasant and cooperative in NAD. She is accompanied by her husband and son. Head:  Normocephalic and atraumatic. Eyes:  Sclera clear, no icterus.   Conjunctiva pink. Ears:  Normal auditory acuity. Nose:  No deformity, discharge,  or lesions. Mouth:  No deformity or lesions, dentition normal. Neck:  Supple; no masses or thyromegaly. Lungs:  Clear throughout to auscultation.   No wheezes, crackles, or rhonchi. No acute distress. Heart:  Regular rate and rhythm; no murmurs, clicks, rubs,  or  gallops. Abdomen:  Soft, nontender and nondistended. No masses, hepatosplenomegaly or hernias noted. Normal bowel sounds, without guarding, and without rebound.   Rectal:  Deferred until time of colonoscopy.   Msk:  Symmetrical without gross deformities. Normal posture. Pulses:  Normal pulses noted. Extremities:  Without clubbing or edema. Neurologic:  Alert and  oriented x4;  grossly normal neurologically. Intake/Output from previous day: 01/29 0701 - 01/30 0700 In: 600 [P.O.:600] Out: 700 [Urine:700] Intake/Output this shift:    Lab Results:  Recent Labs  09/30/14 1104 09/30/14 1826 10/01/14 0640  WBC 5.9 7.0 5.1  HGB 13.1 12.1 11.4*  HCT 38.7 36.0 34.8*  PLT 260 256 236   BMET  Recent Labs  09/30/14 1104  NA 134*  K 4.2  CL 103  CO2 26  GLUCOSE 127*  BUN 18  CREATININE 0.98  CALCIUM 9.5   LFT  Recent Labs  09/30/14 1104  PROT 7.7  ALBUMIN 4.3  AST 23  ALT 23  ALKPHOS 88  BILITOT 0.5   PT/INR  Recent Labs  09/30/14 1125  LABPROT 13.0  INR 0.97   Impression:  Patient is a pleasant 81 year old lady with multiple episodes of painless hematochezia yesterday necessitating hospitalization. She has had nearly a 2 g drop in hemoglobin.  Some of this decline likely related to hemodilution with IV fluids. She remains hemodynamically stable. History of known hemorrhoids and pancolonic diverticulosis.  I doubt a rapid transit upper GI bleed source of bleeding.  However, this possibility would remain in the differential at this time. It's been 11 years since she had a colonoscopy.   Patient ought to have a GI evaluation. We discussed timing of evaluation including close interval follow-up outpatient evaluation versus proceeding with endoscopic evaluation while she is  here. After some discussion, it was decided that inpatient evaluation would be in her best interest.  Recommendations:  Diagnostic colonoscopy tomorrow. I explained to the patient and family if this  examination does not clearly delineate the cause of  bleeding, I would go ahead and perform an EGD  to complete her inpatient evaluation. Agree with PPI therapy. The risks, benefits, limitations, imponderables and alternatives regarding both EGD and colonoscopy have been reviewed with the patient. Questions have been answered. All parties agreeable.       Notice:  This dictation was prepared with Dragon dictation along with smaller phrase technology. Any transcriptional errors that result from this process are unintentional and may not be corrected upon review.

## 2014-10-01 NOTE — Progress Notes (Signed)
Patient has had several stool since prep started. Bloody stools noted. Patient tolerated prep well.

## 2014-10-02 ENCOUNTER — Encounter (HOSPITAL_COMMUNITY): Payer: Self-pay | Admitting: *Deleted

## 2014-10-02 ENCOUNTER — Encounter (HOSPITAL_COMMUNITY)
Admission: EM | Disposition: A | Discharge status: Home / Self Care | Payer: Self-pay | Source: Home / Self Care | Attending: Emergency Medicine

## 2014-10-02 DIAGNOSIS — K921 Melena: Secondary | ICD-10-CM | POA: Diagnosis not present

## 2014-10-02 DIAGNOSIS — H269 Unspecified cataract: Secondary | ICD-10-CM | POA: Diagnosis not present

## 2014-10-02 DIAGNOSIS — E11319 Type 2 diabetes mellitus with unspecified diabetic retinopathy without macular edema: Secondary | ICD-10-CM | POA: Diagnosis not present

## 2014-10-02 DIAGNOSIS — K219 Gastro-esophageal reflux disease without esophagitis: Secondary | ICD-10-CM | POA: Diagnosis not present

## 2014-10-02 DIAGNOSIS — K449 Diaphragmatic hernia without obstruction or gangrene: Secondary | ICD-10-CM | POA: Diagnosis not present

## 2014-10-02 DIAGNOSIS — K640 First degree hemorrhoids: Secondary | ICD-10-CM

## 2014-10-02 DIAGNOSIS — K3189 Other diseases of stomach and duodenum: Secondary | ICD-10-CM

## 2014-10-02 DIAGNOSIS — I10 Essential (primary) hypertension: Secondary | ICD-10-CM | POA: Diagnosis not present

## 2014-10-02 DIAGNOSIS — K5730 Diverticulosis of large intestine without perforation or abscess without bleeding: Secondary | ICD-10-CM | POA: Diagnosis not present

## 2014-10-02 DIAGNOSIS — D62 Acute posthemorrhagic anemia: Secondary | ICD-10-CM

## 2014-10-02 DIAGNOSIS — K2970 Gastritis, unspecified, without bleeding: Secondary | ICD-10-CM | POA: Diagnosis not present

## 2014-10-02 DIAGNOSIS — K648 Other hemorrhoids: Secondary | ICD-10-CM | POA: Diagnosis not present

## 2014-10-02 HISTORY — PX: ESOPHAGOGASTRODUODENOSCOPY: SHX1529

## 2014-10-02 HISTORY — PX: COLONOSCOPY: SHX5424

## 2014-10-02 LAB — GLUCOSE, CAPILLARY
GLUCOSE-CAPILLARY: 106 mg/dL — AB (ref 70–99)
Glucose-Capillary: 143 mg/dL — ABNORMAL HIGH (ref 70–99)
Glucose-Capillary: 90 mg/dL (ref 70–99)
Glucose-Capillary: 117 mg/dL — ABNORMAL HIGH (ref 70–99)

## 2014-10-02 LAB — CBC
HEMATOCRIT: 34.6 % — AB (ref 36.0–46.0)
HEMOGLOBIN: 11.6 g/dL — AB (ref 12.0–15.0)
MCH: 31 pg (ref 26.0–34.0)
MCHC: 33.5 g/dL (ref 30.0–36.0)
MCV: 92.5 fL (ref 78.0–100.0)
Platelets: 231 10*3/uL (ref 150–400)
RBC: 3.74 MIL/uL — AB (ref 3.87–5.11)
RDW: 12.6 % (ref 11.5–15.5)
WBC: 5.2 10*3/uL (ref 4.0–10.5)

## 2014-10-02 LAB — BASIC METABOLIC PANEL
Anion gap: 9 (ref 5–15)
BUN: 9 mg/dL (ref 6–23)
CALCIUM: 9.2 mg/dL (ref 8.4–10.5)
CO2: 27 mmol/L (ref 19–32)
Chloride: 105 mmol/L (ref 96–112)
Creatinine, Ser: 0.95 mg/dL (ref 0.50–1.10)
GFR calc non Af Amer: 55 mL/min — ABNORMAL LOW (ref >90)
GFR, EST AFRICAN AMERICAN: 64 mL/min — AB (ref >90)
GLUCOSE: 111 mg/dL — AB (ref 70–99)
POTASSIUM: 3.9 mmol/L (ref 3.5–5.1)
Sodium: 141 mmol/L (ref 135–145)

## 2014-10-02 SURGERY — COLONOSCOPY
Anesthesia: Moderate Sedation

## 2014-10-02 MED ORDER — LIDOCAINE VISCOUS 2 % MT SOLN
OROMUCOSAL | Status: DC | PRN
  Administered 2014-10-02: 3 mL via OROMUCOSAL

## 2014-10-02 MED ORDER — ONDANSETRON HCL 4 MG/2ML IJ SOLN
INTRAMUSCULAR | Status: AC
  Filled 2014-10-02: qty 2

## 2014-10-02 MED ORDER — MEPERIDINE HCL 100 MG/ML IJ SOLN
INTRAMUSCULAR | Status: AC
  Filled 2014-10-02: qty 2

## 2014-10-02 MED ORDER — MIDAZOLAM HCL 5 MG/5ML IJ SOLN
INTRAMUSCULAR | Status: AC
  Filled 2014-10-02: qty 10

## 2014-10-02 MED ORDER — MIDAZOLAM HCL 5 MG/5ML IJ SOLN
INTRAMUSCULAR | Status: DC | PRN
  Administered 2014-10-02 (×3): 1 mg via INTRAVENOUS
  Administered 2014-10-02: 2 mg via INTRAVENOUS

## 2014-10-02 MED ORDER — SIMETHICONE 40 MG/0.6ML PO SUSP
ORAL | Status: DC | PRN
  Administered 2014-10-02: 12:00:00

## 2014-10-02 MED ORDER — ONDANSETRON HCL 4 MG/2ML IJ SOLN
INTRAMUSCULAR | Status: DC | PRN
  Administered 2014-10-02: 4 mg via INTRAVENOUS

## 2014-10-02 MED ORDER — LIDOCAINE VISCOUS 2 % MT SOLN
OROMUCOSAL | Status: AC
  Filled 2014-10-02: qty 15

## 2014-10-02 MED ORDER — MEPERIDINE HCL 100 MG/ML IJ SOLN
INTRAMUSCULAR | Status: DC | PRN
  Administered 2014-10-02 (×3): 25 mg via INTRAVENOUS

## 2014-10-02 NOTE — H&P (View-Only) (Signed)
Referring Provider: No ref. provider found Primary Care Physician:  Rubbie Battiest, MD Primary Gastroenterologist:  Dr.  Luiz Iron for Consultation:  Hematochezia  HPI:  Very pleasant 81 year old lady admitted through the emergency department last evening with multiple episodes of gross bright red blood per rectum. This was associated with no abdominal pain, whatsoever. No recent change in bowel habits as far as straining,  constipation or diarrhea concerned.  Initial hemoglobin 13.1; this morning 11.4;has had no further bleeding. She has remained hemodynamically stable on department 300. Patient endorses one 81 mg aspirin daily. No other NSAIDs. No anticoagulation medication, Plavix, etc. Furthermore, she denies chronic symptoms of odynophagia, dysphagia, early satiety, nausea vomiting or any change in weight.  Last colonoscopy was in 2005-done for screening. She is found to have hemorrhoids and pancolonic diverticulosis. No history of peptic ulcer disease or other GI illness although GERD is listed in her past medical history.. No prior EGD.  No acid suppression therapy at home. No family history of GI neoplasia.    Past Medical History  Diagnosis Date  . Essential hypertension, benign   . Type 2 diabetes mellitus   . GERD (gastroesophageal reflux disease)   . Cataracts, bilateral   . Diverticulitis   . Allergic rhinitis   . Syncope     Neurally mediated  . Coronary atherosclerosis of native coronary artery     DES LAD 3/12, 99% nondominant RCA, No CAD otherwise, LVEF 65%  . Stroke, hemorrhagic     Right thalamic hemorrhage 4/12  . Diabetes mellitus   . Hyperlipidemia   . Stroke   . Diabetic retinopathy     mild    Past Surgical History  Procedure Laterality Date  . Breast cyst excision    . Cardiac catheterization    . Carotid stent  11/16/10    Prior to Admission medications   Medication Sig Start Date End Date Taking? Authorizing Provider  ALPRAZolam Duanne Moron) 0.25 MG  tablet TAKE ONE TABLET DAILY AS NEEDED. 07/20/14  Yes Mikey Kirschner, MD  amLODipine (NORVASC) 2.5 MG tablet TAKE ONE TABLET DAILY. 12/21/13  Yes Satira Sark, MD  aspirin 81 MG tablet Take 81 mg by mouth daily.     Yes Historical Provider, MD  calcium citrate-vitamin D (CITRACAL+D) 315-200 MG-UNIT per tablet Take 1 tablet by mouth every other day.    Yes Historical Provider, MD  CINNAMON PO Take by mouth daily.   Yes Historical Provider, MD  citalopram (CELEXA) 20 MG tablet TAKE 1 AND 1/2 TABLETS BY MOUTH ONCE DAILY. 05/10/14  Yes Mikey Kirschner, MD  COCONUT OIL PO Take by mouth 2 (two) times daily.   Yes Historical Provider, MD  Coenzyme Q10 150 MG CAPS Take 150 mg by mouth every other day.    Yes Historical Provider, MD  fluvastatin (LESCOL) 40 MG capsule TAKE (1) CAPSULE BY MOUTH EVERY DAY FOR INCREASED CHOLESTEROL. 09/19/14  Yes Mikey Kirschner, MD  metoprolol succinate (TOPROL-XL) 25 MG 24 hr tablet TAKE (1) TABLET BY MOUTH AT BEDTIME. 03/24/14  Yes Satira Sark, MD  Multiple Vitamins-Minerals (CENTRUM) tablet Take 1 tablet by mouth daily.    Yes Historical Provider, MD  Omega-3 Fatty Acids (FISH OIL) 1000 MG CAPS Take 1,000 mg by mouth daily.    Yes Historical Provider, MD  Psyllium (METAMUCIL PO) Take by mouth daily.   Yes Historical Provider, MD  ramipril (ALTACE) 5 MG capsule TAKE 1 CAPSULE BY MOUTH TWICE DAILY. 09/19/14  Yes  Mikey Kirschner, MD  TURMERIC PO Take by mouth 2 (two) times daily.   Yes Historical Provider, MD  vitamin B-12 (CYANOCOBALAMIN) 100 MCG tablet Take 100 mcg by mouth daily.   Yes Historical Provider, MD  ACCU-CHEK AVIVA PLUS test strip USE TO TEST BLOOD SUGAR DAILY. 09/19/14   Mikey Kirschner, MD  nitroGLYCERIN (NITROSTAT) 0.4 MG SL tablet Dissolve 1 tablet under tongue every 5 mins up to 15 mins for chest pain. If no relief call 911. 09/23/13   Mikey Kirschner, MD    Current Facility-Administered Medications  Medication Dose Route Frequency Provider  Last Rate Last Dose  . acetaminophen (TYLENOL) tablet 650 mg  650 mg Oral Q6H PRN Radene Gunning, NP       Or  . acetaminophen (TYLENOL) suppository 650 mg  650 mg Rectal Q6H PRN Radene Gunning, NP      . ALPRAZolam Duanne Moron) tablet 0.25 mg  0.25 mg Oral QHS PRN Radene Gunning, NP      . alum & mag hydroxide-simeth (MAALOX/MYLANTA) 200-200-20 MG/5ML suspension 30 mL  30 mL Oral Q6H PRN Radene Gunning, NP      . amLODipine (NORVASC) tablet 2.5 mg  2.5 mg Oral Daily Lezlie Octave Black, NP   2.5 mg at 09/30/14 1424  . atorvastatin (LIPITOR) tablet 10 mg  10 mg Oral q1800 Kathie Dike, MD   10 mg at 09/30/14 1848  . citalopram (CELEXA) tablet 20 mg  20 mg Oral Daily Radene Gunning, NP   20 mg at 09/30/14 1424  . insulin aspart (novoLOG) injection 0-9 Units  0-9 Units Subcutaneous TID WC Radene Gunning, NP   0 Units at 09/30/14 1700  . metoprolol succinate (TOPROL-XL) 24 hr tablet 25 mg  25 mg Oral Daily Radene Gunning, NP   25 mg at 09/30/14 1423  . morphine 2 MG/ML injection 1 mg  1 mg Intravenous Q3H PRN Radene Gunning, NP      . ondansetron [facility]) tablet 4 mg  4 mg Oral Q6H PRN Radene Gunning, NP       Or  . ondansetron [facility]) injection 4 mg  4 mg Intravenous Q6H PRN Radene Gunning, NP      . pantoprazole (PROTONIX) EC tablet 40 mg  40 mg Oral Daily Kathie Dike, MD   40 mg at 09/30/14 1848  . ramipril (ALTACE) capsule 5 mg  5 mg Oral BID Radene Gunning, NP   5 mg at 09/30/14 2233  . traZODone (DESYREL) tablet 25 mg  25 mg Oral QHS PRN Radene Gunning, NP        Allergies as of 09/30/2014 - Review Complete 09/30/2014  Allergen Reaction Noted  . Macrodantin  11/15/2010  . Pravachol [pravastatin sodium]  01/02/2013  . Tetanus toxoids  01/02/2013    Family History  Problem Relation Age of Onset  . Pancreatic cancer Mother     Died at age 11  . Diabetes Mother   . Cancer Mother   . Coronary artery disease Father     Died in his 66s  . Heart attack Father   . Hypertension Father   . Breast cancer  Sister   . Cancer Sister     History   Social History  . Marital Status: Married    Spouse Name: N/A    Number of Children: 2  . Years of Education: N/A   Occupational History  . Retired  Social History Main Topics  . Smoking status: Former Smoker -- 0.25 packs/day    Types: Cigarettes    Quit date: 09/02/1988  . Smokeless tobacco: Never Used  . Alcohol Use: No  . Drug Use: No  . Sexual Activity: No   Other Topics Concern  . Not on file   Social History Narrative    Review of Systems: Gen: Denies any fever, chills, sweats, anorexia, fatigue, weakness, malaise, weight loss, and sleep disorder CV: Denies chest pain, angina, palpitations, syncope, orthopnea, PND, peripheral edema, and claudication. Resp: Denies dyspnea at rest, dyspnea with exercise, cough, sputum, wheezing, coughing up blood, and pleurisy. GI: Denies vomiting blood, jaundice, and fecal incontinence.   Denies dysphagia or odynophagia. Derm: Denies rash, itching, dry skin, hives, moles, warts, or unhealing ulcers.   Physical Exam: Vital signs in last 24 hours: Temp:  [97.7 F (36.5 C)-98.1 F (36.7 C)] 98 F (36.7 C) (01/30 0558) Pulse Rate:  [53-68] 53 (01/30 0558) Resp:  [18-20] 18 (01/30 0558) BP: (122-196)/(45-72) 123/54 mmHg (01/30 0558) SpO2:  [94 %-99 %] 95 % (01/30 0558) Weight:  [150 lb (68.04 kg)] 150 lb (68.04 kg) (01/29 1056) Last BM Date: 09/30/14 General:   Alert,  Well-developed, well-nourished, pleasant and cooperative in NAD. She is accompanied by her husband and son. Head:  Normocephalic and atraumatic. Eyes:  Sclera clear, no icterus.   Conjunctiva pink. Ears:  Normal auditory acuity. Nose:  No deformity, discharge,  or lesions. Mouth:  No deformity or lesions, dentition normal. Neck:  Supple; no masses or thyromegaly. Lungs:  Clear throughout to auscultation.   No wheezes, crackles, or rhonchi. No acute distress. Heart:  Regular rate and rhythm; no murmurs, clicks, rubs,  or  gallops. Abdomen:  Soft, nontender and nondistended. No masses, hepatosplenomegaly or hernias noted. Normal bowel sounds, without guarding, and without rebound.   Rectal:  Deferred until time of colonoscopy.   Msk:  Symmetrical without gross deformities. Normal posture. Pulses:  Normal pulses noted. Extremities:  Without clubbing or edema. Neurologic:  Alert and  oriented x4;  grossly normal neurologically. Intake/Output from previous day: 01/29 0701 - 01/30 0700 In: 600 [P.O.:600] Out: 700 [Urine:700] Intake/Output this shift:    Lab Results:  Recent Labs  09/30/14 1104 09/30/14 1826 10/01/14 0640  WBC 5.9 7.0 5.1  HGB 13.1 12.1 11.4*  HCT 38.7 36.0 34.8*  PLT 260 256 236   BMET  Recent Labs  09/30/14 1104  NA 134*  K 4.2  CL 103  CO2 26  GLUCOSE 127*  BUN 18  CREATININE 0.98  CALCIUM 9.5   LFT  Recent Labs  09/30/14 1104  PROT 7.7  ALBUMIN 4.3  AST 23  ALT 23  ALKPHOS 88  BILITOT 0.5   PT/INR  Recent Labs  09/30/14 1125  LABPROT 13.0  INR 0.97   Impression:  Patient is a pleasant 81 year old lady with multiple episodes of painless hematochezia yesterday necessitating hospitalization. She has had nearly a 2 g drop in hemoglobin.  Some of this decline likely related to hemodilution with IV fluids. She remains hemodynamically stable. History of known hemorrhoids and pancolonic diverticulosis.  I doubt a rapid transit upper GI bleed source of bleeding.  However, this possibility would remain in the differential at this time. It's been 11 years since she had a colonoscopy.   Patient ought to have a GI evaluation. We discussed timing of evaluation including close interval follow-up outpatient evaluation versus proceeding with endoscopic evaluation while she is  here. After some discussion, it was decided that inpatient evaluation would be in her best interest.  Recommendations:  Diagnostic colonoscopy tomorrow. I explained to the patient and family if this  examination does not clearly delineate the cause of  bleeding, I would go ahead and perform an EGD  to complete her inpatient evaluation. Agree with PPI therapy. The risks, benefits, limitations, imponderables and alternatives regarding both EGD and colonoscopy have been reviewed with the patient. Questions have been answered. All parties agreeable.       Notice:  This dictation was prepared with Dragon dictation along with smaller phrase technology. Any transcriptional errors that result from this process are unintentional and may not be corrected upon review.

## 2014-10-02 NOTE — Progress Notes (Signed)
Patient in Endoscopy for colonscopy.

## 2014-10-02 NOTE — Op Note (Signed)
[facility] [address] [place], [address]   COLONOSCOPY PROCEDURE REPORT  PATIENT: [name], [name]  MR#: [id number] BIRTHDATE: 08-May-1934 , 26  yrs. old GENDER: female ENDOSCOPIST: R.  Garfield Cornea, MD FACP [facility] REFERRED YH:CWCBJSE Wolfgang Phoenix, M.D.  Rozann Lesches, M.D. PROCEDURE DATE:  10-28-14 PROCEDURE:   Ileo-colonoscopy, diagnostic INDICATIONS:Hematochezia. MEDICATIONS: Versed 6 mg IV and Demerol 75 mg IV in divided doses. Zofran 4 mg IV. ASA CLASS:       Class II  CONSENT: The risks, benefits, alternatives and imponderables including but not limited to bleeding, perforation as well as the possibility of a missed lesion have been reviewed.  The potential for biopsy, lesion removal, etc. have also been discussed. Questions have been answered.  All parties agreeable.  Please see the history and physical in the medical record for more information.  DESCRIPTION OF PROCEDURE:   After the risks benefits and alternatives of the procedure were thoroughly explained, informed consent was obtained.  The digital rectal exam revealed no abnormalities of the rectum.   The [id number] ([id number])  endoscope was introduced through the anus and advanced to the terminal ileum which was intubated for a short distance. No adverse events experienced.   The quality of the prep was adequate.  The instrument was then slowly withdrawn as the colon was fully examined.      COLON FINDINGS: Somewhat engorged/inflamed internal hemorrhoids; otherwise normal rectum.  Scattered pancolonic reticulosis.  Couple of focal areas of fibrotic/hyperpigmented mucosa surrounding a couple sigmoid diverticula; however, the remainder colonic mucosa appeared normal.  The distal 10 cm of terminal ileal mucosa appeared normal.  Please note there was no blood in the lower GI tract.  Retroflexion was performed. .  Withdrawal time=10 minutes 0 seconds.  The scope was withdrawn and the  procedure completed. COMPLICATIONS: There were no immediate complications.  ENDOSCOPIC IMPRESSION: Somewhat engorged hemorrhoids; Pancolonic diverticulosis.  Subtle mucosal abnormality surrounding a couple sigmoid diverticula?"likely not clinically significant.  Bleeding could have emanated from hemorrhoids with a relatively modest decline in hemoglobin.  RECOMMENDATIONS: See EGD report.   Further recommendations to follow.  eSigned:  R. Garfield Cornea, MD Rosalita Chessman [facility] 10-28-14 12:45 PM   cc:  CPT CODES: ICD CODES:  The ICD and CPT codes recommended by this software are interpretations from the data that the clinical staff has captured with the software.  The verification of the translation of this report to the ICD and CPT codes and modifiers is the sole responsibility of the health care institution and practicing physician where this report was generated.  [place]. will not be held responsible for the validity of the ICD and CPT codes included on this report.  AMA assumes no liability for data contained or not contained herein. CPT is a Designer, television/film set of the [employer].  PATIENT NAME:  [name], [name] MR#: [id number]

## 2014-10-02 NOTE — Progress Notes (Signed)
[facility] PROGRESS NOTE  [name] ENI:[id number] DOB: [birth year] DOA: 09/30/2014 PCP: Rubbie Battiest, MD  Assessment/Plan: 1. GI bleeding. Patient admitted to the hospital with BRBPR. She has undergone Endoscopy and Colonoscopy today with results as below. Bleeding felt to be related to hemorrhoids vs. Possibly diverticular. Continue supportive treatment for now. Will start on anusol cream. Recheck CBC in am 2. Mild acute blood loss anemia. Continue to follow cbc. Does not need transfusion at this time. 3. GERD. Stable, continue PPI, avoid NSAIDs 4. HTN. Stable on current regimen 5. CAD. No active chest pain. Continue current management  Code Status: full code Family Communication: discussed with patient and family  Disposition Plan: discharge home once improved, likely in am   Consultants:  Gastroenterology  Procedures: EGD:2 cm hiatal hernia. Multiple gastric erosions?"likely NSAID effect?"status post biopsy. These lesions would not explain hematochezia.  I suspect anorectal bleeding from hemorrhoids. Less likely  diverticular etiology.  Colonoscopy: Somewhat engorged hemorrhoids; Pancolonic diverticulosis. Subtle mucosal abnormality surrounding a couple sigmoid diverticula?"likely not clinically significant. Bleeding could have emanated from hemorrhoids with a relatively modest decline in hemoglobin.  Antibiotics:    HPI/Subjective: Had some bloody stools with bowel prep. Last two bowel movements were without blood.  Objective: Filed Vitals:   10/02/14 1343  BP: 119/52  Pulse: 55  Temp: 97.4 F (36.3 C)  Resp: 16   No intake or output data in the 24 hours ending 10/02/14 1438 Filed Weights   09/30/14 1056  Weight: 68.04 kg (150 lb)    Exam:   General:  NAD  Cardiovascular: S1, S2 RRR  Respiratory: CTA   Abdomen: soft, nt, nd, bs+  Musculoskeletal: no edema b/l   Data Reviewed: Basic Metabolic Panel:  Recent Labs Lab  09/30/14 1104 10/02/14 0542  NA 134* 141  K 4.2 3.9  CL 103 105  CO2 26 27  GLUCOSE 127* 111*  BUN 18 9  CREATININE 0.98 0.95  CALCIUM 9.5 9.2   Liver Function Tests:  Recent Labs Lab 09/30/14 1104  AST 23  ALT 23  ALKPHOS 88  BILITOT 0.5  PROT 7.7  ALBUMIN 4.3   No results for input(s): LIPASE, AMYLASE in the last 168 hours. No results for input(s): AMMONIA in the last 168 hours. CBC:  Recent Labs Lab 09/30/14 1104 09/30/14 1826 10/01/14 0640 10/02/14 0542  WBC 5.9 7.0 5.1 5.2  NEUTROABS 3.4  --   --   --   HGB 13.1 12.1 11.4* 11.6*  HCT 38.7 36.0 34.8* 34.6*  MCV 91.7 91.6 92.6 92.5  PLT 260 256 236 231   Cardiac Enzymes: No results for input(s): CKTOTAL, CKMB, CKMBINDEX, TROPONINI in the last 168 hours. BNP (last 3 results) No results for input(s): PROBNP in the last 8760 hours. CBG:  Recent Labs Lab 10/01/14 1140 10/01/14 1648 10/01/14 2240 10/02/14 0736 10/02/14 1128  GLUCAP 117* 103* 106* 143* 106*    No results found for this or any previous visit (from the past 240 hour(s)).   Studies: No results found.  Scheduled Meds: . amLODipine  2.5 mg Oral Daily  . atorvastatin  10 mg Oral q1800  . citalopram  20 mg Oral Daily  . insulin aspart  0-9 Units Subcutaneous TID WC  . lidocaine      . meperidine      . metoprolol succinate  25 mg Oral Daily  . midazolam      . ondansetron      . pantoprazole  40 mg  Oral Daily  . ramipril  5 mg Oral BID   Continuous Infusions:   Active Problems:   Mixed hyperlipidemia   Essential hypertension, benign   GERD   Coronary atherosclerosis of native coronary artery   Type 2 diabetes mellitus   Rectal bleed   BRBPR (bright red blood per rectum)   First degree hemorrhoids   Mucosal abnormality of stomach    Time spent: 37mins    Laderrick Wilk  [facility] Pager [phone]. If 7PM-7AM, please contact night-coverage at [email or web address], password [facility] 10/02/2014, 2:38 PM  LOS: 2 days

## 2014-10-02 NOTE — Progress Notes (Signed)
Patient returned from Endo. Alert and oriented. Family at bedside.

## 2014-10-02 NOTE — Interval H&P Note (Signed)
History and Physical Interval Note:  10/02/2014 12:06 PM  [place]  has presented today for surgery, with the diagnosis of Hematochezia  The various methods of treatment have been discussed with the patient and family. After consideration of risks, benefits and other options for treatment, the patient has consented to  Procedure(s): COLONOSCOPY with possible EGD to follow (N/A) as a surgical intervention .  The patient's history has been reviewed, patient examined, no change in status, stable for surgery.  I have reviewed the patient's chart and labs.  Questions were answered to the patient's satisfaction.     Roshelle Traub  Hemoglobin 11. This am.   Ongoing gross blood per rectum with preparation this morning. Colonoscopy with possible EGD to follow-up per plan.  The risks, benefits, limitations, imponderables and alternatives regarding both EGD and colonoscopy have been reviewed with the patient. Questions have been answered. All parties agreeable.

## 2014-10-02 NOTE — Progress Notes (Signed)
UR completed 

## 2014-10-02 NOTE — Op Note (Addendum)
[facility] [address] [place], [address]   ENDOSCOPY PROCEDURE REPORT  PATIENT: [name], [name]  MR#: [id number] BIRTHDATE: [birth year] , 5  yrs. old GENDER: female ENDOSCOPIST: R.  Garfield Cornea, MD FACP FACG REFERRED BY:  Rosemary Holms, M.D.  Rozann Lesches, M.D. PROCEDURE DATE:  10/27/2014 PROCEDURE:  EGD w/ biopsy INDICATIONS:  hematochezia; decline in hemoglobin.  recent ibuprofen use.; Negative colonoscopy. MEDICATIONS: Versed 5 mg IV and Demerol 75 mg IV in divided doses. Zofran 4 mg IV.  Xylocaine gel orally ASA CLASS:      Class II  CONSENT: The risks, benefits, limitations, alternatives and imponderables have been discussed.  The potential for biopsy, esophogeal dilation, etc. have also been reviewed.  Questions have been answered.  All parties agreeable.  Please see the history and physical in the medical record for more information.  DESCRIPTION OF PROCEDURE: After the risks benefits and alternatives of the procedure were thoroughly explained, informed consent was obtained.  The [id number] ([id number]) endoscope was introduced through the mouth and advanced to the second portion of the duodenum , limited by Without limitations. The instrument was slowly withdrawn as the mucosa was fully examined.    Normal esophagus.  Stomach emptying.  2 cm hiatal hernia.  Multiple 1-3 mm punctate appearing gastric erosions.  No ulcer or infiltrating process seen.  Patent pylorus.  Normal-appearing first and second portion of the duodenum.  No blood in the upper GI tract.  The abnormal gastric mucosa was biopsied for histologic study. Retroflexed views revealed as previously described.     The scope was then withdrawn from the patient and the procedure completed.  COMPLICATIONS: There were no immediate complications.  ENDOSCOPIC IMPRESSION: 2 cm hiatal hernia. Multiple gastric erosions?"likely NSAID effect?"status post biopsy. These lesions would  not explain hematochezia.  I suspect anorectal bleeding from hemorrhoids. Less likely diverticular etiology.  RECOMMENDATIONS: Minimize use of nonsteroidal agents. Advance diet. Follow-up on pathology. Home on Benefiber 2 teaspoons twice daily. Anusol cream twice daily.  From a GI standpoint, could probably be discharged within the next 24 hours if she continues to do well.  If any future rectal bleeding, then further evaluation would be warranted.  REPEAT EXAM:  eSigned:  R. Garfield Cornea, MD Rosalita Chessman [facility] 10-27-2014 1:03 PM Revised: 2014-10-27 1:03 PM   CC:  CPT CODES: ICD CODES:  The ICD and CPT codes recommended by this software are interpretations from the data that the clinical staff has captured with the software.  The verification of the translation of this report to the ICD and CPT codes and modifiers is the sole responsibility of the health care institution and practicing physician where this report was generated.  [place]. will not be held responsible for the validity of the ICD and CPT codes included on this report.  AMA assumes no liability for data contained or not contained herein. CPT is a Designer, television/film set of the [employer].  PATIENT NAME:  [name], [name] MR#: [id number]

## 2014-10-03 ENCOUNTER — Encounter (HOSPITAL_COMMUNITY): Payer: Self-pay | Admitting: Internal Medicine

## 2014-10-03 DIAGNOSIS — K921 Melena: Secondary | ICD-10-CM | POA: Diagnosis not present

## 2014-10-03 DIAGNOSIS — D62 Acute posthemorrhagic anemia: Secondary | ICD-10-CM | POA: Insufficient documentation

## 2014-10-03 LAB — CBC
HCT: 30.1 % — ABNORMAL LOW (ref 36.0–46.0)
HCT: 31.7 % — ABNORMAL LOW (ref 36.0–46.0)
HEMOGLOBIN: 10 g/dL — AB (ref 12.0–15.0)
Hemoglobin: 10.7 g/dL — ABNORMAL LOW (ref 12.0–15.0)
MCH: 30.7 pg (ref 26.0–34.0)
MCH: 30.9 pg (ref 26.0–34.0)
MCHC: 33.2 g/dL (ref 30.0–36.0)
MCHC: 33.8 g/dL (ref 30.0–36.0)
MCV: 92.3 fL (ref 78.0–100.0)
MCV: 91.6 fL (ref 78.0–100.0)
PLATELETS: 209 10*3/uL (ref 150–400)
Platelets: 230 10*3/uL (ref 150–400)
RBC: 3.26 MIL/uL — ABNORMAL LOW (ref 3.87–5.11)
RBC: 3.46 MIL/uL — ABNORMAL LOW (ref 3.87–5.11)
RDW: 12.5 % (ref 11.5–15.5)
RDW: 12.5 % (ref 11.5–15.5)
WBC: 5.2 10*3/uL (ref 4.0–10.5)
WBC: 5.7 10*3/uL (ref 4.0–10.5)

## 2014-10-03 LAB — GLUCOSE, CAPILLARY
Glucose-Capillary: 117 mg/dL — ABNORMAL HIGH (ref 70–99)
Glucose-Capillary: 185 mg/dL — ABNORMAL HIGH (ref 70–99)
Glucose-Capillary: 127 mg/dL — ABNORMAL HIGH (ref 70–99)
Glucose-Capillary: 106 mg/dL — ABNORMAL HIGH (ref 70–99)

## 2014-10-03 LAB — HEMOGLOBIN AND HEMATOCRIT, BLOOD
HEMATOCRIT: 31.8 % — AB (ref 36.0–46.0)
HEMOGLOBIN: 10.7 g/dL — AB (ref 12.0–15.0)

## 2014-10-03 MED ORDER — HYDROCORTISONE ACETATE 25 MG RE SUPP
25.0000 mg | Freq: Two times a day (BID) | RECTAL | Status: DC
  Administered 2014-10-03 – 2014-10-04 (×3): 25 mg via RECTAL
  Filled 2014-10-03 (×3): qty 1

## 2014-10-03 NOTE — Progress Notes (Signed)
UR completed 

## 2014-10-03 NOTE — Progress Notes (Signed)
[facility] PROGRESS NOTE  [name] [id number]:[id number] DOB: 02/21/1934 DOA: 09/30/2014 PCP: Rubbie Battiest, MD  Assessment/Plan:  GI bleeding. Has had 2 bloody stools since Endoscopy and Colonoscopy. HG stable.  Bleeding felt to be related to hemorrhoids vs. Possibly diverticular. Continue supportive treatment for now. Will start on anusol cream. Recheck CBC in am  Mild acute blood loss anemia. Continue to follow cbc. Has had 2 episodes BRBPR. Does not need transfusion at this time.  GERD. Stable, continue PPI, avoid NSAIDs  HTN. Stable on current regimen  CAD. No active chest pain. Continue current management   Code Status: stable Family Communication: none present Disposition Plan: home hopefully in am   Consultants:  GI  Procedures: EGD:2 cm hiatal hernia. Multiple gastric erosions?"likely NSAID effect?"status post biopsy. These lesions would not explain hematochezia.  I suspect anorectal bleeding from hemorrhoids. Less likely  diverticular etiology.  Colonoscopy: Somewhat engorged hemorrhoids; Pancolonic diverticulosis. Subtle mucosal abnormality surrounding a couple sigmoid diverticula?"likely not clinically significant. Bleeding could have emanated from  hemorrhoids with a relatively modest decline in hemoglobin.  Antibiotics:  none  HPI/Subjective: Denies pain/discomfort. Reports 2 episodes BRBPR today.   Objective: Filed Vitals:   10/03/14 1449  BP: 147/48  Pulse: 65  Temp: 98.4 F (36.9 C)  Resp: 18    Intake/Output Summary (Last 24 hours) at 10/03/14 1526 Last data filed at 10/03/14 0902  Gross per 24 hour  Intake    480 ml  Output      0 ml  Net    480 ml   Filed Weights   09/30/14 1056  Weight: 68.04 kg (150 lb)    Exam:   General:  Appears comfortable calm  Cardiovascular: RRR no m/g/r  Respiratory: normal effort BS clear  Abdomen: obese +BS non-tender  Musculoskeletal: no clubbing no cyanosis   Data  Reviewed: Basic Metabolic Panel:  Recent Labs Lab 09/30/14 1104 10/02/14 0542  NA 134* 141  K 4.2 3.9  CL 103 105  CO2 26 27  GLUCOSE 127* 111*  BUN 18 9  CREATININE 0.98 0.95  CALCIUM 9.5 9.2   Liver Function Tests:  Recent Labs Lab 09/30/14 1104  AST 23  ALT 23  ALKPHOS 88  BILITOT 0.5  PROT 7.7  ALBUMIN 4.3   No results for input(s): LIPASE, AMYLASE in the last 168 hours. No results for input(s): AMMONIA in the last 168 hours. CBC:  Recent Labs Lab 09/30/14 1104 09/30/14 1826 10/01/14 0640 10/02/14 0542 10/03/14 0621 10/03/14 1221  WBC 5.9 7.0 5.1 5.2 5.2 5.7  NEUTROABS 3.4  --   --   --   --   --   HGB 13.1 12.1 11.4* 11.6* 10.0* 10.7*  HCT 38.7 36.0 34.8* 34.6* 30.1* 31.7*  MCV 91.7 91.6 92.6 92.5 92.3 91.6  PLT 260 256 236 231 209 230   Cardiac Enzymes: No results for input(s): CKTOTAL, CKMB, CKMBINDEX, TROPONINI in the last 168 hours. BNP (last 3 results) No results for input(s): BNP in the last 8760 hours.  ProBNP (last 3 results) No results for input(s): PROBNP in the last 8760 hours.  CBG:  Recent Labs Lab 10/02/14 1128 10/02/14 1654 10/02/14 2221 10/03/14 0747 10/03/14 1130  GLUCAP 106* 90 117* 117* 185*    No results found for this or any previous visit (from the past 240 hour(s)).   Studies: No results found.  Scheduled Meds: . amLODipine  2.5 mg Oral Daily  . atorvastatin  10 mg Oral q1800  .  citalopram  20 mg Oral Daily  . hydrocortisone  25 mg Rectal BID  . insulin aspart  0-9 Units Subcutaneous TID WC  . metoprolol succinate  25 mg Oral Daily  . pantoprazole  40 mg Oral Daily  . ramipril  5 mg Oral BID   Continuous Infusions:   Active Problems:   Mixed hyperlipidemia   Essential hypertension, benign   GERD   Coronary atherosclerosis of native coronary artery   Type 2 diabetes mellitus   Rectal bleed   BRBPR (bright red blood per rectum)   First degree hemorrhoids   Mucosal abnormality of stomach    Time  spent: 35 minutes     [facility] Pager [phone]. If 7PM-7AM, please contact night-coverage at [email or web address], password [email or web address] 10/03/2014, 3:26 PM  LOS: 3 days

## 2014-10-03 NOTE — Progress Notes (Addendum)
At patient's bedside around 1300 to perform rectal exam due to reports of massive amounts of bright red blood per rectum by nurse. External exam with small non-thrombosed external hemorrhoid, internal exam with likely posterior internal hemorrhoid. Gloved finger with scant traces of blood. No active, persistent bleeding noted. H/H ordered and pending.    Called to bedside again around 1350 due to recurrent bright red blood per rectum. However, nursing states this is "much less" than prior episode. I personally saw the bright red blood in toilet, which is difficult to quantify. Appears to be a moderate amount. Hgb returned and was 10.7. Anusol suppositories have been ordered for BID, along with supplemental fiber.   Will recheck H/H today at 1600. Observe for now. May need flex sig if persistent bleeding.   Orvil Feil, ANP-BC [facility]   Attending note:  No further bleeding as of 17:15.  Agree with Anusol and fiber supplementation.  Follow H&H.

## 2014-10-03 NOTE — Progress Notes (Signed)
    Subjective: No abdominal pain, N/V, rectal bleeding. No GI complaints.   Objective: Vital signs in last 24 hours: Temp:  [97.4 F (36.3 C)-98.6 F (37 C)] 98.2 F (36.8 C) (02/01 0810) Pulse Rate:  [54-68] 56 (02/01 0810) Resp:  [13-19] 17 (02/01 0810) BP: (93-171)/(31-64) 141/52 mmHg (02/01 0810) SpO2:  [92 %-100 %] 96 % (02/01 0810) Last BM Date: 10/02/14 General:   Alert and oriented, pleasant Head:  Normocephalic and atraumatic. Eyes:  No icterus, sclera clear. Conjuctiva pink.  Abdomen:  Bowel sounds present, soft, non-tender, non-distended. No HSM or hernias noted. No rebound or guarding. No masses appreciated  Neurologic:  Alert and  oriented x4 Psych:  Alert and cooperative. Normal mood and affect.  Intake/Output from previous day: 01/31 0701 - 02/01 0700 In: 240 [P.O.:240] Out: -  Intake/Output this shift: Total I/O In: 240 [P.O.:240] Out: -   Lab Results:  Recent Labs  10/01/14 0640 10/02/14 0542 10/03/14 0621  WBC 5.1 5.2 5.2  HGB 11.4* 11.6* 10.0*  HCT 34.8* 34.6* 30.1*  PLT 236 231 209   BMET  Recent Labs  09/30/14 1104 10/02/14 0542  NA 134* 141  K 4.2 3.9  CL 103 105  CO2 26 27  GLUCOSE 127* 111*  BUN 18 9  CREATININE 0.98 0.95  CALCIUM 9.5 9.2   LFT  Recent Labs  09/30/14 1104  PROT 7.7  ALBUMIN 4.3  AST 23  ALT 23  ALKPHOS 88  BILITOT 0.5   PT/INR  Recent Labs  09/30/14 1125  LABPROT 13.0  INR 0.97   Assessment: 81 year old female admitted with painless hematochezia and mild anemia, with colonoscopy findings of engorged hemorrhoids, pancolonic diverticulosis, EGD with multiple gastric erosions likely secondary to NSAID effect s/p biopsy. No further overt GI bleeding; although Hgb has drifted since admission, this is likely multifactorial. Anticipate discharge home today.     Plan: Supplemental fiber  Anusol BID Anticipate discharge today   Orvil Feil, ANP-BC [facility]    LOS: 3 days     10/03/2014, 9:11 AM

## 2014-10-04 ENCOUNTER — Encounter: Payer: Self-pay | Admitting: Internal Medicine

## 2014-10-04 ENCOUNTER — Telehealth: Payer: Self-pay | Admitting: Gastroenterology

## 2014-10-04 DIAGNOSIS — K921 Melena: Secondary | ICD-10-CM | POA: Diagnosis not present

## 2014-10-04 LAB — GLUCOSE, CAPILLARY
GLUCOSE-CAPILLARY: 104 mg/dL — AB (ref 70–99)
Glucose-Capillary: 139 mg/dL — ABNORMAL HIGH (ref 70–99)

## 2014-10-04 LAB — CBC
HCT: 30.8 % — ABNORMAL LOW (ref 36.0–46.0)
Hemoglobin: 10.4 g/dL — ABNORMAL LOW (ref 12.0–15.0)
MCH: 30.8 pg (ref 26.0–34.0)
MCHC: 33.8 g/dL (ref 30.0–36.0)
MCV: 91.1 fL (ref 78.0–100.0)
Platelets: 221 10*3/uL (ref 150–400)
RBC: 3.38 MIL/uL — ABNORMAL LOW (ref 3.87–5.11)
RDW: 12.3 % (ref 11.5–15.5)
WBC: 5.5 10*3/uL (ref 4.0–10.5)

## 2014-10-04 MED ORDER — PANTOPRAZOLE SODIUM 40 MG PO TBEC: 40.0000 mg | DELAYED_RELEASE_TABLET | Freq: Every day | ORAL | Status: DC

## 2014-10-04 MED ORDER — CALCIUM POLYCARBOPHIL 625 MG PO TABS: 625.0000 mg | ORAL_TABLET | Freq: Every day | ORAL | Status: DC

## 2014-10-04 MED ORDER — HYDROCORTISONE ACETATE 25 MG RE SUPP: 25.0000 mg | Freq: Two times a day (BID) | RECTAL | Status: DC

## 2014-10-04 NOTE — Progress Notes (Signed)
    Subjective: No further rectal bleeding, no abdominal pain, no N/V. Desires to go home.   Objective: Vital signs in last 24 hours: Temp:  [97.6 F (36.4 C)-98.4 F (36.9 C)] 97.6 F (36.4 C) (02/02 0610) Pulse Rate:  [54-65] 54 (02/02 0610) Resp:  [17-18] 18 (02/02 0610) BP: (141-156)/(48-87) 153/62 mmHg (02/02 0610) SpO2:  [95 %-99 %] 99 % (02/02 0610) Last BM Date: 10/03/14 General:   Alert and oriented, pleasant Head:  Normocephalic and atraumatic. Eyes:  No icterus, sclera clear. Conjuctiva pink.  Abdomen:  Bowel sounds present, soft, non-tender, non-distended. No HSM or hernias noted. No rebound or guarding. No masses appreciated  Neurologic:  Alert and  oriented x4;  grossly normal neurologically. Psych:  Alert and cooperative. Normal mood and affect.  Intake/Output from previous day: 02/01 0701 - 02/02 0700 In: 480 [P.O.:480] Out: 850 [Urine:850] Intake/Output this shift:    Lab Results:  Recent Labs  10/03/14 0621 10/03/14 1221 10/03/14 1537 10/04/14 0643  WBC 5.2 5.7  --  5.5  HGB 10.0* 10.7* 10.7* 10.4*  HCT 30.1* 31.7* 31.8* 30.8*  PLT 209 230  --  221   BMET  Recent Labs  10/02/14 0542  NA 141  K 3.9  CL 105  CO2 27  GLUCOSE 111*  BUN 9  CREATININE 0.95  CALCIUM 9.2    Assessment: 81 year old female admitted with painless hematochezia and mild anemia, with colonoscopy findings of engorged hemorrhoids, pancolonic diverticulosis, EGD with multiple gastric erosions likely secondary to NSAID effect s/p biopsy. Large amount of blood yesterday per rectum but with stable Hgb. Likely secondary to known hemorrhoids. No further overt GI bleeding noted. Appropriate for discharge home today with outpatient follow-up, which we will arrange.    Plan: PPI daily as outpatient Anusol cream BID X 7-10 days Benefiber 2 tsp daily (over the counter) Will arrange outpatient follow-up in our office.   Orvil Feil, ANP-BC [facility]    LOS: 4 days    10/04/2014, 8:07 AM

## 2014-10-04 NOTE — Progress Notes (Signed)
UR completed 

## 2014-10-04 NOTE — Telephone Encounter (Signed)
Please arrange outpatient follow-up with us in 4 weeks  

## 2014-10-04 NOTE — Progress Notes (Signed)
Patient discharged home with instructions given on medications,and follow up visits,patient verbalized understanding. Prescriptions sent with patient, no c/o pain or discomfort noted. Accompanied by staff to an awaiting vehicle.

## 2014-10-04 NOTE — Progress Notes (Signed)
Blood noted in patient's stool this am,bright red,Dr Warnell Forester NP notified, will continue to  Monitor patient. No c/o pain or discomfort noted. Family at bedside.

## 2014-10-04 NOTE — Telephone Encounter (Signed)
APPOINTMENT MADE AND LETTER SENT °

## 2014-10-04 NOTE — Discharge Summary (Signed)
Physician Discharge Summary  [name] SNK:[id number] DOB: Jun 30, 1934 DOA: 09/30/2014  PCP: [name] Battiest, MD  Admit date: 09/30/2014 Discharge date: 10/04/2014  Time spent: 40 minutes  Recommendations for Outpatient Follow-up:  1. Dr Coralee North office will call for follow up appointment   Discharge Diagnoses:  Active Problems:   Mixed hyperlipidemia   Essential hypertension, benign   GERD   Coronary atherosclerosis of native coronary artery   Type 2 diabetes mellitus   Rectal bleed   BRBPR (bright red blood per rectum)   First degree hemorrhoids   Mucosal abnormality of stomach   Acute blood loss anemia   Discharge Condition: stable  Diet recommendation: carb modified  Filed Weights   09/30/14 1056  Weight: 68.04 kg (150 lb)    History of present illness:  [name] is a 81 y.o. female with a past medical history that includes hypertension, CAD, diabetes, diverticulitis presented to the emergency department on 09/30/14 with the chief complaint of bright red blood per rectum.   Patient reported she was in her usual state of health when that morning she had 2 back-to-back episodes of bright red blood per rectum. She stated it was mostly blood with very little loose brown stool. She denied abdominal pain nausea vomiting cramping. Associated symptoms included mild dizziness with position change but denied syncope or near-syncope. Her home medications include 1 baby aspirin a day and she denied any NSAID use. Reported having a colonoscopy approximately 5 years ago which yielded diverticulitis. Reported she was to have follow-up colonoscopy 10 years from that point due 5 years from now. She denied any fever chills dysuria hematuria headache visual disturbances. She denied any chest pain palpitation shortness of breath.  Workup in the emergency department included complete blood count was unremarkable and a hemoglobin of 13.1. Basic metabolic panel was a sodium of 134  serum glucose 127 otherwise unremarkable She was afebrile mildly hypotensive and not hypoxic.  Hospital Course:   GI bleeding. No bloody stools for 24 hours. Hg stable. S/P  Endoscopy and Colonoscopy which indicated hemorrhoids and diverticula in colon. GI recommended anusol cream and adding Benfiber and PPI as well as minimizing NSAID use. GI office will contact patient for follow up.   Mild acute blood loss anemia. Hg remained stable during hospitalization.   GERD. Stable.  HTN. Stable during this hospitalization.  CAD. No active chest pain.     Procedures: EGD:2 cm hiatal hernia. Multiple gastric erosions?"likely NSAID effect?"status post biopsy. These lesions would not explain hematochezia.  I suspect anorectal bleeding from hemorrhoids. Less likely diverticular etiology.  Colonoscopy: Somewhat engorged hemorrhoids; Pancolonic diverticulosis. Subtle mucosal abnormality surrounding a couple sigmoid diverticula?"likely not clinically significant. Bleeding could have emanated from  hemorrhoids with a relatively modest decline in hemoglobin.  Consultations:  GI  Discharge Exam: Filed Vitals:   10/04/14 0610  BP: 153/62  Pulse: 54  Temp: 97.6 F (36.4 C)  Resp: 18    General: well nourished appears comfortable Cardiovascular: RRR No MGR No LE edema Respiratory: normal effort BS clear bilaterally Abd: soft non-distended +BS non-tender  Discharge Instructions    Current Discharge Medication List    START taking these medications   Details  hydrocortisone (ANUSOL-HC) 25 MG suppository Place 1 suppository (25 mg total) rectally 2 (two) times daily. Qty: 12 suppository, Refills: 0    pantoprazole (PROTONIX) 40 MG tablet Take 1 tablet (40 mg total) by mouth daily. Qty: 30 tablet, Refills: 0      CONTINUE  these medications which have NOT CHANGED   Details  ALPRAZolam (XANAX) 0.25 MG tablet TAKE ONE TABLET DAILY AS NEEDED. Qty: 30 tablet, Refills: 4     amLODipine (NORVASC) 2.5 MG tablet TAKE ONE TABLET DAILY. Qty: 90 tablet, Refills: 3    aspirin 81 MG tablet Take 81 mg by mouth daily.      calcium citrate-vitamin D (CITRACAL+D) 315-200 MG-UNIT per tablet Take 1 tablet by mouth every other day.     CINNAMON PO Take by mouth daily.    citalopram (CELEXA) 20 MG tablet TAKE 1 AND 1/2 TABLETS BY MOUTH ONCE DAILY. Qty: 45 tablet, Refills: 5    COCONUT OIL PO Take by mouth 2 (two) times daily.    Coenzyme Q10 150 MG CAPS Take 150 mg by mouth every other day.     fluvastatin (LESCOL) 40 MG capsule TAKE (1) CAPSULE BY MOUTH EVERY DAY FOR INCREASED CHOLESTEROL. Qty: 30 capsule, Refills: 0    metoprolol succinate (TOPROL-XL) 25 MG 24 hr tablet TAKE (1) TABLET BY MOUTH AT BEDTIME. Qty: 30 tablet, Refills: 6    Multiple Vitamins-Minerals (CENTRUM) tablet Take 1 tablet by mouth daily.     Omega-3 Fatty Acids (FISH OIL) 1000 MG CAPS Take 1,000 mg by mouth daily.     Psyllium (METAMUCIL PO) Take by mouth daily.    ramipril (ALTACE) 5 MG capsule TAKE 1 CAPSULE BY MOUTH TWICE DAILY. Qty: 60 capsule, Refills: 0    TURMERIC PO Take by mouth 2 (two) times daily.    vitamin B-12 (CYANOCOBALAMIN) 100 MCG tablet Take 100 mcg by mouth daily.    ACCU-CHEK AVIVA PLUS test strip USE TO TEST BLOOD SUGAR DAILY. Qty: 50 each, Refills: 0    nitroGLYCERIN (NITROSTAT) 0.4 MG SL tablet Dissolve 1 tablet under tongue every 5 mins up to 15 mins for chest pain. If no relief call 911. Qty: 25 tablet, Refills: 0       Allergies  Allergen Reactions  . Macrodantin   . Pravachol [Pravastatin Sodium]   . Tetanus Toxoids       The results of significant diagnostics from this hospitalization (including imaging, microbiology, ancillary and laboratory) are listed below for reference.    Significant Diagnostic Studies: No results found.  Microbiology: No results found for this or any previous visit (from the past 240 hour(s)).   Labs: Basic  Metabolic Panel:  Recent Labs Lab 09/30/14 1104 10/02/14 0542  NA 134* 141  K 4.2 3.9  CL 103 105  CO2 26 27  GLUCOSE 127* 111*  BUN 18 9  CREATININE 0.98 0.95  CALCIUM 9.5 9.2   Liver Function Tests:  Recent Labs Lab 09/30/14 1104  AST 23  ALT 23  ALKPHOS 88  BILITOT 0.5  PROT 7.7  ALBUMIN 4.3   No results for input(s): LIPASE, AMYLASE in the last 168 hours. No results for input(s): AMMONIA in the last 168 hours. CBC:  Recent Labs Lab 09/30/14 1104  10/01/14 0640 10/02/14 0542 10/03/14 0621 10/03/14 1221 10/03/14 1537 10/04/14 0643  WBC 5.9  < > 5.1 5.2 5.2 5.7  --  5.5  NEUTROABS 3.4  --   --   --   --   --   --   --   HGB 13.1  < > 11.4* 11.6* 10.0* 10.7* 10.7* 10.4*  HCT 38.7  < > 34.8* 34.6* 30.1* 31.7* 31.8* 30.8*  MCV 91.7  < > 92.6 92.5 92.3 91.6  --  91.1  PLT 260  < >  236 231 209 230  --  221  < > = values in this interval not displayed. Cardiac Enzymes: No results for input(s): CKTOTAL, CKMB, CKMBINDEX, TROPONINI in the last 168 hours. BNP: BNP (last 3 results) No results for input(s): BNP in the last 8760 hours.  ProBNP (last 3 results) No results for input(s): PROBNP in the last 8760 hours.  CBG:  Recent Labs Lab 10/03/14 0747 10/03/14 1130 10/03/14 1635 10/03/14 2118 10/04/14 0715  GLUCAP 117* 185* 127* 106* 104*       Signed:  Drea Jurewicz M  [facility] 10/04/2014, 9:17 AM

## 2014-10-18 ENCOUNTER — Ambulatory Visit (INDEPENDENT_AMBULATORY_CARE_PROVIDER_SITE_OTHER): Payer: Medicare PPO | Admitting: Family Medicine

## 2014-10-18 ENCOUNTER — Encounter: Payer: Self-pay | Admitting: Family Medicine

## 2014-10-18 VITALS — BP 148/90 | Ht 62.0 in | Wt 154.0 lb

## 2014-10-18 DIAGNOSIS — K625 Hemorrhage of anus and rectum: Secondary | ICD-10-CM | POA: Diagnosis not present

## 2014-10-18 DIAGNOSIS — E785 Hyperlipidemia, unspecified: Secondary | ICD-10-CM | POA: Diagnosis not present

## 2014-10-18 DIAGNOSIS — D62 Acute posthemorrhagic anemia: Secondary | ICD-10-CM | POA: Diagnosis not present

## 2014-10-18 DIAGNOSIS — M129 Arthropathy, unspecified: Secondary | ICD-10-CM | POA: Diagnosis not present

## 2014-10-18 DIAGNOSIS — M1712 Unilateral primary osteoarthritis, left knee: Secondary | ICD-10-CM

## 2014-10-18 NOTE — Patient Instructions (Signed)
Take one iron gluconate tablet daily for about two months  Get the blood work in about three weeks

## 2014-10-18 NOTE — Progress Notes (Signed)
   Subjective:    Patient ID: [name], female    DOB: [birth year], 81 y.o.   MRN: [id number]  HPI Pt is here today for a hospital f/u.  She went to the ED on 01/29 for rectal bleeding. She was dx with bleeding hemorrhoids.   Pt states she has not bled since the weekend, prior to that she was still bleeding with BMs.  Told to take Benefiber BID and has suppositories on hand.   Also states right knee pain, possibly arthritis for 3 months now.   No further blood since   Patient reports no further rectal bleeding. For hospital note is reviewed. Patient had endoscopy. This revealed bleeding hemorrhoids.  Patient had anemia with this. Has not had any follow-up blood work. Also no recent lipid blood work.  Right knee progressively painful patient feels she needs to do something about it.  Review of Systems No headache no chest pain no back pain no abdominal pain no change about some blood in stool    Objective:   Physical Exam  Alert slight malaise HEENT normal. Lungs clear heart rare rhythm abdomen benign right knee somewhat swollen positive crepitations  Procedure note patient was prepped draped anesthetized. Injected with 1 mL Depo-Medrol 2 mL Xylocaine.      Assessment & Plan:  Impression 1 anemia discussed #2 status post hospitalization for hemorrhage discussed #3 hyperlipidemia status uncertain. #4 hypertension good control #5 injection of knee plan appropriate blood work. Diet exercise discussed. Iron supplement discussed. Warning signs discussed. WSL

## 2014-10-24 ENCOUNTER — Other Ambulatory Visit: Payer: Self-pay | Admitting: Family Medicine

## 2014-10-24 ENCOUNTER — Other Ambulatory Visit: Payer: Self-pay | Admitting: Cardiology

## 2014-10-25 ENCOUNTER — Encounter: Payer: Self-pay | Admitting: Internal Medicine

## 2014-10-31 ENCOUNTER — Encounter: Payer: Self-pay | Admitting: Cardiology

## 2014-10-31 ENCOUNTER — Ambulatory Visit (INDEPENDENT_AMBULATORY_CARE_PROVIDER_SITE_OTHER): Payer: Medicare PPO | Admitting: Cardiology

## 2014-10-31 VITALS — BP 132/58 | HR 66 | Ht 63.0 in | Wt 155.0 lb

## 2014-10-31 DIAGNOSIS — E782 Mixed hyperlipidemia: Secondary | ICD-10-CM | POA: Diagnosis not present

## 2014-10-31 DIAGNOSIS — I10 Essential (primary) hypertension: Secondary | ICD-10-CM | POA: Diagnosis not present

## 2014-10-31 DIAGNOSIS — I2510 Atherosclerotic heart disease of native coronary artery without angina pectoris: Secondary | ICD-10-CM

## 2014-10-31 NOTE — Patient Instructions (Signed)
Your physician wants you to follow-up in: 6 months with Dr.McDowell You will receive a reminder letter in the mail two months in advance. If you don't receive a letter, please call our office to schedule the follow-up appointment.     Your physician recommends that you continue on your current medications as directed. Please refer to the Current Medication list given to you today.     Thank you for choosing [facility] !        

## 2014-10-31 NOTE — Progress Notes (Signed)
Cardiology Office Note  Date: 10/31/2014   ID: [name], DOB 03-Nov-1933, MRN [id number]  PCP: Rubbie Battiest, MD  Primary Cardiologist: Rozann Lesches, MD   Chief Complaint  Patient presents with  . Coronary Artery Disease  . Hypertension  . Hyperlipidemia    History of Present Illness: [name] is a 81 y.o. female last seen in August 2015. She is here with her son for a routine visit today. She has been stable from a cardiac perspective. We discussed her recent hospitalization with hematochezia. She did not require packed red cell transfusion, was seen by gastroenterology and underwent EGD and colonoscopy. Bleeding was felt to be due to internal hemorrhoids, recommendation was for Anusol cream, Benefiber, and PPI. She was able to continue on aspirin. She reports no angina during this time.  We did review her cardiac medications outlined below. She continues on Toprol-XL, ramipril, omega-3 supplements, CoQ10, aspirin, and Norvasc. She has not had follow-up stress testing since coronary intervention in 2012.  Follow-up lipid panel is pending for Dr. Wolfgang Phoenix.  Past Medical History  Diagnosis Date  . Essential hypertension, benign   . Type 2 diabetes mellitus   . GERD (gastroesophageal reflux disease)   . Cataracts, bilateral   . Diverticulitis   . Allergic rhinitis   . Syncope     Neurally mediated  . Coronary atherosclerosis of native coronary artery     DES LAD 3/12, 99% nondominant RCA, No CAD otherwise, LVEF 65%  . Stroke, hemorrhagic     Right thalamic hemorrhage 4/12  . Hyperlipidemia   . Diabetic retinopathy     Past Surgical History  Procedure Laterality Date  . Breast cyst excision    . Cardiac catheterization    . Carotid stent  11/16/10  . Colonoscopy N/A 10/02/2014    DJS:HFWYOVZC hemorrhoids/pancolonic diverticulosis  . Esophagogastroduodenoscopy  10/02/14    HYI:FOYDXA esophagus 2 cm HH    Current Outpatient Prescriptions    Medication Sig Dispense Refill  . ACCU-CHEK AVIVA PLUS test strip USE TO TEST BLOOD SUGAR DAILY. 50 each 0  . ALPRAZolam (XANAX) 0.25 MG tablet TAKE ONE TABLET DAILY AS NEEDED. 30 tablet 4  . amLODipine (NORVASC) 2.5 MG tablet TAKE ONE TABLET DAILY. 90 tablet 3  . aspirin 81 MG tablet Take 81 mg by mouth daily.      . calcium citrate-vitamin D (CITRACAL+D) 315-200 MG-UNIT per tablet Take 1 tablet by mouth every other day.     . citalopram (CELEXA) 20 MG tablet TAKE 1 AND 1/2 TABLETS BY MOUTH ONCE DAILY. 45 tablet 5  . Coenzyme Q10 150 MG CAPS Take 150 mg by mouth every other day.     . fluvastatin (LESCOL) 40 MG capsule TAKE (1) CAPSULE BY MOUTH EVERY DAY FOR INCREASED CHOLESTEROL. 30 capsule 0  . metoprolol succinate (TOPROL-XL) 25 MG 24 hr tablet TAKE (1) TABLET BY MOUTH AT BEDTIME. 30 tablet 6  . Multiple Vitamins-Minerals (CENTRUM) tablet Take 1 tablet by mouth daily.     . nitroGLYCERIN (NITROSTAT) 0.4 MG SL tablet Dissolve 1 tablet under tongue every 5 mins up to 15 mins for chest pain. If no relief call 911. 25 tablet 0  . Omega-3 Fatty Acids (FISH OIL) 1000 MG CAPS Take 1,000 mg by mouth daily.     . ramipril (ALTACE) 5 MG capsule TAKE 1 CAPSULE BY MOUTH TWICE DAILY. 60 capsule 1  . TURMERIC PO Take by mouth 2 (two) times daily.    Marland Kitchen  vitamin B-12 (CYANOCOBALAMIN) 100 MCG tablet Take 100 mcg by mouth daily.     No current facility-administered medications for this visit.    Allergies:  Macrodantin; Pravachol; and Tetanus toxoids   Social History: The patient  reports that she quit smoking about 26 years ago. Her smoking use included Cigarettes. She smoked 0.25 packs per day. She has never used smokeless tobacco. She reports that she does not drink alcohol or use illicit drugs.   ROS:  Please see the history of present illness. Otherwise, complete review of systems is positive for generalized fatigue.  All other systems are reviewed and negative.    Physical Exam: VS:  BP 132/58  mmHg  Pulse 66  Ht 5\' 3"  (1.6 m)  Wt 155 lb (70.308 kg)  BMI 27.46 kg/m2, BMI Body mass index is 27.46 kg/(m^2).  Wt Readings from Last 3 Encounters:  10/31/14 155 lb (70.308 kg)  10/18/14 154 lb (69.854 kg)  09/30/14 150 lb (68.04 kg)     Normally nourished appearing elderly woman in no acute distress.  HEENT: Conjunctiva and lids are normal, oropharynx with moist mucosa.  Neck: Supple, no elevated JVP or bruits, or thyromegaly.  Lungs: Clear to auscultation, nonlabored.  Cardiac: Regular rate and rhythm, no S3 or pericardial rub.  Abdomen: Soft, nontender, bowel sounds present.  Skin: Warm and dry.  Musculoskeletal: No kyphosis.  Extremities: No pitting edema.    ECG: ECG is not ordered today.  Recent Labwork: 09/30/2014: ALT 23; AST 23 10/02/2014: BUN 9; Creatinine 0.95; Potassium 3.9; Sodium 141 10/04/2014: Hemoglobin 10.4*; Platelets 221     Component Value Date/Time   CHOL 171 03/02/2014 0813   TRIG 180* 03/02/2014 0813   HDL 54 03/02/2014 0813   CHOLHDL 3.2 03/02/2014 0813   VLDL 36 03/02/2014 0813   LDLCALC 81 03/02/2014 0813    ASSESSMENT AND PLAN:  1. CAD status post DES to the LAD in 2012 with residual 99% nondominant RCA that has been managed medically. She has been quite stable on medical therapy. Last ischemic workup was in 2012, will discuss a repeat test around the time of her next visit. No changes in medical regimen were made today.  2. Essential hypertension. No change in antihypertensive regimen. Blood pressure is reasonable today.  3. Hyperlipidemia, on omega-3 supplements and CoQ10. Lab work pending per Dr. Wolfgang Phoenix.  4. Recent lower GI bleed due to hemorrhoids, records reviewed. She is tolerating aspirin.  Current medicines are reviewed at length with the patient today.  The patient does not have concerns regarding medicines.  Disposition: FU with me in 6 months.   Signed, Satira Sark, MD, [facility] 10/31/2014 2:56 PM    [facility] [address], [place], [place] [address] Phone: [phone]; Fax: [phone]

## 2014-11-02 ENCOUNTER — Encounter: Payer: Self-pay | Admitting: Gastroenterology

## 2014-11-02 ENCOUNTER — Ambulatory Visit (INDEPENDENT_AMBULATORY_CARE_PROVIDER_SITE_OTHER): Payer: Medicare PPO | Admitting: Gastroenterology

## 2014-11-02 VITALS — BP 138/66 | HR 62 | Temp 97.2°F | Ht 62.0 in | Wt 155.0 lb

## 2014-11-02 DIAGNOSIS — K625 Hemorrhage of anus and rectum: Secondary | ICD-10-CM

## 2014-11-02 NOTE — Progress Notes (Signed)
CC'ED TO PCP 

## 2014-11-02 NOTE — Patient Instructions (Signed)
Please call if you have any further rectal bleeding. If you do, we may need to set you up for a hemorrhoid banding procedure in the office.   Continue supplemental fiber daily. I will check on the lab results that you have done next week with Dr. Wolfgang Phoenix to make sure things are going well.   We will see you back as needed!!

## 2014-11-02 NOTE — Assessment & Plan Note (Signed)
81 year old female with recent hospitalization for painless hematochezia secondary to internal hemorrhoids. Modest drop in Hgb likely multifactorial. No further rectal bleeding. Continues with supplemental fiber. Hold off on hemorrhoid banding unless further evidence of bleeding. Dr. Wolfgang Phoenix has ordered repeat CBC to be completed in the next week or so. We will follow up on this as well. Otherwise, return prn.

## 2014-11-02 NOTE — Progress Notes (Signed)
Referring Provider: Mikey Kirschner, MD Primary Care Physician:  Rubbie Battiest, MD  Primary GI: Dr. Gala Romney   Chief Complaint  Patient presents with  . Follow-up    HPI:   [name] is a 81 y.o. female presenting today with a history of rectal bleeding. Inpatient in June 2016 secondary to painless hematochezia. 2 gram drop in Hgb noted at that time. Colonoscopy revealed engorged internal hemorrhoids as likely culprit. EGD with multiple gastric erosions likely secondary to NSAID effect.  Due for blood work for CBC. No rectal bleeding. No constipation or diarrhea. No abdominal pain. No upper GI symptoms. Metamucil daily. Only complaint is of right knee pain.   Past Medical History  Diagnosis Date  . Essential hypertension, benign   . Type 2 diabetes mellitus   . GERD (gastroesophageal reflux disease)   . Cataracts, bilateral   . Diverticulitis   . Allergic rhinitis   . Syncope     Neurally mediated  . Coronary atherosclerosis of native coronary artery     DES LAD 3/12, 99% nondominant RCA, No CAD otherwise, LVEF 65%  . Stroke, hemorrhagic     Right thalamic hemorrhage 4/12  . Hyperlipidemia   . Diabetic retinopathy     Past Surgical History  Procedure Laterality Date  . Breast cyst excision    . Cardiac catheterization    . Carotid stent  11/16/10  . Colonoscopy N/A 10/02/2014    Dr. Rourk:engorged internal hemorrhoids/pancolonic diverticulosis  . Esophagogastroduodenoscopy  10/02/14    Dr. Rourk:normal esophagus 2 cm hiatal hernia, gastric erosions likely from NSAID effect    Current Outpatient Prescriptions  Medication Sig Dispense Refill  . ACCU-CHEK AVIVA PLUS test strip USE TO TEST BLOOD SUGAR DAILY. 50 each 0  . ALPRAZolam (XANAX) 0.25 MG tablet TAKE ONE TABLET DAILY AS NEEDED. 30 tablet 4  . amLODipine (NORVASC) 2.5 MG tablet TAKE ONE TABLET DAILY. 90 tablet 3  . aspirin 81 MG tablet Take 81 mg by mouth daily.      . calcium citrate-vitamin D  (CITRACAL+D) 315-200 MG-UNIT per tablet Take 1 tablet by mouth every other day.     . citalopram (CELEXA) 20 MG tablet TAKE 1 AND 1/2 TABLETS BY MOUTH ONCE DAILY. 45 tablet 5  . Coenzyme Q10 150 MG CAPS Take 150 mg by mouth every other day.     . fluvastatin (LESCOL) 40 MG capsule TAKE (1) CAPSULE BY MOUTH EVERY DAY FOR INCREASED CHOLESTEROL. 30 capsule 0  . metoprolol succinate (TOPROL-XL) 25 MG 24 hr tablet TAKE (1) TABLET BY MOUTH AT BEDTIME. 30 tablet 6  . Multiple Vitamins-Minerals (CENTRUM) tablet Take 1 tablet by mouth daily.     . nitroGLYCERIN (NITROSTAT) 0.4 MG SL tablet Dissolve 1 tablet under tongue every 5 mins up to 15 mins for chest pain. If no relief call 911. 25 tablet 0  . Omega-3 Fatty Acids (FISH OIL) 1000 MG CAPS Take 1,000 mg by mouth daily.     . ramipril (ALTACE) 5 MG capsule TAKE 1 CAPSULE BY MOUTH TWICE DAILY. 60 capsule 1  . TURMERIC PO Take by mouth 2 (two) times daily.    . vitamin B-12 (CYANOCOBALAMIN) 100 MCG tablet Take 100 mcg by mouth daily.     No current facility-administered medications for this visit.    Allergies as of 11/02/2014 - Review Complete 11/02/2014  Allergen Reaction Noted  . Macrodantin  11/15/2010  . Pravachol [pravastatin sodium]  01/02/2013  . Tetanus toxoids  01/02/2013    Family History  Problem Relation Age of Onset  . Pancreatic cancer Mother     Died at age 41  . Diabetes Mother   . Cancer Mother   . Coronary artery disease Father     Died in his 11s  . Heart attack Father   . Hypertension Father   . Breast cancer Sister   . Cancer Sister     History   Social History  . Marital Status: Married    Spouse Name: N/A  . Number of Children: 2  . Years of Education: N/A   Occupational History  . Retired    Social History Main Topics  . Smoking status: Former Smoker -- 0.25 packs/day    Types: Cigarettes    Quit date: 09/02/1988  . Smokeless tobacco: Never Used  . Alcohol Use: No  . Drug Use: No  . Sexual  Activity: No   Other Topics Concern  . None   Social History Narrative    Review of Systems: As mentioned in HPI  Physical Exam: BP 138/66 mmHg  Pulse 62  Temp(Src) 97.2 F (36.2 C)  Ht 5\' 2"  (1.575 m)  Wt 155 lb (70.308 kg)  BMI 28.34 kg/m2 General:   Alert and oriented. No distress noted. Pleasant and cooperative.  Head:  Normocephalic and atraumatic. Eyes:  Conjuctiva clear without scleral icterus. Abdomen:  +BS, soft, non-tender and non-distended. No rebound or guarding. No HSM or masses noted. Msk:  Symmetrical without gross deformities. Normal posture. Extremities:  Without edema. Neurologic:  Alert and  oriented x4;  grossly normal neurologically.thy. Psych:  Alert and cooperative. Normal mood and affect.  Lab Results  Component Value Date   WBC 5.5 10/04/2014   HGB 10.4* 10/04/2014   HCT 30.8* 10/04/2014   MCV 91.1 10/04/2014   PLT 221 10/04/2014

## 2014-11-03 LAB — CBC WITH DIFFERENTIAL/PLATELET
Basophils Absolute: 0 10*3/uL (ref 0.0–0.1)
Basophils Relative: 1 % (ref 0–1)
Eosinophils Absolute: 0.2 10*3/uL (ref 0.0–0.7)
Eosinophils Relative: 5 % (ref 0–5)
HEMATOCRIT: 35.8 % — AB (ref 36.0–46.0)
HEMOGLOBIN: 11.9 g/dL — AB (ref 12.0–15.0)
LYMPHS ABS: 1.6 10*3/uL (ref 0.7–4.0)
Lymphocytes Relative: 38 % (ref 12–46)
MCH: 30.4 pg (ref 26.0–34.0)
MCHC: 33.2 g/dL (ref 30.0–36.0)
MCV: 91.6 fL (ref 78.0–100.0)
MONO ABS: 0.4 10*3/uL (ref 0.1–1.0)
MONOS PCT: 10 % (ref 3–12)
MPV: 9.1 fL (ref 8.6–12.4)
NEUTROS ABS: 2 10*3/uL (ref 1.7–7.7)
Neutrophils Relative %: 46 % (ref 43–77)
Platelets: 299 10*3/uL (ref 150–400)
RBC: 3.91 MIL/uL (ref 3.87–5.11)
RDW: 13.1 % (ref 11.5–15.5)
WBC: 4.3 10*3/uL (ref 4.0–10.5)

## 2014-11-03 LAB — LIPID PANEL
CHOLESTEROL: 177 mg/dL (ref 0–200)
HDL: 63 mg/dL (ref >=46)
LDL Cholesterol: 79 mg/dL (ref 0–99)
TRIGLYCERIDES: 177 mg/dL — AB (ref <150)
Total CHOL/HDL Ratio: 2.8 Ratio
VLDL: 35 mg/dL (ref 0–40)

## 2014-11-07 NOTE — Progress Notes (Signed)
Lab Results  Component Value Date   WBC 4.3 11/03/2014   HGB 11.9* 11/03/2014   HCT 35.8* 11/03/2014   MCV 91.6 11/03/2014   PLT 299 11/03/2014   Hgb improving.

## 2014-11-11 ENCOUNTER — Telehealth: Payer: Self-pay | Admitting: Family Medicine

## 2014-11-11 ENCOUNTER — Encounter: Payer: Self-pay | Admitting: Family Medicine

## 2014-11-11 NOTE — Telephone Encounter (Signed)
Let her know  In mail today, bw looks good more commentary in the letter

## 2014-11-11 NOTE — Telephone Encounter (Signed)
TCNA 

## 2014-11-11 NOTE — Telephone Encounter (Signed)
Patient left me a voicemail stating that normally Dr. Richardson Landry will send her a copy of her blood work results with directions but she has not received this yet.  Can this be sent to her?

## 2014-11-21 ENCOUNTER — Other Ambulatory Visit: Payer: Self-pay | Admitting: Family Medicine

## 2014-11-22 NOTE — Telephone Encounter (Signed)
Patient states that she received results in the mail.

## 2014-12-21 ENCOUNTER — Other Ambulatory Visit: Payer: Self-pay | Admitting: Family Medicine

## 2014-12-27 ENCOUNTER — Other Ambulatory Visit: Payer: Self-pay | Admitting: Cardiology

## 2015-01-02 ENCOUNTER — Ambulatory Visit: Payer: Medicare Other | Admitting: Nurse Practitioner

## 2015-01-10 ENCOUNTER — Ambulatory Visit (INDEPENDENT_AMBULATORY_CARE_PROVIDER_SITE_OTHER): Payer: Medicare PPO | Admitting: Neurology

## 2015-01-10 ENCOUNTER — Encounter: Payer: Self-pay | Admitting: Neurology

## 2015-01-10 VITALS — BP 149/71 | HR 57 | Wt 156.2 lb

## 2015-01-10 DIAGNOSIS — G3184 Mild cognitive impairment, so stated: Secondary | ICD-10-CM

## 2015-01-10 NOTE — Patient Instructions (Signed)
I had a long discussion with the patient and son regarding her remote intracerebral hemorrhage and need for aggressive blood pressure control with goal below 130/90. Her mild cognitive impairment also appears to be stable. Continue fish oil and increase participation in activities which are cognitively challenging like solving crossword puzzles, sudoku. Check follow-up carotid ultrasound study. Return for follow-up in one year or call earlier if necessary. We also discussed briefly memory enhancement strategies Memory Compensation Strategies  1. Use "WARM" strategy.  W= write it down  A= associate it  R= repeat it  M= make a mental note  2.   You can keep a Social worker.  Use a 3-ring notebook with sections for the following: calendar, important names and phone numbers,  medications, doctors' names/phone numbers, lists/reminders, and a section to journal what you did  each day.   3.    Use a calendar to write appointments down.  4.    Write yourself a schedule for the day.  This can be placed on the calendar or in a separate section of the Memory Notebook.  Keeping a  regular schedule can help memory.  5.    Use medication organizer with sections for each day or morning/evening pills.  You may need help loading it  6.    Keep a basket, or pegboard by the door.  Place items that you need to take out with you in the basket or on the pegboard.  You may also want to  include a message board for reminders.  7.    Use sticky notes.  Place sticky notes with reminders in a place where the task is performed.  For example: " turn off the  stove" placed by the stove, "lock the door" placed on the door at eye level, " take your medications" on  the bathroom mirror or by the place where you normally take your medications.  8.    Use alarms/timers.  Use while cooking to remind yourself to check on food or as a reminder to take your medicine, or as a  reminder to make a call, or as a reminder to  perform another task, etc.

## 2015-01-10 NOTE — Progress Notes (Signed)
PATIENT: [name] DOB: Aug 16, 1934  REASON FOR VISIT: routine stroke follow up HISTORY FROM: patient  HISTORY OF PRESENT ILLNESS: 36 year lady with right thalamic hemorrhage in April 2012 with mild memory loss likely from mild cognitive impairment  Update 07/01/13 She returns for f/u after last visit 12/30/11.She is doing well and has not any further stroke or TIA symptoms for over 2 years.She continues to have mild short term memory difficulties which are not specifically progressive and she remains independent in ADls and manages her own affairs. She states her blood pressure is well controlled.She has not had any new health complaints.She states depression is well controlled.   12/30/13 (LL): Patient returns for stroke follow up.  She has been doing well.  Carotid dopplers completed in November 2014 was consistent with stenosis of the right PCA also with presence of acoustic shadow plaque. Acoustic shadow plaque was observed in the right bulb. She states her blood pressure is well controlled at home although is elevated today.  BP in office today is 153/64. She does not feel like her memory is any worse than last visit. She is not had any further stroke or TIA symptoms. She is tolerating daily aspirin well without serious bruising or bleeding. 01/10/2015 : She returns for follow-up after last visit a year ago. She continues to do well without recurrent stroke or TIA symptoms. She states her blood pressure at home is quite well controlled though it is slightly elevated in our office today at 149/71. She continues to have mild short-term memory difficulties but these are unchanged and not progressive. On Mini-Mental status exam today she scored 23/30. She has been taking fish oil but has not been doing cognitively challenging activities. She had carotid ultrasound done in November 2014 which I have personally reviewed and shows no significant extracranial stenosis. REVIEW OF SYSTEMS: Full  14 system review of systems performed and notable only for:  Memory loss, joint pain, skin moles and all other systems negative  ALLERGIES: Allergies  Allergen Reactions  . Macrodantin   . Pravachol [Pravastatin Sodium]   . Tetanus Toxoids     HOME MEDICATIONS: Outpatient Prescriptions Prior to Visit  Medication Sig Dispense Refill  . ACCU-CHEK AVIVA PLUS test strip USE TO TEST BLOOD SUGAR DAILY. 50 each 0  . ALPRAZolam (XANAX) 0.25 MG tablet TAKE ONE TABLET DAILY AS NEEDED. 30 tablet 4  . amLODipine (NORVASC) 2.5 MG tablet TAKE ONE TABLET BY MOUTH ONCE DAILY. 30 tablet 6  . aspirin 81 MG tablet Take 81 mg by mouth daily.      . calcium citrate-vitamin D (CITRACAL+D) 315-200 MG-UNIT per tablet Take 1 tablet by mouth every other day.     . Coenzyme Q10 150 MG CAPS Take 150 mg by mouth every other day.     . fluvastatin (LESCOL) 40 MG capsule TAKE (1) CAPSULE BY MOUTH EVERY DAY FOR INCREASED CHOLESTEROL. 30 capsule 5  . metoprolol succinate (TOPROL-XL) 25 MG 24 hr tablet TAKE (1) TABLET BY MOUTH AT BEDTIME. 30 tablet 6  . Multiple Vitamins-Minerals (CENTRUM) tablet Take 1 tablet by mouth daily.     . nitroGLYCERIN (NITROSTAT) 0.4 MG SL tablet Dissolve 1 tablet under tongue every 5 mins up to 15 mins for chest pain. If no relief call 911. 25 tablet 0  . Omega-3 Fatty Acids (FISH OIL) 1000 MG CAPS Take 1,000 mg by mouth daily.     . ramipril (ALTACE) 5 MG capsule TAKE 1 CAPSULE BY  MOUTH TWICE DAILY. 60 capsule 2  . TURMERIC PO Take by mouth 2 (two) times daily.    . citalopram (CELEXA) 20 MG tablet TAKE 1 AND 1/2 TABLETS BY MOUTH ONCE DAILY. 45 tablet 5  . vitamin B-12 (CYANOCOBALAMIN) 100 MCG tablet Take 100 mcg by mouth daily.     No facility-administered medications prior to visit.     PHYSICAL EXAM  Filed Vitals:   01/10/15 1337  BP: 149/71  Pulse: 57  Weight: 156 lb 3.2 oz (70.852 kg)   Body mass index is 28.56 kg/(m^2).  General: Frail elderly Caucasian lady, seated, in  no evident distress  Head: head normocephalic and atraumatic.    Neck: supple with no carotid or supraclavicular bruits  Cardiovascular: regular rate and rhythm, no murmurs  Musculoskeletal: no deformity  Skin: no rash/petichiae  Vascular: Normal pulses all extremities   Neurologic Exam  Mental Status: Awake and fully alert. Oriented to place and time. Recent and remote memory intact. Attention span, concentration and fund of knowledge appropriate. Mood and affect appropriate. Mini-Mental status exam score 23/30 with deficits in attention calculation and recall. Animal naming test 5 only. Clock drawing 4/4. Geriatric depression scale 2 not depressed.  Cranial Nerves:  Pupils equal, briskly reactive to light. Extraocular movements full without nystagmus. Visual fields full to confrontation. Hearing intact. Facial sensation intact. Face, tongue, palate moves normally and symmetrically.  Motor: Normal bulk and tone. Normal strength in all tested extremity muscles.diminished fine finger movements on left and orbits right over left upper extremity  Sensory: intact to light touch  Coordination: Rapid alternating movements normal in all extremities. Finger-to-nose and heel-to-shin performed accurately bilaterally.  Gait and Station: Arises from chair without difficulty. Stance is normal. Gait demonstrates normal stride length and balance . Able to heel, toe and tandem walk without difficulty.  Reflexes: 1+ and symmetric.   ASSESSMENT AND PLAN 51 year lady with right thalamic hemorrhage in April 2012 with mild memory loss likely from mild cognitive impairment which appears stable.  PLAN:  I had a long discussion with the patient and son regarding her remote intracerebral hemorrhage and need for aggressive blood pressure control with goal below 130/90. Her mild cognitive impairment also appears to be stable. Continue fish oil and increase participation in activities which are cognitively challenging like  solving crossword puzzles, sudoku. Check follow-up carotid ultrasound study. Return for follow-up in one year or call earlier if necessary. We also discussed briefly memory enhancement strategies   Antony Contras, MD  01/10/2015, 2:12 PM [facility] [address], [place], [place] [address] [phone]  Note: This document was prepared with digital dictation and possible smart phrase technology. Any transcriptional errors that result from this process are unintentional.

## 2015-01-24 ENCOUNTER — Other Ambulatory Visit: Payer: Self-pay | Admitting: Family Medicine

## 2015-02-15 ENCOUNTER — Other Ambulatory Visit: Payer: Self-pay | Admitting: Family Medicine

## 2015-02-15 NOTE — Telephone Encounter (Signed)
Ok six mo worth 

## 2015-03-20 ENCOUNTER — Other Ambulatory Visit: Payer: Self-pay | Admitting: Family Medicine

## 2015-03-20 NOTE — Telephone Encounter (Signed)
Needs office visit.

## 2015-03-30 DIAGNOSIS — Z1283 Encounter for screening for malignant neoplasm of skin: Secondary | ICD-10-CM | POA: Diagnosis not present

## 2015-04-19 ENCOUNTER — Other Ambulatory Visit: Payer: Self-pay | Admitting: Cardiology

## 2015-04-20 ENCOUNTER — Encounter: Payer: Self-pay | Admitting: Cardiology

## 2015-04-20 ENCOUNTER — Ambulatory Visit (INDEPENDENT_AMBULATORY_CARE_PROVIDER_SITE_OTHER): Payer: Medicare PPO | Admitting: Cardiology

## 2015-04-20 VITALS — BP 138/70 | HR 62 | Ht 62.0 in | Wt 150.4 lb

## 2015-04-20 DIAGNOSIS — I2510 Atherosclerotic heart disease of native coronary artery without angina pectoris: Secondary | ICD-10-CM

## 2015-04-20 DIAGNOSIS — E782 Mixed hyperlipidemia: Secondary | ICD-10-CM

## 2015-04-20 DIAGNOSIS — I10 Essential (primary) hypertension: Secondary | ICD-10-CM | POA: Diagnosis not present

## 2015-04-20 NOTE — Progress Notes (Signed)
Cardiology Office Note  Date: 04/20/2015   ID: [name], DOB 19-Jun-1934, MRN [id number]  PCP: [name] Hillier, MD  Primary Cardiologist: [name] Lesches, MD   Chief Complaint  Patient presents with  . Coronary Artery Disease    History of Present Illness: [name] is a 81 y.o. female last seen in February. She presents today with her daughter for a follow-up visit. From a cardiac perspective, she denies any significant angina or nitroglycerin use. We reviewed her medications which she is otherwise tolerating. Follow-up ECG today shows sinus bradycardia.  Interval follow-up with Dr. Leonie Proctor with neurology noted back in May. She does have memory loss which was described as stable based on evaluation at that time.  We did discuss her cardiac history from 2012. Also reviewed obtaining a follow-up Lexiscan Cardiolite on medical therapy for ischemic surveillance.  She is due to see Dr. Wolfgang Proctor back for a follow-up medical visit.   Past Medical History  Diagnosis Date  . Essential hypertension, benign   . Type 2 diabetes mellitus   . GERD (gastroesophageal reflux disease)   . Cataracts, bilateral   . Diverticulitis   . Allergic rhinitis   . Syncope     Neurally mediated  . Coronary atherosclerosis of native coronary artery     DES LAD 3/12, 99% nondominant RCA, No CAD otherwise, LVEF 65%  . Stroke, hemorrhagic     Right thalamic hemorrhage 4/12  . Hyperlipidemia   . Diabetic retinopathy   . Hemorrhoids     Past Surgical History  Procedure Laterality Date  . Breast cyst excision    . Cardiac catheterization    . Carotid stent  11/16/10  . Colonoscopy N/A 10/02/2014    Dr. Rourk:engorged internal hemorrhoids/pancolonic diverticulosis  . Esophagogastroduodenoscopy  10/02/14    Dr. Rourk:normal esophagus 2 cm hiatal hernia, gastric erosions likely from NSAID effect    Current Outpatient Prescriptions  Medication Sig Dispense Refill  . ACCU-CHEK AVIVA  PLUS test strip USE TO TEST BLOOD SUGAR DAILY. 50 each 0  . ALPRAZolam (XANAX) 0.25 MG tablet TAKE ONE TABLET DAILY AS NEEDED. 30 tablet 5  . amLODipine (NORVASC) 2.5 MG tablet TAKE ONE TABLET BY MOUTH ONCE DAILY. 30 tablet 6  . aspirin 81 MG tablet Take 81 mg by mouth daily.      . calcium citrate-vitamin D (CITRACAL+D) 315-200 MG-UNIT per tablet Take 1 tablet by mouth every other day.     . citalopram (CELEXA) 20 MG tablet TAKE 1 AND 1/2 TABLETS BY MOUTH ONCE DAILY. (Patient taking differently: Take 1 tablet daily) 45 tablet 11  . Coenzyme Q10 150 MG CAPS Take 150 mg by mouth every other day.     . fluvastatin (LESCOL) 40 MG capsule TAKE (1) CAPSULE BY MOUTH EVERY DAY FOR INCREASED CHOLESTEROL. 30 capsule 5  . metoprolol succinate (TOPROL-XL) 25 MG 24 hr tablet TAKE (1) TABLET BY MOUTH AT BEDTIME. 30 tablet 6  . Multiple Vitamins-Minerals (CENTRUM) tablet Take 1 tablet by mouth daily.     . nitroGLYCERIN (NITROSTAT) 0.4 MG SL tablet Dissolve 1 tablet under tongue every 5 mins up to 15 mins for chest pain. If no relief call 911. 25 tablet 0  . Omega-3 Fatty Acids (FISH OIL) 1000 MG CAPS Take 1,000 mg by mouth daily.     . ramipril (ALTACE) 5 MG capsule TAKE 1 CAPSULE BY MOUTH TWICE DAILY. 60 capsule 0  . TURMERIC PO Take by mouth 2 (two) times daily.  No current facility-administered medications for this visit.    Allergies:  Macrodantin; Pravachol; and Tetanus toxoids   Social History: The patient  reports that she quit smoking about 26 years ago. Her smoking use included Cigarettes. She smoked 0.25 packs per day. She has never used smokeless tobacco. She reports that she does not drink alcohol or use illicit drugs.   ROS:  Please see the history of present illness. Otherwise, complete review of systems is positive for right arm paresthesias intermittently, particularly when she rests on her right arm, better with movement.  All other systems are reviewed and negative.   Physical  Exam: VS:  BP 138/70 mmHg  Pulse 62  Ht 5\' 2"  (1.575 m)  Wt 150 lb 6.4 oz (68.221 kg)  BMI 27.50 kg/m2  SpO2 96%, BMI Body mass index is 27.5 kg/(m^2).  Wt Readings from Last 3 Encounters:  04/20/15 150 lb 6.4 oz (68.221 kg)  01/10/15 156 lb 3.2 oz (70.852 kg)  11/02/14 155 lb (70.308 kg)     Normally nourished appearing elderly woman in no acute distress.  HEENT: Conjunctiva and lids are normal, oropharynx with moist mucosa.  Neck: Supple, no elevated JVP or bruits, or thyromegaly.  Lungs: Clear to auscultation, nonlabored.  Cardiac: Regular rate and rhythm, no S3 or pericardial rub.  Abdomen: Soft, nontender, bowel sounds present.  Skin: Warm and dry.  Musculoskeletal: No kyphosis.  Extremities: No pitting edema.    ECG: ECG is ordered today and shows sinus bradycardia.   Recent Labwork: 09/30/2014: ALT 23; AST 23 10/02/2014: BUN 9; Creatinine, Ser 0.95; Potassium 3.9; Sodium 141 11/03/2014: Hemoglobin 11.9*; Platelets 299     Component Value Date/Time   CHOL 177 11/03/2014 0805   TRIG 177* 11/03/2014 0805   HDL 63 11/03/2014 0805   CHOLHDL 2.8 11/03/2014 0805   VLDL 35 11/03/2014 0805   LDLCALC 79 11/03/2014 0805    Assessment and Plan:  1. CAD status post DES to the LAD in March 2012 with 99% nondominant RCA that has been managed medically. We plan to follow-up with a Lexiscan Cardiolite on medical therapy for assessment of ischemic burden.  2. Essential hypertension, blood pressure is reasonable today.  3. Hyperlipidemia, on fluvastatin with LDL 79 earlier in the year.  Current medicines were reviewed with the patient today.   Orders Placed This Encounter  Procedures  . EKG 12-Lead    Disposition: FU with me in 6 months.   Signed, Satira Sark, MD, [facility] 04/20/2015 11:14 AM    [facility] [place], [place], [place] [address] Phone: [phone]; Fax: [phone]

## 2015-04-20 NOTE — Patient Instructions (Signed)
Your physician wants you to follow-up in: 6 months with Dr.McDowell You will receive a reminder letter in the mail two months in advance. If you don't receive a letter, please call our office to schedule the follow-up appointment.    Your physician recommends that you continue on your current medications as directed. Please refer to the Current Medication list given to you today.   Your physician has requested that you have a lexiscan myoview. For further information please visit [email or web address]. Please follow instruction sheet, as given.        Thank you for choosing [facility] !        

## 2015-04-25 ENCOUNTER — Other Ambulatory Visit: Payer: Self-pay | Admitting: Family Medicine

## 2015-05-16 ENCOUNTER — Inpatient Hospital Stay (HOSPITAL_COMMUNITY): Admission: RE | Admit: 2015-05-16 | Payer: Medicare PPO | Source: Ambulatory Visit

## 2015-05-16 ENCOUNTER — Encounter (HOSPITAL_COMMUNITY)
Admission: RE | Admit: 2015-05-16 | Discharge: 2015-05-16 | Disposition: A | Discharge status: Home / Self Care | Payer: Medicare PPO | Source: Ambulatory Visit | Attending: Cardiology | Admitting: Cardiology

## 2015-05-16 ENCOUNTER — Encounter (HOSPITAL_COMMUNITY): Payer: Self-pay

## 2015-05-16 DIAGNOSIS — I2510 Atherosclerotic heart disease of native coronary artery without angina pectoris: Secondary | ICD-10-CM | POA: Diagnosis not present

## 2015-05-16 LAB — NM MYOCAR MULTI W/SPECT W/WALL MOTION / EF
CHL CUP NUCLEAR SDS: 0
CHL CUP NUCLEAR SSS: 1
LHR: 0.37
LVDIAVOL: 57 mL
LVSYSVOL: 24 mL
Peak HR: 73 {beats}/min
Rest HR: 50 {beats}/min
SRS: 1
TID: 1.11

## 2015-05-16 MED ORDER — REGADENOSON 0.4 MG/5ML IV SOLN
INTRAVENOUS | Status: AC
  Administered 2015-05-16: 0.4 mg via INTRAVENOUS
  Filled 2015-05-16: qty 5

## 2015-05-16 MED ORDER — TECHNETIUM TC 99M SESTAMIBI - CARDIOLITE
30.0000 | Freq: Once | INTRAVENOUS | Status: AC | PRN
  Administered 2015-05-16: 10:00:00 30 via INTRAVENOUS

## 2015-05-16 MED ORDER — TECHNETIUM TC 99M SESTAMIBI GENERIC - CARDIOLITE
10.0000 | Freq: Once | INTRAVENOUS | Status: AC | PRN
  Administered 2015-05-16: 10 via INTRAVENOUS

## 2015-05-16 MED ORDER — SODIUM CHLORIDE 0.9 % IJ SOLN
INTRAMUSCULAR | Status: AC
  Administered 2015-05-16: 10 mL via INTRAVENOUS
  Filled 2015-05-16: qty 3

## 2015-05-25 ENCOUNTER — Other Ambulatory Visit: Payer: Self-pay | Admitting: Family Medicine

## 2015-06-19 ENCOUNTER — Telehealth: Payer: Self-pay | Admitting: Family Medicine

## 2015-06-19 ENCOUNTER — Other Ambulatory Visit: Payer: Self-pay | Admitting: Family Medicine

## 2015-06-19 NOTE — Telephone Encounter (Signed)
Pt is needing refills on metoprolol,citalopram,alprazolam,and fluvastatin to last her to her appt next week.   [employer]

## 2015-06-19 NOTE — Telephone Encounter (Signed)
May have refill on all 1

## 2015-06-20 MED ORDER — ALPRAZOLAM 0.25 MG PO TABS: ORAL_TABLET | ORAL | Status: DC

## 2015-06-20 MED ORDER — CITALOPRAM HYDROBROMIDE 20 MG PO TABS: 20.0000 mg | ORAL_TABLET | Freq: Every day | ORAL | Status: DC

## 2015-06-20 MED ORDER — METOPROLOL SUCCINATE ER 25 MG PO TB24: ORAL_TABLET | ORAL | Status: DC

## 2015-06-20 MED ORDER — FLUVASTATIN SODIUM 40 MG PO CAPS: ORAL_CAPSULE | ORAL | Status: DC

## 2015-06-20 NOTE — Telephone Encounter (Signed)
TCNA (meds already sent into pharmacy)

## 2015-06-21 NOTE — Telephone Encounter (Signed)
Patient notified

## 2015-06-26 ENCOUNTER — Other Ambulatory Visit: Payer: Self-pay | Admitting: Family Medicine

## 2015-06-26 ENCOUNTER — Other Ambulatory Visit: Payer: Self-pay | Admitting: Cardiology

## 2015-06-27 ENCOUNTER — Ambulatory Visit (INDEPENDENT_AMBULATORY_CARE_PROVIDER_SITE_OTHER): Payer: Medicare PPO | Admitting: Family Medicine

## 2015-06-27 ENCOUNTER — Encounter: Payer: Self-pay | Admitting: Family Medicine

## 2015-06-27 VITALS — BP 124/70 | Ht 62.0 in | Wt 153.0 lb

## 2015-06-27 DIAGNOSIS — E119 Type 2 diabetes mellitus without complications: Secondary | ICD-10-CM | POA: Diagnosis not present

## 2015-06-27 DIAGNOSIS — Z23 Encounter for immunization: Secondary | ICD-10-CM | POA: Diagnosis not present

## 2015-06-27 DIAGNOSIS — R5383 Other fatigue: Secondary | ICD-10-CM | POA: Diagnosis not present

## 2015-06-27 DIAGNOSIS — I10 Essential (primary) hypertension: Secondary | ICD-10-CM

## 2015-06-27 DIAGNOSIS — Z13220 Encounter for screening for lipoid disorders: Secondary | ICD-10-CM

## 2015-06-27 LAB — POCT GLYCOSYLATED HEMOGLOBIN (HGB A1C): HEMOGLOBIN A1C: 5.3

## 2015-06-27 MED ORDER — FLUVASTATIN SODIUM 40 MG PO CAPS: ORAL_CAPSULE | ORAL | Status: DC

## 2015-06-27 MED ORDER — RAMIPRIL 5 MG PO CAPS: 5.0000 mg | ORAL_CAPSULE | Freq: Two times a day (BID) | ORAL | Status: DC

## 2015-06-27 MED ORDER — METOPROLOL SUCCINATE ER 25 MG PO TB24: ORAL_TABLET | ORAL | Status: DC

## 2015-06-27 NOTE — Progress Notes (Signed)
   Subjective:    Patient ID: [name], female    DOB: [birth year], 81 y.o.   MRN: [id number]  Diabetes She presents for her follow-up diabetic visit. She has type 2 diabetes mellitus. Her disease course has been stable. There are no hypoglycemic associated symptoms. There are no diabetic associated symptoms. There are no hypoglycemic complications. Symptoms are stable. There are no diabetic complications. There are no known risk factors for coronary artery disease. Current diabetic treatment includes diet. She is compliant with treatment all of the time.   Patient states that she would like to have more energy. She would like some recommendations from the doctor on this.  Patient had some neck pain the past 2 days also. Neck pain is now resolved.  Compliant with blood pressure medicine. Medications reviewed. Blood pressure good when checked elsewhere. Watching salt intake.  Compliant with lipid medicine. No obvious side effects. His cut down fats in diet. Next  No low sugar spells  Last diabetic eye exam- 09/09/14, retinopathy.    Results for orders placed or performed in visit on 06/27/15  POCT glycosylated hemoglobin (Hb A1C)  Result Value Ref Range   Hemoglobin A1C 5.3     Review of Systems No chest pain no headache and back pain no abdominal pain no change in bowel habits no blood in stool ROS otherwise negative    Objective:   Physical Exam Alert vitals stable. H&T normal neck now supple lungs clear. Heart regular in rhythm. Ankles without edema. No focal neurological deficits       Assessment & Plan:  Impression 1 type 2 diabetes control good discussed #2 hypertension controlled good discuss #3 hyperlipidemia control uncertain #4 status post stroke neurologically stable #5 fatigue discussed plan check appropriate blood work. Diet exercise discussed. Medications reviewed and refilled. Recheck in 6 months W[facility] flu shot today

## 2015-06-28 DIAGNOSIS — R5383 Other fatigue: Secondary | ICD-10-CM | POA: Diagnosis not present

## 2015-06-28 DIAGNOSIS — E119 Type 2 diabetes mellitus without complications: Secondary | ICD-10-CM | POA: Diagnosis not present

## 2015-06-28 DIAGNOSIS — Z13220 Encounter for screening for lipoid disorders: Secondary | ICD-10-CM | POA: Diagnosis not present

## 2015-06-29 LAB — HEPATIC FUNCTION PANEL
ALBUMIN: 4.5 g/dL (ref 3.5–4.8)
ALK PHOS: 97 IU/L (ref 39–117)
ALT: 18 IU/L (ref 0–32)
AST: 18 IU/L (ref 0–40)
Bilirubin Total: 0.5 mg/dL (ref 0.0–1.2)
Bilirubin, Direct: 0.16 mg/dL (ref 0.00–0.40)
TOTAL PROTEIN: 6.8 g/dL (ref 6.0–8.5)

## 2015-06-29 LAB — LIPID PANEL
CHOL/HDL RATIO: 3 ratio (ref 0.0–4.4)
CHOLESTEROL TOTAL: 187 mg/dL (ref 100–199)
HDL: 62 mg/dL (ref >39)
LDL CALC: 89 mg/dL (ref 0–99)
Triglycerides: 178 mg/dL — ABNORMAL HIGH (ref 0–149)
VLDL CHOLESTEROL CAL: 36 mg/dL (ref 5–40)

## 2015-06-29 LAB — CBC WITH DIFFERENTIAL/PLATELET
BASOS ABS: 0 10*3/uL (ref 0.0–0.2)
Basos: 1 %
EOS (ABSOLUTE): 0.2 10*3/uL (ref 0.0–0.4)
Eos: 4 %
Hematocrit: 39.8 % (ref 34.0–46.6)
Hemoglobin: 13.1 g/dL (ref 11.1–15.9)
IMMATURE GRANS (ABS): 0 10*3/uL (ref 0.0–0.1)
IMMATURE GRANULOCYTES: 0 %
LYMPHS: 30 %
Lymphocytes Absolute: 1.7 10*3/uL (ref 0.7–3.1)
MCH: 30.5 pg (ref 26.6–33.0)
MCHC: 32.9 g/dL (ref 31.5–35.7)
MCV: 93 fL (ref 79–97)
MONOS ABS: 0.5 10*3/uL (ref 0.1–0.9)
Monocytes: 8 %
NEUTROS PCT: 57 %
Neutrophils Absolute: 3.3 10*3/uL (ref 1.4–7.0)
PLATELETS: 250 10*3/uL (ref 150–379)
RBC: 4.29 x10E6/uL (ref 3.77–5.28)
RDW: 12.9 % (ref 12.3–15.4)
WBC: 5.7 10*3/uL (ref 3.4–10.8)

## 2015-06-29 LAB — BASIC METABOLIC PANEL
BUN/Creatinine Ratio: 13 (ref 11–26)
BUN: 13 mg/dL (ref 8–27)
CALCIUM: 9.8 mg/dL (ref 8.7–10.3)
CO2: 26 mmol/L (ref 18–29)
Chloride: 97 mmol/L (ref 97–106)
Creatinine, Ser: 1 mg/dL (ref 0.57–1.00)
GFR calc Af Amer: 62 mL/min/{1.73_m2} (ref >59)
GFR calc non Af Amer: 54 mL/min/{1.73_m2} — ABNORMAL LOW (ref >59)
GLUCOSE: 109 mg/dL — AB (ref 65–99)
POTASSIUM: 4.5 mmol/L (ref 3.5–5.2)
SODIUM: 139 mmol/L (ref 136–144)

## 2015-06-29 LAB — TSH: TSH: 2.27 u[IU]/mL (ref 0.450–4.500)

## 2015-07-07 ENCOUNTER — Encounter: Payer: Self-pay | Admitting: Family Medicine

## 2015-09-16 ENCOUNTER — Other Ambulatory Visit: Payer: Self-pay | Admitting: Family Medicine

## 2015-09-18 ENCOUNTER — Other Ambulatory Visit: Payer: Self-pay | Admitting: Family Medicine

## 2015-09-20 ENCOUNTER — Telehealth: Payer: Self-pay | Admitting: Family Medicine

## 2015-09-20 NOTE — Telephone Encounter (Signed)
Pt would like a script sent to   For a one touch ultra mini, lancets, one touch ultra blue test   [employer] changed an they were charged for [employer], they were told if a script was sent in that they could be  Reimbursed.

## 2015-09-20 NOTE — Telephone Encounter (Signed)
Rx faxed to pharmacy. Patient notified. 

## 2015-10-24 ENCOUNTER — Other Ambulatory Visit: Payer: Self-pay | Admitting: Family Medicine

## 2015-10-27 ENCOUNTER — Encounter: Payer: Self-pay | Admitting: Cardiology

## 2015-10-27 ENCOUNTER — Ambulatory Visit (INDEPENDENT_AMBULATORY_CARE_PROVIDER_SITE_OTHER): Payer: PPO | Admitting: Cardiology

## 2015-10-27 VITALS — BP 116/68 | HR 67 | Ht 62.0 in | Wt 148.0 lb

## 2015-10-27 DIAGNOSIS — I10 Essential (primary) hypertension: Secondary | ICD-10-CM | POA: Diagnosis not present

## 2015-10-27 DIAGNOSIS — E782 Mixed hyperlipidemia: Secondary | ICD-10-CM | POA: Diagnosis not present

## 2015-10-27 DIAGNOSIS — I2510 Atherosclerotic heart disease of native coronary artery without angina pectoris: Secondary | ICD-10-CM

## 2015-10-27 NOTE — Patient Instructions (Signed)
Your physician wants you to follow-up in: 6 months with Dr McDowell You will receive a reminder letter in the mail two months in advance. If you don't receive a letter, please call our office to schedule the follow-up appointment.     Your physician recommends that you continue on your current medications as directed. Please refer to the Current Medication list given to you today.    If you need a refill on your cardiac medications before your next appointment, please call your pharmacy.     Thank you for choosing [facility] !        

## 2015-10-27 NOTE — Progress Notes (Signed)
Cardiology Office Note  Date: 10/27/2015   ID: [name], DOB 1934-08-07, MRN [id number]  PCP: Mickie Hillier, MD  Primary Cardiologist: Rozann Lesches, MD   Chief Complaint  Patient presents with  . Coronary Artery Disease    History of Present Illness: [name] is an 82 y.o. female last seen in August 2016. She presents for a routine follow-up visit. Overall doing very well, no active angina symptoms on medical therapy, and NYHA class II dyspnea. She still enjoys walking for exercise. She has had some arthritic trouble with her knees.  I reviewed her medications which are outlined below. Cardiac regimen includes aspirin, Norvasc, Toprol-XL, Altace, omega-3 supplements, and as needed nitroglycerin. She had a lipid panel in October 2016 showing LDL 89, triglycerides 178.  We discussed the follow-up Cardiolite from last year which was low risk.  Past Medical History  Diagnosis Date  . Essential hypertension, benign   . Type 2 diabetes mellitus ([place])   . GERD (gastroesophageal reflux disease)   . Cataracts, bilateral   . Diverticulitis   . Allergic rhinitis   . Syncope     Neurally mediated  . Coronary atherosclerosis of native coronary artery     DES LAD 3/12, 99% nondominant RCA, No CAD otherwise, LVEF 65%  . Stroke, hemorrhagic ([place])     Right thalamic hemorrhage 4/12  . Hyperlipidemia   . Diabetic retinopathy ([employer])   . Hemorrhoids     Current Outpatient Prescriptions  Medication Sig Dispense Refill  . ACCU-CHEK AVIVA PLUS test strip USE TO TEST BLOOD SUGAR DAILY. 50 each 0  . ALPRAZolam (XANAX) 0.25 MG tablet TAKE ONE TABLET DAILY AS NEEDED. 30 tablet 0  . amLODipine (NORVASC) 2.5 MG tablet TAKE ONE TABLET BY MOUTH ONCE DAILY. 30 tablet 9  . aspirin 81 MG tablet Take 81 mg by mouth daily.      . calcium citrate-vitamin D (CITRACAL+D) 315-200 MG-UNIT per tablet Take 1 tablet by mouth every other day.     . citalopram (CELEXA) 20 MG tablet TAKE  1 AND 1/2 TABLETS BY MOUTH ONCE DAILY. 45 tablet 0  . Coenzyme Q10 150 MG CAPS Take 150 mg by mouth every other day.     . fluvastatin (LESCOL) 40 MG capsule TAKE (1) CAPSULE BY MOUTH EVERY DAY FOR INCREASED CHOLESTEROL. 30 capsule 5  . metoprolol succinate (TOPROL-XL) 25 MG 24 hr tablet TAKE (1) TABLET BY MOUTH AT BEDTIME. 30 tablet 5  . Multiple Vitamins-Minerals (CENTRUM) tablet Take 1 tablet by mouth daily.     . nitroGLYCERIN (NITROSTAT) 0.4 MG SL tablet Dissolve 1 tablet under tongue every 5 mins up to 15 mins for chest pain. If no relief call 911. 25 tablet 0  . Omega-3 Fatty Acids (FISH OIL) 1000 MG CAPS Take 1,000 mg by mouth daily.     . ramipril (ALTACE) 5 MG capsule Take 1 capsule (5 mg total) by mouth 2 (two) times daily. 60 capsule 5  . TURMERIC PO Take by mouth 2 (two) times daily.     No current facility-administered medications for this visit.   Allergies:  Macrodantin; Pravachol; and Tetanus toxoids   Social History: The patient  reports that she quit smoking about 27 years ago. Her smoking use included Cigarettes. She smoked 0.25 packs per day. She has never used smokeless tobacco. She reports that she does not drink alcohol or use illicit drugs.   ROS:  Please see the history of present illness. Otherwise, complete  review of systems is positive for arthritic pains and musculoskeletal neck discomfort.  All other systems are reviewed and negative.   Physical Exam: VS:  BP 116/68 mmHg  Pulse 67  Ht 5\' 2"  (1.575 m)  Wt 148 lb (67.132 kg)  BMI 27.06 kg/m2  SpO2 96%, BMI Body mass index is 27.06 kg/(m^2).  Wt Readings from Last 3 Encounters:  10/27/15 148 lb (67.132 kg)  06/27/15 153 lb (69.4 kg)  04/20/15 150 lb 6.4 oz (68.221 kg)    Normally nourished appearing elderly woman in no acute distress.  HEENT: Conjunctiva and lids are normal, oropharynx with moist mucosa.  Neck: Supple, no elevated JVP or bruits, or thyromegaly.  Lungs: Clear to auscultation,  nonlabored.  Cardiac: Regular rate and rhythm, no S3 or pericardial rub.  Abdomen: Soft, nontender, bowel sounds present.  Skin: Warm and dry.  Musculoskeletal: No kyphosis.  Extremities: No pitting edema.  ECG: I reviewed the prior tracing from 04/20/2015 which showed sinus bradycardia.  Recent Labwork: 11/03/2014: Hemoglobin 11.9* 06/28/2015: ALT 18; AST 18; BUN 13; Creatinine, Ser 1.00; Platelets 250; Potassium 4.5; Sodium 139; TSH 2.270     Component Value Date/Time   CHOL 187 06/28/2015 0807   CHOL 177 11/03/2014 0805   TRIG 178* 06/28/2015 0807   HDL 62 06/28/2015 0807   HDL 63 11/03/2014 0805   CHOLHDL 3.0 06/28/2015 0807   CHOLHDL 2.8 11/03/2014 0805   VLDL 35 11/03/2014 0805   LDLCALC 89 06/28/2015 0807   LDLCALC 79 11/03/2014 0805    Other Studies Reviewed Today:  Lexiscan Cardiolite 05/16/2015:  There was no ST segment deviation noted during stress.  No significant defects to suggest scar or ischemia.  This is a low risk study.  Nuclear stress EF: 58%.  Assessment and Plan:  1. Symptomatically stable CAD status post DES to the LAD in 2012 with 99% nondominant RCA stenosis that is managed medically. Follow-up Cardiolite study from last year was low risk without active ischemia. Plan to continue medical therapy and observation. Encouraged regular walking regimen.  2. Essential hypertension, blood pressure is well controlled today.  3. Hyperlipidemia with history of statin intolerance. LDL 89 in October 2016.  Current medicines were reviewed with the patient today.  Disposition: FU with me in 6 months.   Signed, Satira Sark, MD, [facility] 10/27/2015 2:40 PM    [facility] at [facility] [address], [place], [place] [address] Phone: [phone]; Fax: [phone]

## 2015-10-31 DIAGNOSIS — E11319 Type 2 diabetes mellitus with unspecified diabetic retinopathy without macular edema: Secondary | ICD-10-CM | POA: Diagnosis not present

## 2015-10-31 DIAGNOSIS — H25813 Combined forms of age-related cataract, bilateral: Secondary | ICD-10-CM | POA: Diagnosis not present

## 2015-10-31 DIAGNOSIS — E113293 Type 2 diabetes mellitus with mild nonproliferative diabetic retinopathy without macular edema, bilateral: Secondary | ICD-10-CM | POA: Diagnosis not present

## 2015-10-31 DIAGNOSIS — E119 Type 2 diabetes mellitus without complications: Secondary | ICD-10-CM | POA: Diagnosis not present

## 2015-12-11 ENCOUNTER — Other Ambulatory Visit: Payer: Self-pay | Admitting: Family Medicine

## 2015-12-14 ENCOUNTER — Other Ambulatory Visit: Payer: Self-pay | Admitting: Family Medicine

## 2015-12-14 NOTE — Telephone Encounter (Signed)
Six mo worth 

## 2015-12-19 ENCOUNTER — Other Ambulatory Visit: Payer: Self-pay | Admitting: Family Medicine

## 2015-12-26 ENCOUNTER — Encounter: Payer: Self-pay | Admitting: Family Medicine

## 2015-12-26 ENCOUNTER — Ambulatory Visit (INDEPENDENT_AMBULATORY_CARE_PROVIDER_SITE_OTHER): Payer: PPO | Admitting: Family Medicine

## 2015-12-26 VITALS — BP 136/64 | Ht 62.0 in | Wt 148.1 lb

## 2015-12-26 DIAGNOSIS — I10 Essential (primary) hypertension: Secondary | ICD-10-CM | POA: Diagnosis not present

## 2015-12-26 DIAGNOSIS — E119 Type 2 diabetes mellitus without complications: Secondary | ICD-10-CM | POA: Diagnosis not present

## 2015-12-26 DIAGNOSIS — I2510 Atherosclerotic heart disease of native coronary artery without angina pectoris: Secondary | ICD-10-CM

## 2015-12-26 DIAGNOSIS — E782 Mixed hyperlipidemia: Secondary | ICD-10-CM | POA: Diagnosis not present

## 2015-12-26 LAB — POCT GLYCOSYLATED HEMOGLOBIN (HGB A1C): Hemoglobin A1C: 5.2

## 2015-12-26 MED ORDER — CITALOPRAM HYDROBROMIDE 20 MG PO TABS: ORAL_TABLET | ORAL | Status: DC

## 2015-12-26 MED ORDER — METOPROLOL SUCCINATE ER 25 MG PO TB24: ORAL_TABLET | ORAL | Status: DC

## 2015-12-26 MED ORDER — FLUVASTATIN SODIUM 40 MG PO CAPS: ORAL_CAPSULE | ORAL | Status: DC

## 2015-12-26 MED ORDER — RAMIPRIL 5 MG PO CAPS: 5.0000 mg | ORAL_CAPSULE | Freq: Two times a day (BID) | ORAL | Status: DC

## 2015-12-26 NOTE — Progress Notes (Signed)
   Subjective:    Patient ID: [name], female    DOB: 1933-12-13, 82 y.o.   MRN: [id number]  Diabetes She presents for her follow-up diabetic visit. She has type 2 diabetes mellitus. MedicAlert identification noted. She does not see a podiatrist.Eye exam is current (Patient states January 2017).   Patient has concerns of arthritis to bilateral knees, and fingers/ geernally takes tylenol, mostly at night, first thing in the morn  Patient states compliant with lipid medicine. Does not miss a dose. Watching fat intake. No obvious side effects. Old blood work reviewed.  Compliant with blood pressure medication. Watching salt intake. Has cut salt down. Does not miss a dose blood pressures generally good when checked elsewhere.  No further episodes of stroke or TIA. Next  Followed by cardiologist 2. With known history of coronary artery disease no current chest pain    Not walking much currently in the rain    Results for orders placed or performed in visit on 12/26/15  POCT HgB A1C  Result Value Ref Range   Hemoglobin A1C 5.2      Review of Systems No headache, no major weight loss or weight gain, no chest pain no back pain abdominal pain no change in bowel habits complete ROS otherwise negative     Objective:   Physical Exam  Alert vitals stable blood pressure excellent on repeat HEENT normal lungs clear heart rare rhythm external canals substantial wax ankles without edema feet pulses good sensation intact      Assessment & Plan:  Impression 1 hypertension good control discussed maintain same dose #2 hyperlipidemia recent good control need for new blood work #3 type 2 diabetes excellent control discussed #4 depression clinically stable compliant with meds does not miss a dose #5 history of stroke clinically stable no new symptoms #6 ordering artery disease no new symptoms plan maintain all medications diet exercise discussed further recommendations based on blood  work recheck in 6 months WSL

## 2015-12-27 DIAGNOSIS — E782 Mixed hyperlipidemia: Secondary | ICD-10-CM | POA: Diagnosis not present

## 2015-12-27 DIAGNOSIS — E119 Type 2 diabetes mellitus without complications: Secondary | ICD-10-CM | POA: Diagnosis not present

## 2015-12-28 LAB — HEPATIC FUNCTION PANEL
ALBUMIN: 4.6 g/dL (ref 3.5–4.7)
ALT: 16 IU/L (ref 0–32)
AST: 16 IU/L (ref 0–40)
Alkaline Phosphatase: 85 IU/L (ref 39–117)
BILIRUBIN TOTAL: 0.5 mg/dL (ref 0.0–1.2)
BILIRUBIN, DIRECT: 0.11 mg/dL (ref 0.00–0.40)
Total Protein: 6.7 g/dL (ref 6.0–8.5)

## 2015-12-28 LAB — LIPID PANEL
CHOLESTEROL TOTAL: 181 mg/dL (ref 100–199)
Chol/HDL Ratio: 3.2 ratio units (ref 0.0–4.4)
HDL: 57 mg/dL (ref >39)
LDL Calculated: 88 mg/dL (ref 0–99)
TRIGLYCERIDES: 180 mg/dL — AB (ref 0–149)
VLDL Cholesterol Cal: 36 mg/dL (ref 5–40)

## 2015-12-28 LAB — MICROALBUMIN / CREATININE URINE RATIO
Creatinine, Urine: 177.1 mg/dL
MICROALB/CREAT RATIO: 5.3 mg/g creat (ref 0.0–30.0)
Microalbumin, Urine: 9.4 ug/mL

## 2016-01-02 ENCOUNTER — Encounter: Payer: Self-pay | Admitting: Family Medicine

## 2016-01-05 ENCOUNTER — Telehealth: Payer: Self-pay | Admitting: *Deleted

## 2016-01-05 NOTE — Telephone Encounter (Signed)
Spoke with patient re: rescheduling her to see NP next week for 6 month FU. She agreed to see Daun Peacock NP, rescheduled for 01/09/16; pt understands to arrive 15 min early.

## 2016-01-09 ENCOUNTER — Ambulatory Visit: Payer: Medicare PPO | Admitting: Neurology

## 2016-01-09 ENCOUNTER — Encounter: Payer: Self-pay | Admitting: Nurse Practitioner

## 2016-01-09 ENCOUNTER — Ambulatory Visit (INDEPENDENT_AMBULATORY_CARE_PROVIDER_SITE_OTHER): Payer: PPO | Admitting: Nurse Practitioner

## 2016-01-09 VITALS — BP 120/76 | HR 51 | Ht 62.0 in | Wt 148.2 lb

## 2016-01-09 DIAGNOSIS — E782 Mixed hyperlipidemia: Secondary | ICD-10-CM | POA: Diagnosis not present

## 2016-01-09 DIAGNOSIS — G3184 Mild cognitive impairment, so stated: Secondary | ICD-10-CM

## 2016-01-09 DIAGNOSIS — Z8673 Personal history of transient ischemic attack (TIA), and cerebral infarction without residual deficits: Secondary | ICD-10-CM | POA: Diagnosis not present

## 2016-01-09 NOTE — Patient Instructions (Addendum)
Stressed the importance of management of risk factors to prevent further stroke Continue aspirin for secondary stroke prevention Maintain strict control of hypertension with blood pressure goal below 130/90, today's reading 120/76 continue antihypertensive medications Cholesterol with LDL cholesterol less than 70, followed by primary care,  most recent 88 continue statin drugs Lescol Exercise by walking, slowly increase , eat healthy diet with whole gr fresh fruits and vegetables Memory score is stable continue exercises for the memory, continued fish oil Follow-up yearly and when necessary

## 2016-01-09 NOTE — Progress Notes (Signed)
[facility]  PATIENT: [name] DOB: [birth year]   REASON FOR VISIT: History of CVA, mild cognitive impairment HISTORY FROM: Patient    HISTORY OF PRESENT ILLNESS: Update 07/01/13 PSShe returns for f/u after last visit 12/30/11.She is doing well and has not any further stroke or TIA symptoms for over 2 years.She continues to have mild short term memory difficulties which are not specifically progressive and she remains independent in ADls and manages her own affairs. She states her blood pressure is well controlled.She has not had any new health complaints.She states depression is well controlled.   12/30/13 (LL): Patient returns for stroke follow up. She has been doing well. Carotid dopplers completed in November 2014 was consistent with stenosis of the right PCA also with presence of acoustic shadow plaque. Acoustic shadow plaque was observed in the right bulb. She states her blood pressure is well controlled at home although is elevated today. BP in office today is 153/64. She does not feel like her memory is any worse than last visit. She is not had any further stroke or TIA symptoms. She is tolerating daily aspirin well without serious bruising or bleeding. 01/10/2015 : She returns for follow-up after last visit a year ago. She continues to do well without recurrent stroke or TIA symptoms. She states her blood pressure at home is quite well controlled though it is slightly elevated in our office today at 149/71. She continues to have mild short-term memory difficulties but these are unchanged and not progressive. On Mini-Mental status exam today she scored 23/30. She has been taking fish oil but has not been doing cognitively challenging activities. She had carotid ultrasound done in November 2014 which I have personally reviewed and shows no significant extracranial stenosis. UPDATE 05/09/2017[name], 82 year old female returns for follow-up. She has a  history of stroke and  mild cognitive impairment. She has not had any stroke or TIA symptoms several years. She is currently on aspirin for secondary stroke prevention. Blood pressure was well-controlled in 120/76 in the office today. Most recent LDL was 88 and she remains on Lescol. Memory score is stable at 25/30 . This has remained stable and has not progressed. She continues to take fish oil and do cognitively challenging activities. Carotid ultrasound June 2016 was negative for  significant stenosis. She returns for reevaluation  REVIEW OF SYSTEMS: Full 14 system review of systems performed and notable only for those listed, all others are neg:  Constitutional: neg  Cardiovascular: neg Ear/Nose/Throat: neg  Skin: neg Eyes: neg Respiratory: neg Gastroitestinal: neg  Hematology/Lymphatic: neg  Endocrine: neg Musculoskeletal:neg Allergy/Immunology: neg Neurological: neg Psychiatric: neg Sleep : neg   ALLERGIES: Allergies  Allergen Reactions  . Macrodantin   . Pravachol [Pravastatin Sodium]   . Tetanus Toxoids     HOME MEDICATIONS: Outpatient Prescriptions Prior to Visit  Medication Sig Dispense Refill  . ACCU-CHEK AVIVA PLUS test strip USE TO TEST BLOOD SUGAR DAILY. 50 each 0  . ALPRAZolam (XANAX) 0.25 MG tablet TAKE ONE TABLET BY MOUTH DAILY AS NEEDED. 30 tablet 5  . amLODipine (NORVASC) 2.5 MG tablet TAKE ONE TABLET BY MOUTH ONCE DAILY. 30 tablet 9  . aspirin 81 MG tablet Take 81 mg by mouth daily.      . calcium citrate-vitamin D (CITRACAL+D) 315-200 MG-UNIT per tablet Take 1 tablet by mouth every other day.     . citalopram (CELEXA) 20 MG tablet TAKE 1 AND 1/2 TABLETS BY MOUTH ONCE DAILY. 45 tablet  5  . Coenzyme Q10 150 MG CAPS Take 150 mg by mouth every other day.     . fluvastatin (LESCOL) 40 MG capsule TAKE (1) CAPSULE BY MOUTH EVERY DAY FOR INCREASED CHOLESTEROL. 30 capsule 5  . metoprolol succinate (TOPROL-XL) 25 MG 24 hr tablet TAKE (1) TABLET BY MOUTH AT BEDTIME. 30  tablet 5  . Multiple Vitamins-Minerals (CENTRUM) tablet Take 1 tablet by mouth daily.     . nitroGLYCERIN (NITROSTAT) 0.4 MG SL tablet Dissolve 1 tablet under tongue every 5 mins up to 15 mins for chest pain. If no relief call 911. 25 tablet 0  . Omega-3 Fatty Acids (FISH OIL) 1000 MG CAPS Take 1,000 mg by mouth daily.     . ramipril (ALTACE) 5 MG capsule Take 1 capsule (5 mg total) by mouth 2 (two) times daily. 60 capsule 5  . TURMERIC PO Take by mouth 2 (two) times daily.     No facility-administered medications prior to visit.    PAST MEDICAL HISTORY: Past Medical History  Diagnosis Date  . Essential hypertension, benign   . Type 2 diabetes mellitus ([place])   . GERD (gastroesophageal reflux disease)   . Cataracts, bilateral   . Diverticulitis   . Allergic rhinitis   . Syncope     Neurally mediated  . Coronary atherosclerosis of native coronary artery     DES LAD 3/12, 99% nondominant RCA, No CAD otherwise, LVEF 65%  . Stroke, hemorrhagic ([place])     Right thalamic hemorrhage 4/12  . Hyperlipidemia   . Diabetic retinopathy ([place])   . Hemorrhoids     PAST SURGICAL HISTORY: Past Surgical History  Procedure Laterality Date  . Breast cyst excision    . Cardiac catheterization    . Carotid stent  11/16/10  . Colonoscopy N/A 10/02/2014    Dr. Rourk:engorged internal hemorrhoids/pancolonic diverticulosis  . Esophagogastroduodenoscopy  10/02/14    Dr. Rourk:normal esophagus 2 cm hiatal hernia, gastric erosions likely from NSAID effect    FAMILY HISTORY: Family History  Problem Relation Age of Onset  . Pancreatic cancer Mother     Died at age 80  . Diabetes Mother   . Cancer Mother   . Coronary artery disease Father     Died in his 51s  . Heart attack Father   . Hypertension Father   . Breast cancer Sister   . Cancer Sister     SOCIAL HISTORY: Social History   Social History  . Marital Status: Married    Spouse Name: N/A  . Number of Children: 2  . Years of Education:  12   Occupational History  . Retired    Social History Main Topics  . Smoking status: Former Smoker -- 0.25 packs/day    Types: Cigarettes    Quit date: 09/02/1988  . Smokeless tobacco: Never Used  . Alcohol Use: No  . Drug Use: No  . Sexual Activity: No   Other Topics Concern  . Not on file   Social History Narrative   Married   Right handed   Caffeine use - coffee x 2 cups     PHYSICAL EXAM  Filed Vitals:   01/09/16 1308  BP: 156/70  Pulse: 51  Height: 5\' 2"  (1.575 m)  Weight: 148 lb 3.2 oz (67.223 kg)   Body mass index is 27.1 kg/(m^2). General: Elderly Caucasian lady, seated, in no evident distress , well-groomed Head: head normocephalic and atraumatic.  Neck: supple with no carotid  bruits  Cardiovascular:  regular rate and rhythm, no murmurs  Musculoskeletal: no deformity  Skin: no rash/petichiae  Vascular: Normal pulses all extremities    Neurological examination   Mentation: Awake and fully alert. Oriented to place and time. Recent and remote memory intact. Attention span, concentration and fund of knowledge appropriate. Mood and affect appropriate. Mini-Mental status exam score 25/30 with deficits in attention calculation and recall. AFT 6. Clock drawing 4/4.   Cranial Nerves: Pupils equal, briskly reactive to light. Extraocular movements full without nystagmus. Visual fields full to confrontation. Hearing intact. Facial sensation intact. Face, tongue, palate moves normally and symmetrically.  Motor: Normal bulk and tone. Normal strength in all tested extremity muscles.diminished fine finger movements on left and orbits right over left upper extremity  Sensory: intact to light touch  Coordination: Rapid alternating movements normal in all extremities. Finger-to-nose and heel-to-shin performed accurately bilaterally.  Gait and Station: Arises from chair without difficulty. Stance is normal. Gait demonstrates normal stride length and balance . Able  to heel, toe and tandem walk without difficulty. No assistive device Reflexes: 1+ and symmetric.  DIAGNOSTIC DATA (LABS, IMAGING, TESTING) - I reviewed patient records, labs, notes, testing and imaging myself where available.  Lab Results  Component Value Date   WBC 5.7 06/28/2015   HGB 11.9* 11/03/2014   HCT 39.8 06/28/2015   MCV 93 06/28/2015   PLT 250 06/28/2015      Component Value Date/Time   NA 139 06/28/2015 0807   NA 141 10/02/2014 0542   K 4.5 06/28/2015 0807   CL 97 06/28/2015 0807   CO2 26 06/28/2015 0807   GLUCOSE 109* 06/28/2015 0807   GLUCOSE 111* 10/02/2014 0542   BUN 13 06/28/2015 0807   BUN 9 10/02/2014 0542   CREATININE 1.00 06/28/2015 0807   CREATININE 0.98 03/02/2014 0813   CALCIUM 9.8 06/28/2015 0807   PROT 6.7 12/27/2015 0854   PROT 7.7 09/30/2014 1104   ALBUMIN 4.6 12/27/2015 0854   ALBUMIN 4.3 09/30/2014 1104   AST 16 12/27/2015 0854   ALT 16 12/27/2015 0854   ALKPHOS 85 12/27/2015 0854   BILITOT 0.5 12/27/2015 0854   BILITOT 0.5 09/30/2014 1104   GFRNONAA 54* 06/28/2015 0807   GFRAA 62 06/28/2015 0807   Lab Results  Component Value Date   CHOL 181 12/27/2015   HDL 57 12/27/2015   LDLCALC 88 12/27/2015   TRIG 180* 12/27/2015   CHOLHDL 3.2 12/27/2015   Lab Results  Component Value Date   HGBA1C 5.2 12/26/2015    Lab Results  Component Value Date   TSH 2.270 06/28/2015      ASSESSMENT AND PLAN  82 y.o. year old female  has a past medical history of Essential hypertension, Type 2 diabetes mellitus ([place]); Stroke, hemorrhagic ([place]); Hyperlipidemia; and mild cognitive impairment here to follow-up. The patient is a current patient of Dr. Leonie Man  who is out of the office today . This note is sent to the work in doctor.   She has not had further stroke or TIA symptoms and her mild cognitive impairment has not progressed. Prior records reviewed  PLAN: Stressed the importance of management of risk factors to prevent further stroke Continue  aspirin for secondary stroke prevention Maintain strict control of hypertension with blood pressure goal below 130/90, today's reading 120/76 continue antihypertensive medications Cholesterol with LDL cholesterol less than 70, followed by primary care,  most recent 88 continue statin drugs Lescol Exercise by walking, slowly increase , eat healthy diet with whole gr  fresh fruits and vegetables Memory score is stable continue exercises for the memory, continued fish oil Follow-up yearly and when necessaryVst time 25 min Dennie Bible, [facility], [facility], APRN  [facility] [address], [place], [place] [address] [phone]

## 2016-01-09 NOTE — Progress Notes (Signed)
I agree with the assessment and plan as directed by NP .The patient is known to me . Memory follow up q 6 month alternating between Np and me.    Aslyn Cottman, MD

## 2016-05-07 NOTE — Progress Notes (Signed)
Cardiology Office Note  Date: 05/08/2016   ID: [name], DOB [birth year], MRN [id number]  PCP: Mickie Hillier, MD  Primary Cardiologist: Rozann Lesches, MD   Chief Complaint  Patient presents with  . Coronary Artery Disease    History of Present Illness: [name] is an 82 y.o. female last seen in February. She presents for a routine follow-up visit. Continues to do well without angina symptoms on medical therapy. Still enjoying walking as exercise. She reports NYHA class II dyspnea.  I reviewed her ECG today which shows sinus bradycardia with nonspecific T-wave abnormalities. Current cardiac regimen includes aspirin, Norvasc, Lescol, Toprol-XL, Altace, and as needed nitroglycerin.  He states that she will be seeing Dr. Wolfgang Phoenix for a physical with lab work soon. Her last LDL was 88 in April.  She is 5 years out from her DES intervention to the LAD. Cardiolite study from last year was low risk in follow-up.  Past Medical History:  Diagnosis Date  . Allergic rhinitis   . Cataracts, bilateral   . Coronary atherosclerosis of native coronary artery    DES LAD 3/12, 99% nondominant RCA, No CAD otherwise, LVEF 65%  . Diabetic retinopathy ([place])   . Diverticulitis   . Essential hypertension, benign   . GERD (gastroesophageal reflux disease)   . Hemorrhoids   . Hyperlipidemia   . Stroke, hemorrhagic ([place])    Right thalamic hemorrhage 4/12  . Syncope    Neurally mediated  . Type 2 diabetes mellitus ([place])     Past Surgical History:  Procedure Laterality Date  . BREAST CYST EXCISION    . CARDIAC CATHETERIZATION    . CAROTID STENT  11/16/10  . COLONOSCOPY N/A 10/02/2014   Dr. Rourk:engorged internal hemorrhoids/pancolonic diverticulosis  . ESOPHAGOGASTRODUODENOSCOPY  10/02/14   Dr. Rourk:normal esophagus 2 cm hiatal hernia, gastric erosions likely from NSAID effect    Current Outpatient Prescriptions  Medication Sig Dispense Refill  . ACCU-CHEK AVIVA PLUS  test strip USE TO TEST BLOOD SUGAR DAILY. 50 each 0  . ALPRAZolam (XANAX) 0.25 MG tablet TAKE ONE TABLET BY MOUTH DAILY AS NEEDED. 30 tablet 5  . amLODipine (NORVASC) 2.5 MG tablet TAKE ONE TABLET BY MOUTH ONCE DAILY. 30 tablet 9  . aspirin 81 MG tablet Take 81 mg by mouth daily.      . calcium citrate-vitamin D (CITRACAL+D) 315-200 MG-UNIT per tablet Take 1 tablet by mouth every other day.     . citalopram (CELEXA) 20 MG tablet TAKE 1 AND 1/2 TABLETS BY MOUTH ONCE DAILY. 45 tablet 5  . Coenzyme Q10 150 MG CAPS Take 150 mg by mouth every other day.     . fluvastatin (LESCOL) 40 MG capsule TAKE (1) CAPSULE BY MOUTH EVERY DAY FOR INCREASED CHOLESTEROL. 30 capsule 5  . metoprolol succinate (TOPROL-XL) 25 MG 24 hr tablet TAKE (1) TABLET BY MOUTH AT BEDTIME. 30 tablet 5  . Multiple Vitamins-Minerals (CENTRUM) tablet Take 1 tablet by mouth daily.     . nitroGLYCERIN (NITROSTAT) 0.4 MG SL tablet Dissolve 1 tablet under tongue every 5 mins up to 15 mins for chest pain. If no relief call 911. 25 tablet 0  . Omega-3 Fatty Acids (FISH OIL) 1000 MG CAPS Take 1,000 mg by mouth daily.     . ramipril (ALTACE) 5 MG capsule Take 1 capsule (5 mg total) by mouth 2 (two) times daily. 60 capsule 5  . TURMERIC PO Take by mouth 2 (two) times daily.  No current facility-administered medications for this visit.    Allergies:  Macrodantin; Pravachol [pravastatin sodium]; and Tetanus toxoids   Social History: The patient  reports that she quit smoking about 27 years ago. Her smoking use included Cigarettes. She smoked 0.25 packs per day. She has never used smokeless tobacco. She reports that she does not drink alcohol or use drugs.   ROS:  Please see the history of present illness. Otherwise, complete review of systems is positive for none.  All other systems are reviewed and negative.   Physical Exam: VS:  BP (!) 150/66   Pulse 65   Ht 5\' 1"  (1.549 m)   Wt 149 lb (67.6 kg)   SpO2 98%   BMI 28.15 kg/m , BMI  Body mass index is 28.15 kg/m.  Wt Readings from Last 3 Encounters:  05/08/16 149 lb (67.6 kg)  01/09/16 148 lb 3.2 oz (67.2 kg)  12/26/15 148 lb 2 oz (67.2 kg)    Normally nourished appearing elderly woman in no acute distress.  HEENT: Conjunctiva and lids are normal, oropharynx with moist mucosa.  Neck: Supple, no elevated JVP or bruits, or thyromegaly.  Lungs: Clear to auscultation, nonlabored.  Cardiac: Regular rate and rhythm, no S3 or pericardial rub.  Abdomen: Soft, nontender, bowel sounds present.  Skin: Warm and dry.  Musculoskeletal: No kyphosis.  Extremities: No pitting edema. Neuropsychiatric: Alert and oriented 3, affect appropriate.  ECG: I personally reviewed the tracing from 04/20/2015 which showed sinus bradycardia.  Recent Labwork: 06/28/2015: BUN 13; Creatinine, Ser 1.00; Platelets 250; Potassium 4.5; Sodium 139; TSH 2.270 12/27/2015: ALT 16; AST 16     Component Value Date/Time   CHOL 181 12/27/2015 0854   TRIG 180 (H) 12/27/2015 0854   HDL 57 12/27/2015 0854   CHOLHDL 3.2 12/27/2015 0854   CHOLHDL 2.8 11/03/2014 0805   VLDL 35 11/03/2014 0805   LDLCALC 88 12/27/2015 0854    Other Studies Reviewed Today:  Lexiscan Cardiolite 05/16/2015:  There was no ST segment deviation noted during stress.  No significant defects to suggest scar or ischemia.  This is a low risk study.  Nuclear stress EF: 58%.  Assessment and Plan:  1. CAD status post DES to the LAD in 2012 with residual nondominant RCA disease and normal LVEF. She is doing well without angina on medical therapy and had a low risk follow-up Cardiolite study last year. We will continue observation.  2. Hyperlipidemia, on Lescol and omega-3 supplements. She follows with Dr. Wolfgang Phoenix all will having lab work soon.  3. Essential hypertension, blood pressure is elevated somewhat today. She reports compliance with her medications. If trend continues may need further adjustments, possibly Norvasc  or lisinopril. She is bradycardic so would not push Toprol-XL further.  4. Previous history of hemorrhagic stroke right thalamus in 2012. She has been clinically stable.  Current medicines were reviewed with the patient today.   Orders Placed This Encounter  Procedures  . EKG 12-Lead    Disposition: Follow-up with me in 6 months.  Signed, Satira Sark, MD, [facility] 05/08/2016 9:35 AM    [facility] at [facility] [address], [place], [place] [address] Phone: [phone]; Fax: [phone]

## 2016-05-08 ENCOUNTER — Ambulatory Visit (INDEPENDENT_AMBULATORY_CARE_PROVIDER_SITE_OTHER): Payer: PPO | Admitting: Cardiology

## 2016-05-08 ENCOUNTER — Encounter: Payer: Self-pay | Admitting: Cardiology

## 2016-05-08 VITALS — BP 150/66 | HR 65 | Ht 61.0 in | Wt 149.0 lb

## 2016-05-08 DIAGNOSIS — I2510 Atherosclerotic heart disease of native coronary artery without angina pectoris: Secondary | ICD-10-CM | POA: Diagnosis not present

## 2016-05-08 DIAGNOSIS — Z8673 Personal history of transient ischemic attack (TIA), and cerebral infarction without residual deficits: Secondary | ICD-10-CM

## 2016-05-08 DIAGNOSIS — I10 Essential (primary) hypertension: Secondary | ICD-10-CM

## 2016-05-08 DIAGNOSIS — E782 Mixed hyperlipidemia: Secondary | ICD-10-CM

## 2016-05-08 NOTE — Patient Instructions (Signed)
Your physician wants you to follow-up in: 6 Months with Dr. McDowell.  You will receive a reminder letter in the mail two months in advance. If you don't receive a letter, please call our office to schedule the follow-up appointment.  Your physician recommends that you continue on your current medications as directed. Please refer to the Current Medication list given to you today.  If you need a refill on your cardiac medications before your next appointment, please call your pharmacy.  Thank you for choosing [facility]! '  

## 2016-05-17 ENCOUNTER — Other Ambulatory Visit: Payer: Self-pay | Admitting: Cardiology

## 2016-06-10 ENCOUNTER — Other Ambulatory Visit: Payer: Self-pay | Admitting: Family Medicine

## 2016-06-17 ENCOUNTER — Other Ambulatory Visit: Payer: Self-pay | Admitting: Family Medicine

## 2016-06-17 NOTE — Telephone Encounter (Signed)
osk six mo worth if time

## 2016-06-20 ENCOUNTER — Other Ambulatory Visit: Payer: Self-pay | Admitting: Family Medicine

## 2016-06-20 NOTE — Telephone Encounter (Signed)
Ok six mo worth 

## 2016-06-26 ENCOUNTER — Ambulatory Visit (INDEPENDENT_AMBULATORY_CARE_PROVIDER_SITE_OTHER): Payer: PPO | Admitting: Family Medicine

## 2016-06-26 ENCOUNTER — Encounter: Payer: Self-pay | Admitting: Family Medicine

## 2016-06-26 VITALS — BP 128/76 | Ht 61.0 in | Wt 149.0 lb

## 2016-06-26 DIAGNOSIS — Z9181 History of falling: Secondary | ICD-10-CM

## 2016-06-26 DIAGNOSIS — I10 Essential (primary) hypertension: Secondary | ICD-10-CM | POA: Diagnosis not present

## 2016-06-26 DIAGNOSIS — Z23 Encounter for immunization: Secondary | ICD-10-CM

## 2016-06-26 DIAGNOSIS — E785 Hyperlipidemia, unspecified: Secondary | ICD-10-CM | POA: Diagnosis not present

## 2016-06-26 DIAGNOSIS — Z79899 Other long term (current) drug therapy: Secondary | ICD-10-CM

## 2016-06-26 DIAGNOSIS — E119 Type 2 diabetes mellitus without complications: Secondary | ICD-10-CM

## 2016-06-26 DIAGNOSIS — E782 Mixed hyperlipidemia: Secondary | ICD-10-CM | POA: Diagnosis not present

## 2016-06-26 LAB — POCT GLYCOSYLATED HEMOGLOBIN (HGB A1C): HEMOGLOBIN A1C: 5.5

## 2016-06-26 MED ORDER — METOPROLOL SUCCINATE ER 25 MG PO TB24: ORAL_TABLET | ORAL | 5 refills | Status: DC

## 2016-06-26 MED ORDER — RAMIPRIL 5 MG PO CAPS: 5.0000 mg | ORAL_CAPSULE | Freq: Two times a day (BID) | ORAL | 5 refills | Status: DC

## 2016-06-26 MED ORDER — CITALOPRAM HYDROBROMIDE 20 MG PO TABS: ORAL_TABLET | ORAL | 5 refills | Status: DC

## 2016-06-26 MED ORDER — FLUVASTATIN SODIUM 40 MG PO CAPS: ORAL_CAPSULE | ORAL | 5 refills | Status: DC

## 2016-06-26 NOTE — Progress Notes (Signed)
   Subjective:    Patient ID: [name], female    DOB: 03-Jan-1934, 82 y.o.   MRN: [id number] Patient arrives office for an extremely protracted visit. She brings a long list of concerns. As does her son who brings a separate list of concerns Diabetes  She presents for her follow-up diabetic visit. She has type 2 diabetes mellitus. She is following a generally healthy diet. She does not see a podiatrist.Eye exam is current.  A1C today 5.5 patient claims sugars generally running good. Watching her diet in this regard. No sugars 90-100.  Rash on right leg. No pain or itching. Came up 2 -3 months ago. Recalls no exposures or injuries.  Not exercising as much doing some lifting and   Fell twice in the last couple of months One time standing in a chair trimming bushes during the summer  The other time she was out walking fast and got dizzy. Happened a couple of weeks ago. At times becomes unsteady is concerned about it. No loss of consciousness. She is followed by the neurologist. Their note last spring is reviewed today in presence of patient. This did reveal some ongoing residual weakness in the face of a stroke.  Blood pressure medicine and blood pressure levels reviewed today with patient. Compliant with blood pressure medicine. States does not miss a dose. No obvious side effects. Blood pressure generally good when checked elsewhere. Watching salt intake.   Patient continues to take lipid medication regularly. No obvious side effects from it. Generally does not miss a dose. Prior blood work results are reviewed with patient. Patient continues to work on fat intake in diet    Flu vaccine.   Results for orders placed or performed in visit on 06/26/16  POCT glycosylated hemoglobin (Hb A1C)  Result Value Ref Range   Hemoglobin A1C 5.5       Review of Systems No headache, no major weight loss or weight gain, no chest pain no back pain abdominal pain no change in bowel habits  complete ROS otherwise negative     Objective:   Physical Exam Alert pleasant no acute distress. Vitals stable blood pressure good on repeat. HEENT normal lungs clear no wheezes or crackles heart regular in rhythm no cerebellar findings no obvious focal neurological deficits. Patient able to stand from chair without assistance. Able to walk back and down the hallway without difficulty. Rash nonspecific minimal rash on right thigh      Assessment & Plan:  Impression 1 hypertension good control discussed maintain same #2 unsteadiness with a couple falls. Probable residual from stroke along with deconditioning. Discussed at length. Family does not want physical therapy or neurology referral this time #3 hyperlipidemia meds reviewed prior results reviewed #4 depression clinically stable to maintain same medication #5 insomnia discussed along with general concerns #6 hypertension discuss good control to maintain same #7 type 2 diabetes excellent control to maintain same #8 numerous questions about supplements answered plan flu shot today. Appropriate blood work. Maintain all medications. Diet exercise reviewed. Recheck in 6 months. A full 40 minutes spent in the room with this patient

## 2016-06-27 DIAGNOSIS — Z79899 Other long term (current) drug therapy: Secondary | ICD-10-CM | POA: Diagnosis not present

## 2016-06-27 DIAGNOSIS — E119 Type 2 diabetes mellitus without complications: Secondary | ICD-10-CM | POA: Diagnosis not present

## 2016-06-27 DIAGNOSIS — E785 Hyperlipidemia, unspecified: Secondary | ICD-10-CM | POA: Diagnosis not present

## 2016-06-28 LAB — BASIC METABOLIC PANEL
BUN / CREAT RATIO: 13 (ref 12–28)
BUN: 14 mg/dL (ref 8–27)
CHLORIDE: 99 mmol/L (ref 96–106)
CO2: 26 mmol/L (ref 18–29)
Calcium: 9.7 mg/dL (ref 8.7–10.3)
Creatinine, Ser: 1.05 mg/dL — ABNORMAL HIGH (ref 0.57–1.00)
GFR calc non Af Amer: 50 mL/min/{1.73_m2} — ABNORMAL LOW (ref >59)
GFR, EST AFRICAN AMERICAN: 58 mL/min/{1.73_m2} — AB (ref >59)
Glucose: 113 mg/dL — ABNORMAL HIGH (ref 65–99)
POTASSIUM: 4.8 mmol/L (ref 3.5–5.2)
SODIUM: 141 mmol/L (ref 134–144)

## 2016-06-28 LAB — LIPID PANEL
CHOLESTEROL TOTAL: 187 mg/dL (ref 100–199)
Chol/HDL Ratio: 3.5 ratio units (ref 0.0–4.4)
HDL: 54 mg/dL (ref >39)
LDL Calculated: 95 mg/dL (ref 0–99)
TRIGLYCERIDES: 191 mg/dL — AB (ref 0–149)
VLDL Cholesterol Cal: 38 mg/dL (ref 5–40)

## 2016-06-28 LAB — MICROALBUMIN / CREATININE URINE RATIO
Creatinine, Urine: 87.3 mg/dL
Microalb/Creat Ratio: 5.7 mg/g creat (ref 0.0–30.0)
Microalbumin, Urine: 5 ug/mL

## 2016-06-28 LAB — HEPATIC FUNCTION PANEL
ALT: 13 IU/L (ref 0–32)
AST: 12 IU/L (ref 0–40)
Albumin: 4.6 g/dL (ref 3.5–4.7)
Alkaline Phosphatase: 79 IU/L (ref 39–117)
BILIRUBIN TOTAL: 0.5 mg/dL (ref 0.0–1.2)
BILIRUBIN, DIRECT: 0.14 mg/dL (ref 0.00–0.40)
Total Protein: 7.2 g/dL (ref 6.0–8.5)

## 2016-07-03 ENCOUNTER — Encounter: Payer: Self-pay | Admitting: Family Medicine

## 2016-07-04 ENCOUNTER — Telehealth: Payer: Self-pay | Admitting: Family Medicine

## 2016-07-04 NOTE — Telephone Encounter (Signed)
Patient is requesting results of labs and also a copy of them be mailed to her.

## 2016-07-04 NOTE — Telephone Encounter (Signed)
Discussed with pt.letter was written yesterday. Pt should get a copy in the mail.

## 2016-07-22 ENCOUNTER — Other Ambulatory Visit: Payer: Self-pay | Admitting: Family Medicine

## 2016-11-22 NOTE — Progress Notes (Signed)
Cardiology Office Note  Date: 11/25/2016   ID: JAVONA BERGEVIN, DOB 12-Jan-1934, MRN [id number]  PCP: Mickie Hillier, MD  Primary Cardiologist: Rozann Lesches, MD   Chief Complaint  Patient presents with  . Coronary Artery Disease    History of Present Illness: [name] is an 83 y.o. female last seen in September 2017. She presents for a routine follow-up visit. Admits that she has not been as active over the winter months, but would like to get back to her outdoor walking when the weather improves. She does not report any angina symptoms and continues on medical therapy.  She continues to follow with Dr. Wolfgang Phoenix. I reviewed her most recent lab work.  Current cardiac regimen includes aspirin, Norvasc, Toprol-XL, Lescol, Altace, and as needed nitroglycerin.  She has undergone stress testing within the last 2 years as outlined below.  Past Medical History:  Diagnosis Date  . Allergic rhinitis   . Cataracts, bilateral   . Coronary atherosclerosis of native coronary artery    DES LAD 3/12, 99% nondominant RCA, No CAD otherwise, LVEF 65%  . Diabetic retinopathy ([place])   . Diverticulitis   . Essential hypertension, benign   . GERD (gastroesophageal reflux disease)   . Hemorrhoids   . Hyperlipidemia   . Stroke, hemorrhagic ([place])    Right thalamic hemorrhage 4/12  . Syncope    Neurally mediated  . Type 2 diabetes mellitus ([place])     Past Surgical History:  Procedure Laterality Date  . BREAST CYST EXCISION    . CARDIAC CATHETERIZATION    . CAROTID STENT  11/16/10  . COLONOSCOPY N/A 10/02/2014   Dr. Rourk:engorged internal hemorrhoids/pancolonic diverticulosis  . ESOPHAGOGASTRODUODENOSCOPY  10/02/14   Dr. Rourk:normal esophagus 2 cm hiatal hernia, gastric erosions likely from NSAID effect    Current Outpatient Prescriptions  Medication Sig Dispense Refill  . ALPRAZolam (XANAX) 0.25 MG tablet TAKE ONE TABLET BY MOUTH DAILY AS NEEDED. 30 tablet 5  . amLODipine  (NORVASC) 2.5 MG tablet TAKE ONE TABLET BY MOUTH ONCE DAILY. 30 tablet 6  . aspirin 81 MG tablet Take 81 mg by mouth daily.      . calcium citrate-vitamin D (CITRACAL+D) 315-200 MG-UNIT per tablet Take 1 tablet by mouth every other day.     . citalopram (CELEXA) 20 MG tablet TAKE 1 AND 1/2 TABLETS BY MOUTH ONCE DAILY. 45 tablet 5  . Coenzyme Q10 150 MG CAPS Take 150 mg by mouth every other day.     . fluvastatin (LESCOL) 40 MG capsule TAKE (1) CAPSULE BY MOUTH EVERY DAY FOR INCREASED CHOLESTEROL. 30 capsule 5  . metoprolol succinate (TOPROL-XL) 25 MG 24 hr tablet TAKE (1) TABLET BY MOUTH AT BEDTIME. 30 tablet 5  . Multiple Vitamins-Minerals (CENTRUM) tablet Take 1 tablet by mouth daily.     . nitroGLYCERIN (NITROSTAT) 0.4 MG SL tablet Dissolve 1 tablet under tongue every 5 mins up to 15 mins for chest pain. If no relief call 911. 25 tablet 0  . Omega-3 Fatty Acids (FISH OIL) 1000 MG CAPS Take 1,000 mg by mouth daily.     . ONE TOUCH ULTRA TEST test strip USE AS DIRECTED ONCE DAILY. 50 each 5  . ramipril (ALTACE) 5 MG capsule Take 1 capsule (5 mg total) by mouth 2 (two) times daily. 60 capsule 5  . TURMERIC PO Take by mouth 2 (two) times daily.     No current facility-administered medications for this visit.    Allergies:  Macrodantin; Pravachol [pravastatin sodium]; and Tetanus toxoids   Social History: The patient  reports that she quit smoking about 28 years ago. Her smoking use included Cigarettes. She smoked 0.25 packs per day. She has never used smokeless tobacco. She reports that she does not drink alcohol or use drugs.   ROS:  Please see the history of present illness. Otherwise, complete review of systems is positive for none.  All other systems are reviewed and negative.   Physical Exam: VS:  BP 132/70   Pulse 66   Ht 5\' 2"  (1.575 m)   Wt 146 lb (66.2 kg)   SpO2 97%   BMI 26.70 kg/m , BMI Body mass index is 26.7 kg/m.  Wt Readings from Last 3 Encounters:  11/25/16 146 lb  (66.2 kg)  06/26/16 149 lb (67.6 kg)  05/08/16 149 lb (67.6 kg)    Normally nourished appearing elderly woman in no acute distress.  HEENT: Conjunctiva and lids are normal, oropharynx with moist mucosa.  Neck: Supple, no elevated JVP or bruits, or thyromegaly.  Lungs: Clear to auscultation, nonlabored.  Cardiac: Regular rate and rhythm, no S3 or pericardial rub.  Abdomen: Soft, nontender, bowel sounds present.  Skin: Warm and dry.  Musculoskeletal: No kyphosis.  Extremities: No pitting edema. Neuropsychiatric: Alert and oriented 3, affect appropriate.  ECG: I personally reviewed the tracing from 02/09/2016 which showed sinus bradycardia with nonspecific T-wave changes.  Recent Labwork: 06/27/2016: ALT 13; AST 12; BUN 14; Creatinine, Ser 1.05; Potassium 4.8; Sodium 141     Component Value Date/Time   CHOL 187 06/27/2016 0856   TRIG 191 (H) 06/27/2016 0856   HDL 54 06/27/2016 0856   CHOLHDL 3.5 06/27/2016 0856   CHOLHDL 2.8 11/03/2014 0805   VLDL 35 11/03/2014 0805   LDLCALC 95 06/27/2016 0856    Other Studies Reviewed Today:  Lexiscan Cardiolite 05/16/2015:  There was no ST segment deviation noted during stress.  No significant defects to suggest scar or ischemia.  This is a low risk study.  Nuclear stress EF: 58%.  Assessment and Plan:  1. Symptomatically stable CAD status post DES to the LAD in 2012 with nondominant RCA disease that is being managed medically. She had a low risk Myoview within the last 2 years, we will plan to continue with observation for now. I encouraged her to get back to her regular walking regimen.  2. Essential hypertension, blood pressure is adequate control today. No changes made to current regimen.  3. Hyperlipidemia, continues on Lescol. Last LDL 95.  4. History of hemorrhagic right thalamic stroke in 2012. She has had no subsequent residua or new symptoms. She has been able to stay on aspirin.  Current medicines were reviewed  with the patient today.  Disposition: Follow-up in 6 months.  Signed, Satira Sark, MD, [facility] 11/25/2016 12:53 PM    [facility] [address], [place],  [address] Phone: [phone]; Fax: [phone]

## 2016-11-25 ENCOUNTER — Encounter: Payer: Self-pay | Admitting: Cardiology

## 2016-11-25 ENCOUNTER — Ambulatory Visit (INDEPENDENT_AMBULATORY_CARE_PROVIDER_SITE_OTHER): Payer: PPO | Admitting: Cardiology

## 2016-11-25 ENCOUNTER — Other Ambulatory Visit: Payer: Self-pay | Admitting: Family Medicine

## 2016-11-25 VITALS — BP 132/70 | HR 66 | Ht 62.0 in | Wt 146.0 lb

## 2016-11-25 DIAGNOSIS — I2510 Atherosclerotic heart disease of native coronary artery without angina pectoris: Secondary | ICD-10-CM | POA: Diagnosis not present

## 2016-11-25 DIAGNOSIS — Z8673 Personal history of transient ischemic attack (TIA), and cerebral infarction without residual deficits: Secondary | ICD-10-CM

## 2016-11-25 DIAGNOSIS — I10 Essential (primary) hypertension: Secondary | ICD-10-CM

## 2016-11-25 DIAGNOSIS — E782 Mixed hyperlipidemia: Secondary | ICD-10-CM

## 2016-11-25 NOTE — Patient Instructions (Signed)
Your physician wants you to follow-up in: 6 months with Dr McDowell You will receive a reminder letter in the mail two months in advance. If you don't receive a letter, please call our office to schedule the follow-up appointment.     Your physician recommends that you continue on your current medications as directed. Please refer to the Current Medication list given to you today.    If you need a refill on your cardiac medications before your next appointment, please call your pharmacy.     Thank you for choosing [facility] !        

## 2016-12-16 ENCOUNTER — Other Ambulatory Visit: Payer: Self-pay | Admitting: Family Medicine

## 2016-12-25 ENCOUNTER — Encounter: Payer: Self-pay | Admitting: Family Medicine

## 2016-12-25 ENCOUNTER — Ambulatory Visit (INDEPENDENT_AMBULATORY_CARE_PROVIDER_SITE_OTHER): Payer: PPO | Admitting: Family Medicine

## 2016-12-25 VITALS — BP 120/64 | Ht 61.0 in | Wt 145.4 lb

## 2016-12-25 DIAGNOSIS — E785 Hyperlipidemia, unspecified: Secondary | ICD-10-CM

## 2016-12-25 DIAGNOSIS — E119 Type 2 diabetes mellitus without complications: Secondary | ICD-10-CM | POA: Diagnosis not present

## 2016-12-25 DIAGNOSIS — H6123 Impacted cerumen, bilateral: Secondary | ICD-10-CM

## 2016-12-25 DIAGNOSIS — Z79899 Other long term (current) drug therapy: Secondary | ICD-10-CM | POA: Diagnosis not present

## 2016-12-25 DIAGNOSIS — I10 Essential (primary) hypertension: Secondary | ICD-10-CM | POA: Diagnosis not present

## 2016-12-25 LAB — POCT GLYCOSYLATED HEMOGLOBIN (HGB A1C): Hemoglobin A1C: 5.3

## 2016-12-25 MED ORDER — FLUVASTATIN SODIUM 40 MG PO CAPS: ORAL_CAPSULE | ORAL | 5 refills | Status: DC

## 2016-12-25 MED ORDER — CITALOPRAM HYDROBROMIDE 20 MG PO TABS: ORAL_TABLET | ORAL | 5 refills | Status: DC

## 2016-12-25 MED ORDER — METOPROLOL SUCCINATE ER 25 MG PO TB24: ORAL_TABLET | ORAL | 5 refills | Status: DC

## 2016-12-25 MED ORDER — RAMIPRIL 5 MG PO CAPS: 5.0000 mg | ORAL_CAPSULE | Freq: Two times a day (BID) | ORAL | 5 refills | Status: DC

## 2016-12-25 NOTE — Progress Notes (Signed)
   Subjective:    Patient ID: [name], female    DOB: Jan 07, 1934, 83 y.o.   MRN: [id number]  Diabetes  She presents for her follow-up diabetic visit. She has type 2 diabetes mellitus. She does not see a podiatrist.Eye exam is not current (Upcoming appointment).   Results for orders placed or performed in visit on 12/25/16  POCT HgB A1C  Result Value Ref Range   Hemoglobin A1C 5.3    Patient claims compliance with diabetes medication. No obvious side effects. Reports no substantial low sugar spells. Most numbers are generally in good range when checked fasting. Generally does not miss a dose of medication. Watching diabetic diet closely  Blood pressure medicine and blood pressure levels reviewed today with patient. Compliant with blood pressure medicine. States does not miss a dose. No obvious side effects. Blood pressure generally good when checked elsewhere. Watching salt intake.   Patient continues to take lipid medication regularly. No obvious side effects from it. Generally does not miss a dose. Prior blood work results are reviewed with patient. Patient continues to work on fat intake in diet  Patient notes ongoing compliance with antidepressant medication. No obvious side effects. Reports does not miss a dose. Overall continues to help depression substantially. No thoughts of homicide or suicide. Would like to maintain medication.   Patient has concerns of ear congestion. , feel stopped up Review of Systems No headache, no major weight loss or weight gain, no chest pain no back pain abdominal pain no change in bowel habits complete ROS otherwise negative     Objective:   Physical Exam Alert and oriented, vitals reviewed and stable, NAD ENT-TM's and ext canals WNL bilat via otoscopic exam Soft palate, tonsils and post pharynx WNL via oropharyngeal exam Neck-symmetric, no masses; thyroid nonpalpable and nontender Pulmonary-no tachypnea or accessory muscle use; Clear  without wheezes via auscultation Card--no abnrml murmurs, rhythm reg and rate WNL Carotid pulses symmetric, without bruits        Assessment & Plan:  Impression 1 hypertension good control discussed maintain same #2 status post stroke clinically stable no new neurological symptoms #3 hyperlipidemia exact status uncertain discuss need to check blood work #4 type 2 diabetes excellent control discussed #5 cerumen impaction substantial #6 coronary artery disease asymptomatic plan diet exercise discussed. Patient declines preventive measures. Appropriate load work medications refilled ENT referral

## 2016-12-26 DIAGNOSIS — Z79899 Other long term (current) drug therapy: Secondary | ICD-10-CM | POA: Diagnosis not present

## 2016-12-26 DIAGNOSIS — E119 Type 2 diabetes mellitus without complications: Secondary | ICD-10-CM | POA: Diagnosis not present

## 2016-12-26 DIAGNOSIS — H6123 Impacted cerumen, bilateral: Secondary | ICD-10-CM | POA: Diagnosis not present

## 2016-12-27 ENCOUNTER — Other Ambulatory Visit: Payer: Self-pay | Admitting: Family Medicine

## 2016-12-27 LAB — LIPID PANEL
CHOLESTEROL TOTAL: 175 mg/dL (ref 100–199)
Chol/HDL Ratio: 3.3 ratio (ref 0.0–4.4)
HDL: 53 mg/dL (ref >39)
LDL Calculated: 82 mg/dL (ref 0–99)
Triglycerides: 202 mg/dL — ABNORMAL HIGH (ref 0–149)
VLDL CHOLESTEROL CAL: 40 mg/dL (ref 5–40)

## 2016-12-27 LAB — HEPATIC FUNCTION PANEL
ALBUMIN: 4.4 g/dL (ref 3.5–4.7)
ALK PHOS: 86 IU/L (ref 39–117)
ALT: 16 IU/L (ref 0–32)
AST: 16 IU/L (ref 0–40)
BILIRUBIN, DIRECT: 0.12 mg/dL (ref 0.00–0.40)
Bilirubin Total: 0.4 mg/dL (ref 0.0–1.2)
TOTAL PROTEIN: 6.9 g/dL (ref 6.0–8.5)

## 2016-12-29 ENCOUNTER — Encounter: Payer: Self-pay | Admitting: Family Medicine

## 2016-12-29 NOTE — Telephone Encounter (Signed)
Ok six mo worth if time

## 2016-12-31 ENCOUNTER — Encounter: Payer: Self-pay | Admitting: Family Medicine

## 2017-01-08 ENCOUNTER — Ambulatory Visit (INDEPENDENT_AMBULATORY_CARE_PROVIDER_SITE_OTHER): Payer: PPO | Admitting: Nurse Practitioner

## 2017-01-08 ENCOUNTER — Encounter: Payer: Self-pay | Admitting: Nurse Practitioner

## 2017-01-08 VITALS — BP 140/80 | HR 52 | Ht 61.0 in | Wt 146.0 lb

## 2017-01-08 DIAGNOSIS — E782 Mixed hyperlipidemia: Secondary | ICD-10-CM

## 2017-01-08 DIAGNOSIS — Z8673 Personal history of transient ischemic attack (TIA), and cerebral infarction without residual deficits: Secondary | ICD-10-CM | POA: Diagnosis not present

## 2017-01-08 DIAGNOSIS — G3184 Mild cognitive impairment, so stated: Secondary | ICD-10-CM

## 2017-01-08 DIAGNOSIS — I10 Essential (primary) hypertension: Secondary | ICD-10-CM

## 2017-01-08 NOTE — Patient Instructions (Addendum)
Stressed the importance of management of risk factors to prevent further stroke Continue aspirin for secondary stroke prevention Maintain strict control of hypertension with blood pressure goal below 130/90, today's reading 140/80 continue antihypertensive medications Cholesterol with LDL cholesterol less than 70, followed by primary care,  most recent 82 continue statin drugs Lescol Exercise by walking, slowly increase , eat healthy diet with whole gr fresh fruits and vegetables Memory score is stable has not progressed, continue exercises for the memory, continued fish oil Will discharge [facility] REMEMBER Pneumonic FAST which stands for  F stands  for face drooping and weakness etc. A stands for arms, weakness S stands for speech slurred  T stands for time to call 911  Stroke Prevention Some medical conditions and behaviors are associated with an increased chance of having a stroke. You may prevent a stroke by making healthy choices and managing medical conditions. How can I reduce my risk of having a stroke?  Stay physically active. Get at least 30 minutes of activity on most or all days.  Do not smoke. It may also be helpful to avoid exposure to secondhand smoke.  Limit alcohol use. Moderate alcohol use is considered to be:  No more than 2 drinks per day for men.  No more than 1 drink per day for nonpregnant women.  Eat healthy foods. This involves:  Eating 5 or more servings of fruits and vegetables a day.  Making dietary changes that address high blood pressure (hypertension), high cholesterol, diabetes, or obesity.  Manage your cholesterol levels.  Making food choices that are high in fiber and low in saturated fat, trans fat, and cholesterol may control cholesterol levels.  Take any prescribed medicines to control cholesterol as directed by your health care provider.  Manage your diabetes.  Controlling your carbohydrate and sugar intake is recommended to  manage diabetes.  Take any prescribed medicines to control diabetes as directed by your health care provider.  Control your hypertension.  Making food choices that are low in salt (sodium), saturated fat, trans fat, and cholesterol is recommended to manage hypertension.  Ask your health care provider if you need treatment to lower your blood pressure. Take any prescribed medicines to control hypertension as directed by your health care provider.  If you are 66-64 years of age, have your blood pressure checked every 3-5 years. If you are 66 years of age or older, have your blood pressure checked every year.  Maintain a healthy weight.  Reducing calorie intake and making food choices that are low in sodium, saturated fat, trans fat, and cholesterol are recommended to manage weight.  Stop drug abuse.  Avoid taking birth control pills.  Talk to your health care provider about the risks of taking birth control pills if you are over 80 years old, smoke, get migraines, or have ever had a blood clot.  Get evaluated for sleep disorders (sleep apnea).  Talk to your health care provider about getting a sleep evaluation if you snore a lot or have excessive sleepiness.  Take medicines only as directed by your health care provider.  For some people, aspirin or blood thinners (anticoagulants) are helpful in reducing the risk of forming abnormal blood clots that can lead to stroke. If you have the irregular heart rhythm of atrial fibrillation, you should be on a blood thinner unless there is a good reason you cannot take them.  Understand all your medicine instructions.  Make sure that other conditions (such as anemia or  atherosclerosis) are addressed. Get help right away if:  You have sudden weakness or numbness of the face, arm, or leg, especially on one side of the body.  Your face or eyelid droops to one side.  You have sudden confusion.  You have trouble speaking (aphasia) or  understanding.  You have sudden trouble seeing in one or both eyes.  You have sudden trouble walking.  You have dizziness.  You have a loss of balance or coordination.  You have a sudden, severe headache with no known cause.  You have new chest pain or an irregular heartbeat. Any of these symptoms may represent a serious problem that is an emergency. Do not wait to see if the symptoms will go away. Get medical help at once. Call your local emergency services (911 in [place] Do not drive yourself to the hospital. This information is not intended to replace advice given to you by your health care provider. Make sure you discuss any questions you have with your health care provider. Document Released: 09/26/2004 Document Revised: 01/25/2016 Document Reviewed: 02/19/2013 Elsevier Interactive Patient Education  2017 [employer].

## 2017-01-08 NOTE — Progress Notes (Signed)
[facility]  PATIENT: [name] DOB: Jan 30, 1934   REASON FOR VISIT: History of CVA, mild cognitive impairment which has not progressed HISTORY FROM: Patient    HISTORY OF PRESENT ILLNESS: Update 07/01/13 PSShe returns for f/u after last visit 12/30/11.She is doing well and has not any further stroke or TIA symptoms for over 2 years.She continues to have mild short term memory difficulties which are not specifically progressive and she remains independent in ADls and manages her own affairs. She states her blood pressure is well controlled.She has not had any new health complaints.She states depression is well controlled.   12/30/13 (LL): Patient returns for stroke follow up. She has been doing well. Carotid dopplers completed in November 2014 was consistent with stenosis of the right PCA also with presence of acoustic shadow plaque. Acoustic shadow plaque was observed in the right bulb. She states her blood pressure is well controlled at home although is elevated today. BP in office today is 153/64. She does not feel like her memory is any worse than last visit. She is not had any further stroke or TIA symptoms. She is tolerating daily aspirin well without serious bruising or bleeding. 01/10/2015 : She returns for follow-up after last visit a year ago. She continues to do well without recurrent stroke or TIA symptoms. She states her blood pressure at home is quite well controlled though it is slightly elevated in our office today at 149/71. She continues to have mild short-term memory difficulties but these are unchanged and not progressive. On Mini-Mental status exam today she scored 23/30. She has been taking fish oil but has not been doing cognitively challenging activities. She had carotid ultrasound done in November 2014 which I have personally reviewed and shows no significant extracranial stenosis. UPDATE 05/09/2017CM [name], 83 year old female returns  for follow-up. She has a history of stroke and  mild cognitive impairment. She has not had any stroke or TIA symptoms several years. She is currently on aspirin for secondary stroke prevention. Blood pressure was well-controlled in 120/76 in the office today. Most recent LDL was 88 and she remains on Lescol. Memory score is stable at 25/30 . This has remained stable and has not progressed. She continues to take fish oil and do cognitively challenging activities. Carotid ultrasound June 2016 was negative for  significant stenosis. She returns for reevaluation UPDATE 05/09/2018[name], 83 year old female returns for follow-up with history of stroke right thalmic in 2012. She also has mild cognitive impairment which has not progressed over time. She has not had further stroke or TIA symptoms since 2012 she is currently on aspirin for secondary stroke prevention with minimal bruising and bleeding. Patient remains on Lescol for cholesterol.  Blood pressure in the office today 140/80. Patient states her blood pressure is elevated because her husband went a different way today to get here and they got lost. She was afraid she was going to miss her  appointment time. Most recent LDL at primary care 12/26/2016 was 82. Hemoglobin A1c 5.3. Hepatic function within normal limits. She also follows with Dr. Domenic Polite for coronary artery disease. Her memory score today is 26 out of 30. This has not progressed over time. Appetite is good. She is sleeping well. She denies any falls. She denies any new neurologic symptoms. She returns for reevaluation of REVIEW OF SYSTEMS: Full 14 system review of systems performed and notable only for those listed, all others are neg:  Constitutional: neg  Cardiovascular: neg Ear/Nose/Throat:  neg  Skin: neg Eyes: neg Respiratory: neg Gastroitestinal: neg  Hematology/Lymphatic: neg  Endocrine: neg Musculoskeletal:neg Allergy/Immunology: neg Neurological: neg Psychiatric:  neg Sleep : neg   ALLERGIES: Allergies  Allergen Reactions  . Macrodantin   . Pravachol [Pravastatin Sodium]   . Tetanus Toxoids     HOME MEDICATIONS: Outpatient Medications Prior to Visit  Medication Sig Dispense Refill  . ALPRAZolam (XANAX) 0.25 MG tablet TAKE ONE TABLET BY MOUTH DAILY AS NEEDED. 30 tablet 5  . amLODipine (NORVASC) 2.5 MG tablet TAKE ONE TABLET BY MOUTH ONCE DAILY. 30 tablet 6  . aspirin 81 MG tablet Take 81 mg by mouth daily.      . calcium citrate-vitamin D (CITRACAL+D) 315-200 MG-UNIT per tablet Take 1 tablet by mouth every other day.     . citalopram (CELEXA) 20 MG tablet TAKE 1 AND 1/2 TABLETS BY MOUTH ONCE DAILY. 45 tablet 5  . Coenzyme Q10 150 MG CAPS Take 150 mg by mouth every other day.     . fluvastatin (LESCOL) 40 MG capsule TAKE (1) CAPSULE BY MOUTH EVERY DAY FOR INCREASED CHOLESTEROL. 30 capsule 5  . metoprolol succinate (TOPROL-XL) 25 MG 24 hr tablet TAKE (1) TABLET BY MOUTH AT BEDTIME. 30 tablet 5  . Multiple Vitamins-Minerals (CENTRUM) tablet Take 1 tablet by mouth daily.     . nitroGLYCERIN (NITROSTAT) 0.4 MG SL tablet Dissolve 1 tablet under tongue every 5 mins up to 15 mins for chest pain. If no relief call 911. 25 tablet 0  . Omega-3 Fatty Acids (FISH OIL) 1000 MG CAPS Take 1,000 mg by mouth daily.     . ONE TOUCH ULTRA TEST test strip USE AS DIRECTED ONCE DAILY. 50 each 5  . ramipril (ALTACE) 5 MG capsule Take 1 capsule (5 mg total) by mouth 2 (two) times daily. 60 capsule 5  . TURMERIC PO Take by mouth 2 (two) times daily.     No facility-administered medications prior to visit.     PAST MEDICAL HISTORY: Past Medical History:  Diagnosis Date  . Allergic rhinitis   . Cataracts, bilateral   . Coronary atherosclerosis of native coronary artery    DES LAD 3/12, 99% nondominant RCA, No CAD otherwise, LVEF 65%  . Diabetic retinopathy ([place])   . Diverticulitis   . Essential hypertension, benign   . GERD (gastroesophageal reflux disease)   .  Hemorrhoids   . Hyperlipidemia   . Stroke, hemorrhagic ([place])    Right thalamic hemorrhage 4/12  . Syncope    Neurally mediated  . Type 2 diabetes mellitus ([place])     PAST SURGICAL HISTORY: Past Surgical History:  Procedure Laterality Date  . BREAST CYST EXCISION    . CARDIAC CATHETERIZATION    . CAROTID STENT  11/16/10  . COLONOSCOPY N/A 10/02/2014   Dr. Rourk:engorged internal hemorrhoids/pancolonic diverticulosis  . ESOPHAGOGASTRODUODENOSCOPY  10/02/14   Dr. Rourk:normal esophagus 2 cm hiatal hernia, gastric erosions likely from NSAID effect    FAMILY HISTORY: Family History  Problem Relation Age of Onset  . Pancreatic cancer Mother     Died at age 49  . Diabetes Mother   . Cancer Mother   . Coronary artery disease Father     Died in his 42s  . Heart attack Father   . Hypertension Father   . Breast cancer Sister   . Cancer Sister     SOCIAL HISTORY: Social History   Social History  . Marital status: Married    Spouse name: N/A  .  Number of children: 2  . Years of education: 12   Occupational History  . Retired    Social History Main Topics  . Smoking status: Former Smoker    Packs/day: 0.25    Types: Cigarettes    Quit date: 09/02/1988  . Smokeless tobacco: Never Used  . Alcohol use No  . Drug use: No  . Sexual activity: No   Other Topics Concern  . Not on file   Social History Narrative   Married   Right handed   Caffeine use - coffee x 2 cups     PHYSICAL EXAM  Vitals:   01/08/17 1257  BP: (!) 140/80   Pulse: (!) 52  Weight: 146 lb (66.2 kg)  Height: 5\' 1"  (1.549 m)   Body mass index is 27.59 kg/m. General: Elderly Caucasian lady, seated, in no evident distress , well-groomed Head: head normocephalic and atraumatic.  Neck: supple with no carotid  bruits  Cardiovascular: regular rate and rhythm, no murmurs  Musculoskeletal: no deformity  Skin: no rash/petichiae  Neurological examination   Mentation: Awake and fully alert.  Oriented to place and time. Recent and remote memory intact. Attention span, concentration and fund of knowledge appropriate. Mood and affect appropriate. Mini-Mental status exam score 26/30 with deficits in attention calculation.  AFT 5. Clock drawing 4/4.   Cranial Nerves: Pupils equal, briskly reactive to light. Extraocular movements full without nystagmus. Visual fields full to confrontation. Hearing intact. Facial sensation intact. Face, tongue, palate moves normally and symmetrically.  Motor: Normal bulk and tone. Normal strength in all tested extremity muscles Sensory: intact to light touch  in the upper and lower extremities Coordination: Rapid alternating movements normal in all extremities. Finger-to-nose and heel-to-shin performed accurately bilaterally.  Gait and Station: Arises from chair without difficulty. Stance is normal. Gait demonstrates normal stride length and balance . Able to heel, toe and tandem walk without difficulty. No assistive device Reflexes: 1+ and symmetric.  DIAGNOSTIC DATA (LABS, IMAGING, TESTING) - I reviewed patient records, labs, notes, testing and imaging myself where available.  Lab Results  Component Value Date   WBC 5.7 06/28/2015   HGB 11.9 (L) 11/03/2014   HCT 39.8 06/28/2015   MCV 93 06/28/2015   PLT 250 06/28/2015      Component Value Date/Time   NA 141 06/27/2016 0856   K 4.8 06/27/2016 0856   CL 99 06/27/2016 0856   CO2 26 06/27/2016 0856   GLUCOSE 113 (H) 06/27/2016 0856   GLUCOSE 111 (H) 10/02/2014 0542   BUN 14 06/27/2016 0856   CREATININE 1.05 (H) 06/27/2016 0856   CREATININE 0.98 03/02/2014 0813   CALCIUM 9.7 06/27/2016 0856   PROT 6.9 12/26/2016 0821   ALBUMIN 4.4 12/26/2016 0821   AST 16 12/26/2016 0821   ALT 16 12/26/2016 0821   ALKPHOS 86 12/26/2016 0821   BILITOT 0.4 12/26/2016 0821   GFRNONAA 50 (L) 06/27/2016 0856   GFRAA 58 (L) 06/27/2016 0856   Lab Results  Component Value Date   CHOL 175 12/26/2016   HDL 53  12/26/2016   LDLCALC 82 12/26/2016   TRIG 202 (H) 12/26/2016   CHOLHDL 3.3 12/26/2016   Lab Results  Component Value Date   HGBA1C 5.3 12/25/2016      ASSESSMENT AND PLAN  83 y.o. year old female  has a past medical history of Essential hypertension, Type 2 diabetes mellitus ([place]); Stroke, hemorrhagic ([place]); Hyperlipidemia; and mild cognitive impairment here to follow-up.   She has not  had further stroke or TIA symptoms since 2012 and her mild cognitive impairment has not progressed. MMSE 26/30.   PLAN: Stressed the importance of management of risk factors to prevent further stroke Continue aspirin for secondary stroke prevention Maintain strict control of hypertension with blood pressure goal below 130/90, today's reading 140/80 continue antihypertensive medications Cholesterol with LDL cholesterol less than 70, followed by primary care,  most recent 82 continue statin drugs Lescol Exercise by walking, slowly increase , eat healthy diet with whole grains  fresh fruits and vegetables Memory score is stable has not progressed, continue exercises for the memory, continued fish oil Will discharge [facility] REMEMBER Pneumonic FAST which stands for  F stands  for face drooping and weakness etc. A stands for arms, weakness S stands for speech slurred  T stands for time to call 911 I spent 25 minutes in total face to face time with the patient more than 50% of which was spent counseling and coordination of care, reviewing test results reviewing medications and discussing and reviewing the diagnosis of stroke and importance of managing stroke risk factors. In addition she has mild cognitive impairment which has not progressed and she will continue her  memory exercises as she has already been doing [name], [facility], [facility], [facility] [address] [place], [place] [address] [phone]

## 2017-01-13 NOTE — Progress Notes (Signed)
I agree with the above plan 

## 2017-02-03 ENCOUNTER — Other Ambulatory Visit: Payer: Self-pay | Admitting: Cardiology

## 2017-02-10 ENCOUNTER — Encounter: Payer: Self-pay | Admitting: Family Medicine

## 2017-02-27 DIAGNOSIS — D225 Melanocytic nevi of trunk: Secondary | ICD-10-CM | POA: Diagnosis not present

## 2017-02-27 DIAGNOSIS — L821 Other seborrheic keratosis: Secondary | ICD-10-CM | POA: Diagnosis not present

## 2017-04-02 DIAGNOSIS — E11319 Type 2 diabetes mellitus with unspecified diabetic retinopathy without macular edema: Secondary | ICD-10-CM | POA: Diagnosis not present

## 2017-04-02 DIAGNOSIS — H25813 Combined forms of age-related cataract, bilateral: Secondary | ICD-10-CM | POA: Diagnosis not present

## 2017-04-02 DIAGNOSIS — E113293 Type 2 diabetes mellitus with mild nonproliferative diabetic retinopathy without macular edema, bilateral: Secondary | ICD-10-CM | POA: Diagnosis not present

## 2017-04-02 LAB — HM DIABETES EYE EXAM

## 2017-04-22 ENCOUNTER — Encounter: Payer: Self-pay | Admitting: Family Medicine

## 2017-06-17 ENCOUNTER — Other Ambulatory Visit: Payer: Self-pay | Admitting: Family Medicine

## 2017-06-24 ENCOUNTER — Ambulatory Visit (INDEPENDENT_AMBULATORY_CARE_PROVIDER_SITE_OTHER): Payer: PPO | Admitting: Family Medicine

## 2017-06-24 ENCOUNTER — Encounter: Payer: Self-pay | Admitting: Family Medicine

## 2017-06-24 VITALS — BP 142/78 | Ht 61.0 in | Wt 149.0 lb

## 2017-06-24 DIAGNOSIS — Z0000 Encounter for general adult medical examination without abnormal findings: Secondary | ICD-10-CM | POA: Diagnosis not present

## 2017-06-24 DIAGNOSIS — E119 Type 2 diabetes mellitus without complications: Secondary | ICD-10-CM | POA: Diagnosis not present

## 2017-06-24 DIAGNOSIS — Z79899 Other long term (current) drug therapy: Secondary | ICD-10-CM

## 2017-06-24 DIAGNOSIS — I10 Essential (primary) hypertension: Secondary | ICD-10-CM

## 2017-06-24 DIAGNOSIS — Z23 Encounter for immunization: Secondary | ICD-10-CM | POA: Diagnosis not present

## 2017-06-24 DIAGNOSIS — E782 Mixed hyperlipidemia: Secondary | ICD-10-CM | POA: Diagnosis not present

## 2017-06-24 DIAGNOSIS — Z13220 Encounter for screening for lipoid disorders: Secondary | ICD-10-CM

## 2017-06-24 LAB — POCT GLYCOSYLATED HEMOGLOBIN (HGB A1C): Hemoglobin A1C: 5.1

## 2017-06-24 LAB — POCT GLUCOSE (DEVICE FOR HOME USE): POC Glucose: 126 mg/dl — AB (ref 70–99)

## 2017-06-24 MED ORDER — METOPROLOL SUCCINATE ER 25 MG PO TB24: ORAL_TABLET | ORAL | 6 refills | Status: DC

## 2017-06-24 MED ORDER — GLUCOSE BLOOD VI STRP: ORAL_STRIP | 6 refills | Status: DC

## 2017-06-24 MED ORDER — RAMIPRIL 5 MG PO CAPS: 5.0000 mg | ORAL_CAPSULE | Freq: Two times a day (BID) | ORAL | 6 refills | Status: DC

## 2017-06-24 MED ORDER — ESCITALOPRAM OXALATE 20 MG PO TABS: 20.0000 mg | ORAL_TABLET | Freq: Every day | ORAL | 6 refills | Status: DC

## 2017-06-24 NOTE — Patient Instructions (Signed)
Keep working on diet and exercise  Be sure to see an eye doctor regularly as you have done  Hopefully this lexapro will help you more

## 2017-06-24 NOTE — Progress Notes (Signed)
Subjective:    Patient ID: [name], female    DOB: 1934/05/04, 83 y.o.   MRN: [id number] Patient arrives office for a wellness plus numerous chronic medical concerns Diabetes  Pertinent negatives for hypoglycemia include no headaches. Pertinent negatives for diabetes include no chest pain, no fatigue, no polyphagia and no weakness.  AWV- Annual Wellness Visit  The patient was seen for their annual wellness visit. The patient's past medical history, surgical history, and family history were reviewed. Pertinent vaccines were reviewed ( tetanus, pneumonia, shingles, flu) The patient's medication list was reviewed and updated.  The height and weight were entered. The patient's current BMI is:See results  Cognitive screening was completed. Outcome of Mini - Cog: within normal limits  Falls within the past 6 months:none  Current tobacco usage: none (All patients who use tobacco were given written and verbal information on quitting)  Recent listing of emergency department/hospitalizations over the past year were reviewed.  current specialist the patient sees on a regular basis:cardiologist/neurologist   Medicare annual wellness visit patient questionnaire was reviewed.  A written screening schedule for the patient for the next 5-10 years was given. Appropriate discussion of followup regarding next visit was discussed.    Patient is here today to follow up on her type 2 diabetes. She states she controls it by her diet. Blood glucose here in the office today was 126. It was checked today as the pt states she had ran out of testing strips at home. Her A1 c was done today as well. She states she eats pretty healthy and does not get much exercise. She sees  Dr. Jorja Loa here in [place] for her eyes.  Results for orders placed or performed in visit on 06/24/17  POCT glycosylated hemoglobin (Hb A1C)  Result Value Ref Range   Hemoglobin A1C 5.1   POCT Glucose (Device for Home  Use)  Result Value Ref Range   Glucose Fasting, POC  70 - 99 mg/dL   POC Glucose 126 (A) 70 - 99 mg/dl   Patient claims compliance with diabetes medication. No obvious side effects. Reports no substantial low sugar spells. Most numbers are generally in good range when checked fasting. Generally does not miss a dose of medication. Watching diabetic diet closely  Blood pressure medicine and blood pressure levels reviewed today with patient. Compliant with blood pressure medicine. States does not miss a dose. No obvious side effects. Blood pressure generally good when checked elsewhere. Watching salt intake.   Patient continues to take lipid medication regularly. No obvious side effects from it. Generally does not miss a dose. Prior blood work results are reviewed with patient. Patient continues to work on fat intake in diet  Patient notes ongoing compliance with antidepressant medication. No obvious side effects. Reports does not miss a dose. Overall continues to help depression substantially, but would like to try something else. No thoughts of homicide or suicide. Would like to maintain medication.    Review of Systems  Constitutional: Negative for activity change, appetite change and fatigue.  HENT: Negative for congestion and rhinorrhea.   Eyes: Negative for discharge.  Respiratory: Negative for cough, chest tightness and wheezing.   Cardiovascular: Negative for chest pain.  Gastrointestinal: Negative for abdominal pain, blood in stool and vomiting.  Endocrine: Negative for polyphagia.  Genitourinary: Negative for difficulty urinating and frequency.  Musculoskeletal: Negative for neck pain.  Skin: Negative for color change.  Allergic/Immunologic: Negative for environmental allergies and food allergies.  Neurological: Negative for  weakness and headaches.  Psychiatric/Behavioral: Negative for agitation and behavioral problems.  All other systems reviewed and are negative.        Objective:   Physical Exam  Constitutional: She is oriented to person, place, and time. She appears well-developed and well-nourished.  HENT:  Head: Normocephalic and atraumatic.  Right Ear: External ear normal.  Left Ear: External ear normal.  Eyes: Right eye exhibits no discharge. Left eye exhibits no discharge.  Neck: Normal range of motion. No tracheal deviation present.  Cardiovascular: Normal rate, regular rhythm, normal heart sounds and intact distal pulses.  Exam reveals no gallop.   No murmur heard. Pulmonary/Chest: Effort normal and breath sounds normal. No stridor. No respiratory distress. She has no wheezes. She has no rales.  Abdominal: Soft. Bowel sounds are normal. She exhibits no distension and no mass. There is no tenderness. There is no rebound and no guarding.  Genitourinary:  Genitourinary Comments: atient declines pelvic exam rectal exam and breast exam  Musculoskeletal: Normal range of motion. She exhibits no edema or tenderness.  Lymphadenopathy:    She has no cervical adenopathy.  Neurological: She is alert and oriented to person, place, and time. She exhibits normal muscle tone.  Skin: Skin is warm and dry.  Psychiatric: She has a normal mood and affect. Her behavior is normal.  Vitals reviewed.         Assessment & Plan:  Impression 1 wellness exam.Vaccines discussed. Flu shot administered. Patient wants no further colonoscopies or mammograms. Discussed. Diet discussed. Exercise discussed. Mental health discussed. See below.  #2 type 2 diabetes excellent control discussed maintain same approach  #3 hypertension good control discussed maintain same  #4 depression with element of anxiety. Somewhat worsened of late. Discussion held. Patient would like to try different medication.  35 hyperlipidemia discussed/blood work reviewed. Medications maintained.follow-up as scheduled blood work or

## 2017-06-25 DIAGNOSIS — E119 Type 2 diabetes mellitus without complications: Secondary | ICD-10-CM | POA: Diagnosis not present

## 2017-06-25 DIAGNOSIS — Z79899 Other long term (current) drug therapy: Secondary | ICD-10-CM | POA: Diagnosis not present

## 2017-06-25 DIAGNOSIS — Z13220 Encounter for screening for lipoid disorders: Secondary | ICD-10-CM | POA: Diagnosis not present

## 2017-06-26 LAB — BASIC METABOLIC PANEL
BUN/Creatinine Ratio: 14 (ref 12–28)
BUN: 15 mg/dL (ref 8–27)
CALCIUM: 10.1 mg/dL (ref 8.7–10.3)
CO2: 26 mmol/L (ref 20–29)
Chloride: 97 mmol/L (ref 96–106)
Creatinine, Ser: 1.04 mg/dL — ABNORMAL HIGH (ref 0.57–1.00)
GFR calc Af Amer: 58 mL/min/{1.73_m2} — ABNORMAL LOW (ref >59)
GFR, EST NON AFRICAN AMERICAN: 51 mL/min/{1.73_m2} — AB (ref >59)
Glucose: 112 mg/dL — ABNORMAL HIGH (ref 65–99)
Potassium: 4.6 mmol/L (ref 3.5–5.2)
SODIUM: 137 mmol/L (ref 134–144)

## 2017-06-26 LAB — LIPID PANEL
Chol/HDL Ratio: 3.1 ratio (ref 0.0–4.4)
Cholesterol, Total: 181 mg/dL (ref 100–199)
HDL: 58 mg/dL (ref >39)
LDL Calculated: 80 mg/dL (ref 0–99)
Triglycerides: 215 mg/dL — ABNORMAL HIGH (ref 0–149)
VLDL CHOLESTEROL CAL: 43 mg/dL — AB (ref 5–40)

## 2017-06-26 LAB — HEPATIC FUNCTION PANEL
ALBUMIN: 4.6 g/dL (ref 3.5–4.7)
ALK PHOS: 90 IU/L (ref 39–117)
ALT: 14 IU/L (ref 0–32)
AST: 22 IU/L (ref 0–40)
Bilirubin Total: 0.5 mg/dL (ref 0.0–1.2)
Bilirubin, Direct: 0.13 mg/dL (ref 0.00–0.40)
TOTAL PROTEIN: 7.1 g/dL (ref 6.0–8.5)

## 2017-06-26 LAB — MICROALBUMIN / CREATININE URINE RATIO
Creatinine, Urine: 116.4 mg/dL
MICROALB/CREAT RATIO: 9.2 mg/g{creat} (ref 0.0–30.0)
MICROALBUM., U, RANDOM: 10.7 ug/mL

## 2017-07-01 ENCOUNTER — Encounter: Payer: Self-pay | Admitting: Family Medicine

## 2017-07-07 ENCOUNTER — Other Ambulatory Visit: Payer: Self-pay | Admitting: Family Medicine

## 2017-07-08 ENCOUNTER — Encounter: Payer: Self-pay | Admitting: Family Medicine

## 2017-07-08 ENCOUNTER — Telehealth: Payer: Self-pay | Admitting: Family Medicine

## 2017-07-08 NOTE — Telephone Encounter (Signed)
Tried calling-no answer.  

## 2017-07-08 NOTE — Telephone Encounter (Signed)
Requesting results to lab work.

## 2017-07-08 NOTE — Telephone Encounter (Signed)
Pt had labs drawn at the end of Oct 2018. She would like to know how it turned out. Please advise.Thanks,CS

## 2017-07-08 NOTE — Telephone Encounter (Signed)
Read her 10 30 letter and send her copy be sure to let her know I dict this one wk ago, not sure what is happening here, will work on this when I get a chance

## 2017-07-09 ENCOUNTER — Other Ambulatory Visit: Payer: Self-pay | Admitting: Family Medicine

## 2017-07-10 NOTE — Telephone Encounter (Signed)
Six mo worth 

## 2017-07-10 NOTE — Telephone Encounter (Signed)
Patient aware of all.

## 2017-09-23 ENCOUNTER — Other Ambulatory Visit: Payer: Self-pay | Admitting: Cardiology

## 2017-10-28 ENCOUNTER — Other Ambulatory Visit: Payer: Self-pay | Admitting: Cardiology

## 2017-11-11 ENCOUNTER — Other Ambulatory Visit: Payer: Self-pay | Admitting: Family Medicine

## 2017-12-12 ENCOUNTER — Other Ambulatory Visit: Payer: Self-pay | Admitting: Family Medicine

## 2017-12-22 ENCOUNTER — Encounter: Payer: Self-pay | Admitting: Family Medicine

## 2017-12-22 ENCOUNTER — Ambulatory Visit (INDEPENDENT_AMBULATORY_CARE_PROVIDER_SITE_OTHER): Payer: PPO | Admitting: Family Medicine

## 2017-12-22 VITALS — BP 130/82 | Ht 61.0 in | Wt 149.2 lb

## 2017-12-22 DIAGNOSIS — I10 Essential (primary) hypertension: Secondary | ICD-10-CM | POA: Diagnosis not present

## 2017-12-22 DIAGNOSIS — E119 Type 2 diabetes mellitus without complications: Secondary | ICD-10-CM | POA: Diagnosis not present

## 2017-12-22 DIAGNOSIS — Z8673 Personal history of transient ischemic attack (TIA), and cerebral infarction without residual deficits: Secondary | ICD-10-CM | POA: Diagnosis not present

## 2017-12-22 DIAGNOSIS — E782 Mixed hyperlipidemia: Secondary | ICD-10-CM

## 2017-12-22 LAB — POCT GLYCOSYLATED HEMOGLOBIN (HGB A1C): Hemoglobin A1C: 5.5

## 2017-12-22 MED ORDER — AMLODIPINE BESYLATE 2.5 MG PO TABS: 2.5000 mg | ORAL_TABLET | Freq: Every day | ORAL | 6 refills | Status: DC

## 2017-12-22 MED ORDER — RAMIPRIL 5 MG PO CAPS: 5.0000 mg | ORAL_CAPSULE | Freq: Two times a day (BID) | ORAL | 6 refills | Status: DC

## 2017-12-22 MED ORDER — ALPRAZOLAM 0.25 MG PO TABS: 0.2500 mg | ORAL_TABLET | Freq: Every day | ORAL | 5 refills | Status: DC | PRN

## 2017-12-22 MED ORDER — FLUVASTATIN SODIUM 40 MG PO CAPS: ORAL_CAPSULE | ORAL | 5 refills | Status: DC

## 2017-12-22 MED ORDER — ESCITALOPRAM OXALATE 20 MG PO TABS: 20.0000 mg | ORAL_TABLET | Freq: Every day | ORAL | 6 refills | Status: DC

## 2017-12-22 MED ORDER — METOPROLOL SUCCINATE ER 25 MG PO TB24: ORAL_TABLET | ORAL | 6 refills | Status: DC

## 2017-12-22 NOTE — Progress Notes (Signed)
   Subjective:    Patient ID: [name], female    DOB: 1934/05/27, 84 y.o.   MRN: [id number]  Diabetes  She presents for her follow-up diabetic visit. She has type 2 diabetes mellitus. Risk factors for coronary artery disease include diabetes mellitus, dyslipidemia, hypertension and post-menopausal. Current diabetic treatment includes diet. She is compliant with treatment all of the time. Her weight is stable. She is following a diabetic diet. She has not had a previous visit with a dietitian. She does not see a podiatrist.Eye exam is not current.    Results for orders placed or performed in visit on 12/22/17  POCT glycosylated hemoglobin (Hb A1C)  Result Value Ref Range   Hemoglobin A1C 5.5    Patient claims compliance with diabetes medication. No obvious side effects. Reports no substantial low sugar spells. Most numbers are generally in good range when checked fasting. Generally does not miss a dose of medication. Watching diabetic diet closely  Blood pressure medicine and blood pressure levels reviewed today with patient. Compliant with blood pressure medicine. States does not miss a dose. No obvious side effects. Blood pressure generally good when checked elsewhere. Watching salt intake.   Patient continues to take lipid medication regularly. No obvious side effects from it. Generally does not miss a dose. Prior blood work results are reviewed with patient. Patient continues to work on fat intake in diet   Not much walking at this point Diet overall decent watching iet  BP usually in the good range    Review of Systems    No headache, no major weight loss or weight gain, no chest pain no back pain abdominal pain no change in bowel habits complete ROS otherwise negative  Objective:   Physical Exam  Alert and oriented, vitals reviewed and stable, NAD ENT-TM's and ext canals WNL bilat via otoscopic exam Soft palate, tonsils and post pharynx WNL via oropharyngeal  exam Neck-symmetric, no masses; thyroid nonpalpable and nontender Pulmonary-no tachypnea or accessory muscle use; Clear without wheezes via auscultation Card--no abnrml murmurs, rhythm reg and rate WNL Carotid pulses symmetric, without bruits   Patient      Assessment & Plan:  13 impression type 2 diabetes.  A1c excellent control.  Discussed.  To maintain same approach  2.  Hypertension good control discussed to maintain same medicines  3.  Hyperlipidemia.  Prior blood work reviewed.  Discussed to maintain same meds  4.  History of stroke no recent new stroke symptomatology thankfully  Follow-up in 6 months for a wellness plus chronic visit.  Diet exercise discussed and encouraged

## 2017-12-23 DIAGNOSIS — I10 Essential (primary) hypertension: Secondary | ICD-10-CM | POA: Diagnosis not present

## 2017-12-23 DIAGNOSIS — E782 Mixed hyperlipidemia: Secondary | ICD-10-CM | POA: Diagnosis not present

## 2017-12-24 LAB — LIPID PANEL
CHOL/HDL RATIO: 3.5 ratio (ref 0.0–4.4)
Cholesterol, Total: 194 mg/dL (ref 100–199)
HDL: 55 mg/dL (ref >39)
LDL CALC: 105 mg/dL — AB (ref 0–99)
Triglycerides: 168 mg/dL — ABNORMAL HIGH (ref 0–149)
VLDL CHOLESTEROL CAL: 34 mg/dL (ref 5–40)

## 2017-12-24 LAB — HEPATIC FUNCTION PANEL
ALBUMIN: 4.7 g/dL (ref 3.5–4.7)
ALT: 15 IU/L (ref 0–32)
AST: 18 IU/L (ref 0–40)
Alkaline Phosphatase: 93 IU/L (ref 39–117)
Bilirubin Total: 0.3 mg/dL (ref 0.0–1.2)
Bilirubin, Direct: 0.1 mg/dL (ref 0.00–0.40)
TOTAL PROTEIN: 6.9 g/dL (ref 6.0–8.5)

## 2017-12-29 ENCOUNTER — Encounter: Payer: Self-pay | Admitting: Family Medicine

## 2018-03-03 NOTE — Progress Notes (Signed)
Cardiology Office Note  Date: 03/04/2018   ID: [name], DOB [birth year], MRN [id number]  PCP: [name] Kirschner, MD  Primary Cardiologist: [name] Lesches, MD   Chief Complaint  Patient presents with  . Coronary Artery Disease    History of Present Illness: [name] is an 84 y.o. female last seen in March 2018.  She is here for a follow-up visit.  Since last encounter she does not report any obvious angina symptoms or nitroglycerin use.  She has not been walking as regularly for exercise.  She reports NYHA class II dyspnea with typical activities, no palpitations or syncope.  We went over her current medications.  Cardiac regimen includes aspirin, Norvasc, fluvastatin, Toprol-XL, Altace, and as needed nitroglycerin.  LDL in April was 105.  I personally reviewed her ECG today which shows normal sinus rhythm.  Last ischemic testing was in 2016 as outlined below.  Past Medical History:  Diagnosis Date  . Allergic rhinitis   . Cataracts, bilateral   . Coronary atherosclerosis of native coronary artery    DES LAD 3/12, 99% nondominant RCA, No CAD otherwise, LVEF 65%  . Diabetic retinopathy ([place])   . Diverticulitis   . Essential hypertension, benign   . GERD (gastroesophageal reflux disease)   . Hemorrhoids   . Hyperlipidemia   . Stroke, hemorrhagic ([place])    Right thalamic hemorrhage 4/12  . Syncope    Neurally mediated  . Type 2 diabetes mellitus ([place])     Past Surgical History:  Procedure Laterality Date  . BREAST CYST EXCISION    . CARDIAC CATHETERIZATION    . CAROTID STENT  11/16/10  . COLONOSCOPY N/A 10/02/2014   Dr. Rourk:engorged internal hemorrhoids/pancolonic diverticulosis  . ESOPHAGOGASTRODUODENOSCOPY  10/02/14   Dr. Rourk:normal esophagus 2 cm hiatal hernia, gastric erosions likely from NSAID effect    Current Outpatient Medications  Medication Sig Dispense Refill  . ALPRAZolam (XANAX) 0.25 MG tablet Take 1 tablet (0.25 mg total) by  mouth daily as needed. 30 tablet 5  . amLODipine (NORVASC) 2.5 MG tablet Take 1 tablet (2.5 mg total) by mouth daily. 30 tablet 6  . aspirin 81 MG tablet Take 81 mg by mouth daily.      . calcium citrate-vitamin D (CITRACAL+D) 315-200 MG-UNIT per tablet Take 1 tablet by mouth every other day.     . Coenzyme Q10 150 MG CAPS Take 150 mg by mouth every other day.     . escitalopram (LEXAPRO) 20 MG tablet Take 1 tablet (20 mg total) by mouth daily. 30 tablet 6  . ezetimibe (ZETIA) 10 MG tablet Take 1 tablet (10 mg total) by mouth daily. 90 tablet 3  . fluvastatin (LESCOL) 40 MG capsule One tablet daily 30 capsule 5  . glucose blood (ONE TOUCH ULTRA TEST) test strip USE AS DIRECTED ONCE DAILY. 50 each 6  . metoprolol succinate (TOPROL-XL) 25 MG 24 hr tablet TAKE (1) TABLET BY MOUTH AT BEDTIME. 30 tablet 6  . Multiple Vitamins-Minerals (CENTRUM) tablet Take 1 tablet by mouth daily.     . nitroGLYCERIN (NITROSTAT) 0.4 MG SL tablet Dissolve 1 tablet under tongue every 5 mins up to 15 mins for chest pain. If no relief call 911. 25 tablet 0  . Omega-3 Fatty Acids (FISH OIL) 1000 MG CAPS Take 1,000 mg by mouth daily.     . ramipril (ALTACE) 5 MG capsule Take 1 capsule (5 mg total) by mouth 2 (two) times daily. 60 capsule 6  .  TURMERIC PO Take by mouth 2 (two) times daily.     No current facility-administered medications for this visit.    Allergies:  Macrodantin; Pravachol [pravastatin sodium]; and Tetanus toxoids   Social History: The patient  reports that she quit smoking about 29 years ago. Her smoking use included cigarettes. She smoked 0.25 packs per day. She has never used smokeless tobacco. She reports that she does not drink alcohol or use drugs.   ROS:  Please see the history of present illness. Otherwise, complete review of systems is positive for hearing loss.  All other systems are reviewed and negative.   Physical Exam: VS:  BP 130/70   Pulse 67   Ht 5\' 2"  (1.575 m)   Wt 148 lb (67.1  kg)   SpO2 96%   BMI 27.07 kg/m , BMI Body mass index is 27.07 kg/m.  Wt Readings from Last 3 Encounters:  03/04/18 148 lb (67.1 kg)  12/22/17 149 lb 3.2 oz (67.7 kg)  06/24/17 149 lb (67.6 kg)    General: Elderly woman, appears comfortable at rest. HEENT: Conjunctiva and lids normal, oropharynx clear. Neck: Supple, no elevated JVP or carotid bruits, no thyromegaly. Lungs: Clear to auscultation, nonlabored breathing at rest. Cardiac: Regular rate and rhythm, no S3 or significant systolic murmur. Abdomen: Soft, nontender, bowel sounds present. Extremities: No pitting edema, distal pulses 2+. Skin: Warm and dry. Musculoskeletal: No kyphosis. Neuropsychiatric: Alert and oriented x3, affect grossly appropriate.  ECG: I personally reviewed the tracing from 02/09/2016 which showed sinus bradycardia with nonspecific T-wave changes.  Recent Labwork: 06/25/2017: BUN 15; Creatinine, Ser 1.04; Potassium 4.6; Sodium 137 12/23/2017: ALT 15; AST 18     Component Value Date/Time   CHOL 194 12/23/2017 0825   TRIG 168 (H) 12/23/2017 0825   HDL 55 12/23/2017 0825   CHOLHDL 3.5 12/23/2017 0825   CHOLHDL 2.8 11/03/2014 0805   VLDL 35 11/03/2014 0805   LDLCALC 105 (H) 12/23/2017 0825    Other Studies Reviewed Today:  Lexiscan Cardiolite 05/16/2015:  There was no ST segment deviation noted during stress.  No significant defects to suggest scar or ischemia.  This is a low risk study.  Nuclear stress EF: 58%.  Assessment and Plan:  1.  CAD status post DES to the LAD in 2012 with nondominant RCA disease, managed medically.  Ischemic testing in 2016 was low risk.  She does not report any recurring angina symptoms and plan is to continue medical therapy and observation.  ECG is normal today.  Discussed more regular walking plan for exercise.  2.  Mixed hyperlipidemia.  She remains on fluvastatin, last LDL was 105.  She did not want to increase the dose any further.  We will add Zetia 10 mg  daily and reassess FLP in 12 weeks.  Would optimally like to get LDL closer to 70.  3.  Essential hypertension, blood pressure is adequately controlled today.  4.  Prior history of hemorrhagic right thalamic stroke in 2012.  No residual deficits.  Current medicines were reviewed with the patient today.   Orders Placed This Encounter  Procedures  . Lipid panel  . EKG 12-Lead    Disposition: Follow-up in 6 months.  Signed, Satira Sark, MD, [facility] 03/04/2018 9:13 AM    [facility] [place]. [address], [place],  [address] Phone: [phone]; Fax: [phone]

## 2018-03-04 ENCOUNTER — Ambulatory Visit (INDEPENDENT_AMBULATORY_CARE_PROVIDER_SITE_OTHER): Payer: PPO | Admitting: Cardiology

## 2018-03-04 ENCOUNTER — Encounter: Payer: Self-pay | Admitting: Cardiology

## 2018-03-04 VITALS — BP 130/70 | HR 67 | Ht 62.0 in | Wt 148.0 lb

## 2018-03-04 DIAGNOSIS — Z8673 Personal history of transient ischemic attack (TIA), and cerebral infarction without residual deficits: Secondary | ICD-10-CM | POA: Diagnosis not present

## 2018-03-04 DIAGNOSIS — E782 Mixed hyperlipidemia: Secondary | ICD-10-CM

## 2018-03-04 DIAGNOSIS — I10 Essential (primary) hypertension: Secondary | ICD-10-CM

## 2018-03-04 DIAGNOSIS — I25119 Atherosclerotic heart disease of native coronary artery with unspecified angina pectoris: Secondary | ICD-10-CM | POA: Diagnosis not present

## 2018-03-04 MED ORDER — EZETIMIBE 10 MG PO TABS: 10.0000 mg | ORAL_TABLET | Freq: Every day | ORAL | 3 refills | Status: DC

## 2018-03-04 NOTE — Patient Instructions (Signed)
Medication Instructions:   Your physician has recommended you make the following change in your medication:  Start zetia 10 mg by mouth daily.  Continue all other medications the same.  Labwork:  Your physician recommends that you return for a FASTING lipid profile: in 12 weeks. Please make sure that you are fasting at least 8 hours.  Testing/Procedures:  NONE  Follow-Up:  Your physician recommends that you schedule a follow-up appointment in: 6 months. You will receive a reminder letter in the mail in about 4 months reminding you to call and schedule your appointment. If you don't receive this letter, please contact our office.  Any Other Special Instructions Will Be Listed Below (If Applicable).  If you need a refill on your cardiac medications before your next appointment, please call your pharmacy.

## 2018-04-03 LAB — HM DIABETES EYE EXAM

## 2018-04-17 ENCOUNTER — Other Ambulatory Visit: Payer: Self-pay | Admitting: Family Medicine

## 2018-04-17 NOTE — Telephone Encounter (Signed)
Six mo worth ok 

## 2018-06-23 ENCOUNTER — Other Ambulatory Visit: Payer: Self-pay | Admitting: Family Medicine

## 2018-06-24 ENCOUNTER — Encounter: Payer: Self-pay | Admitting: Family Medicine

## 2018-06-24 ENCOUNTER — Ambulatory Visit (INDEPENDENT_AMBULATORY_CARE_PROVIDER_SITE_OTHER): Payer: PPO | Admitting: Family Medicine

## 2018-06-24 VITALS — BP 126/74 | Ht 62.0 in | Wt 149.0 lb

## 2018-06-24 DIAGNOSIS — Z23 Encounter for immunization: Secondary | ICD-10-CM | POA: Diagnosis not present

## 2018-06-24 DIAGNOSIS — E119 Type 2 diabetes mellitus without complications: Secondary | ICD-10-CM

## 2018-06-24 DIAGNOSIS — I10 Essential (primary) hypertension: Secondary | ICD-10-CM

## 2018-06-24 DIAGNOSIS — Z79899 Other long term (current) drug therapy: Secondary | ICD-10-CM | POA: Diagnosis not present

## 2018-06-24 DIAGNOSIS — E782 Mixed hyperlipidemia: Secondary | ICD-10-CM

## 2018-06-24 LAB — POCT GLYCOSYLATED HEMOGLOBIN (HGB A1C): Hemoglobin A1C: 5.3 % (ref 4.0–5.6)

## 2018-06-24 NOTE — Progress Notes (Signed)
   Subjective:    Patient ID: [name], female    DOB: 02-Apr-1934, 84 y.o.   MRN: [id number]  Diabetes  She presents for her follow-up diabetic visit. She has type 2 diabetes mellitus. Exercise: walks about one half a mile a few times a week. She does not see a podiatrist.Eye exam is current (04/03/18).   Results for orders placed or performed in visit on 06/24/18  POCT glycosylated hemoglobin (Hb A1C)  Result Value Ref Range   Hemoglobin A1C 5.3 4.0 - 5.6 %   HbA1c POC (<> result, manual entry)     HbA1c, POC (prediabetic range)     HbA1c, POC (controlled diabetic range)    Patient claims compliance with diabetes medication. No obvious side effects. Reports no substantial low sugar spells. Most numbers are generally in good range when checked fasting. Generally does not miss a dose of medication. Watching diabetic diet closely  Patient continues to take lipid medication regularly. No obvious side effects from it. Generally does not miss a dose. Prior blood work results are reviewed with patient. Patient continues to work on fat intake in diet  Blood pressure medicine and blood pressure levels reviewed today with patient. Compliant with blood pressure medicine. States does not miss a dose. No obvious side effects. Blood pressure generally good when checked elsewhere. Watching salt intake.   Patient notes ongoing compliance with antidepressant medication. No obvious side effects. Reports does not miss a dose. Overall continues to help depression substantially. No thoughts of homicide or suicide. Would like to maintain medication.     Flu vaccine today.   Pt states no concerns today.    Review of Systems No headache, no major weight loss or weight gain, no chest pain no back pain abdominal pain no change in bowel habits complete ROS otherwise negative     Objective:   Physical Exam   Alert and oriented, vitals reviewed and stable, NAD ENT-TM's and ext canals WNL bilat  via otoscopic exam Soft palate, tonsils and post pharynx WNL via oropharyngeal exam Neck-symmetric, no masses; thyroid nonpalpable and nontender Pulmonary-no tachypnea or accessory muscle use; Clear without wheezes via auscultation Card--no abnrml murmurs, rhythm reg and rate WNL Carotid pulses symmetric, without bruits      Assessment & Plan:  Impression 1 type 2 diabetes.  Good control discussed to maintain same meds  2.  Hypertension.  Good control.  Also maintain same approach diet discussed  3.  Hyperlipidemia status uncertain will check appropriate blood work  4.  Coronary artery disease  5.  History of stroke  Flu shot today.  Appropriate blood work.  Diet exercise discussed.  Medications refilled

## 2018-06-25 DIAGNOSIS — E119 Type 2 diabetes mellitus without complications: Secondary | ICD-10-CM | POA: Diagnosis not present

## 2018-06-25 DIAGNOSIS — I10 Essential (primary) hypertension: Secondary | ICD-10-CM | POA: Diagnosis not present

## 2018-06-25 DIAGNOSIS — E782 Mixed hyperlipidemia: Secondary | ICD-10-CM | POA: Diagnosis not present

## 2018-06-25 DIAGNOSIS — Z79899 Other long term (current) drug therapy: Secondary | ICD-10-CM | POA: Diagnosis not present

## 2018-06-26 LAB — BASIC METABOLIC PANEL
BUN/Creatinine Ratio: 13 (ref 12–28)
BUN: 15 mg/dL (ref 8–27)
CALCIUM: 9.9 mg/dL (ref 8.7–10.3)
CHLORIDE: 101 mmol/L (ref 96–106)
CO2: 25 mmol/L (ref 20–29)
Creatinine, Ser: 1.16 mg/dL — ABNORMAL HIGH (ref 0.57–1.00)
GFR calc Af Amer: 51 mL/min/{1.73_m2} — ABNORMAL LOW (ref >59)
GFR, EST NON AFRICAN AMERICAN: 44 mL/min/{1.73_m2} — AB (ref >59)
GLUCOSE: 109 mg/dL — AB (ref 65–99)
POTASSIUM: 5.1 mmol/L (ref 3.5–5.2)
SODIUM: 142 mmol/L (ref 134–144)

## 2018-06-26 LAB — HEPATIC FUNCTION PANEL
ALBUMIN: 4.8 g/dL — AB (ref 3.5–4.7)
ALT: 17 IU/L (ref 0–32)
AST: 19 IU/L (ref 0–40)
Alkaline Phosphatase: 98 IU/L (ref 39–117)
Bilirubin Total: 0.6 mg/dL (ref 0.0–1.2)
Bilirubin, Direct: 0.15 mg/dL (ref 0.00–0.40)
Total Protein: 6.9 g/dL (ref 6.0–8.5)

## 2018-06-26 LAB — LIPID PANEL
CHOL/HDL RATIO: 3 ratio (ref 0.0–4.4)
Cholesterol, Total: 158 mg/dL (ref 100–199)
HDL: 53 mg/dL (ref >39)
LDL CALC: 71 mg/dL (ref 0–99)
Triglycerides: 170 mg/dL — ABNORMAL HIGH (ref 0–149)
VLDL Cholesterol Cal: 34 mg/dL (ref 5–40)

## 2018-06-26 LAB — MICROALBUMIN / CREATININE URINE RATIO: CREATININE, UR: 41 mg/dL

## 2018-06-28 ENCOUNTER — Encounter: Payer: Self-pay | Admitting: Family Medicine

## 2018-07-06 ENCOUNTER — Other Ambulatory Visit: Payer: Self-pay | Admitting: Family Medicine

## 2018-07-13 ENCOUNTER — Telehealth: Payer: Self-pay | Admitting: Family Medicine

## 2018-07-13 NOTE — Telephone Encounter (Signed)
Calling to check on labwork she got a couple weeks ago. Would like the results and also would like labs mailed to her.

## 2018-07-13 NOTE — Telephone Encounter (Signed)
Please advise. Thank you

## 2018-07-13 NOTE — Telephone Encounter (Signed)
Letter with full explanation mailed two weeks ago, see letter, offer to have [facility] send again

## 2018-07-14 NOTE — Telephone Encounter (Signed)
Left message to return call 

## 2018-07-14 NOTE — Telephone Encounter (Signed)
Patient advised per Dr Richardson Landry: [place], your liver function tests are normal and your cholesterol panel is in good control and your sugar is improved, your kidney function continues to slowly decline, as it does in many patients as they get older, we will continue to monitor this yearly, stay on all your medications and keep working on your diet and staying active, Dr. Richardson Landry. Patient verbalized understanding.

## 2018-08-10 ENCOUNTER — Other Ambulatory Visit: Payer: Self-pay | Admitting: Family Medicine

## 2018-08-17 ENCOUNTER — Other Ambulatory Visit: Payer: Self-pay | Admitting: Family Medicine

## 2018-08-22 ENCOUNTER — Other Ambulatory Visit: Payer: Self-pay | Admitting: Family Medicine

## 2018-08-31 ENCOUNTER — Other Ambulatory Visit: Payer: Self-pay | Admitting: Family Medicine

## 2018-09-07 ENCOUNTER — Other Ambulatory Visit: Payer: Self-pay | Admitting: Family Medicine

## 2018-09-30 ENCOUNTER — Other Ambulatory Visit: Payer: Self-pay | Admitting: Family Medicine

## 2018-10-05 ENCOUNTER — Other Ambulatory Visit: Payer: Self-pay | Admitting: Family Medicine

## 2018-10-26 ENCOUNTER — Other Ambulatory Visit: Payer: Self-pay | Admitting: Family Medicine

## 2018-10-26 NOTE — Telephone Encounter (Signed)
Ok six mo worth 

## 2018-11-09 ENCOUNTER — Other Ambulatory Visit: Payer: Self-pay | Admitting: Family Medicine

## 2018-12-07 ENCOUNTER — Other Ambulatory Visit: Payer: Self-pay | Admitting: Family Medicine

## 2018-12-07 NOTE — Telephone Encounter (Signed)
Needs virtual visit 

## 2018-12-08 NOTE — Telephone Encounter (Signed)
Pt has visit scheduled for 12/24/2018 at 2 pm

## 2018-12-08 NOTE — Telephone Encounter (Signed)
good

## 2018-12-23 ENCOUNTER — Other Ambulatory Visit: Payer: Self-pay | Admitting: Family Medicine

## 2018-12-24 ENCOUNTER — Encounter: Payer: Self-pay | Admitting: Family Medicine

## 2018-12-24 ENCOUNTER — Ambulatory Visit (INDEPENDENT_AMBULATORY_CARE_PROVIDER_SITE_OTHER): Payer: PPO | Admitting: Family Medicine

## 2018-12-24 DIAGNOSIS — Z8673 Personal history of transient ischemic attack (TIA), and cerebral infarction without residual deficits: Secondary | ICD-10-CM

## 2018-12-24 DIAGNOSIS — E782 Mixed hyperlipidemia: Secondary | ICD-10-CM

## 2018-12-24 DIAGNOSIS — I10 Essential (primary) hypertension: Secondary | ICD-10-CM | POA: Diagnosis not present

## 2018-12-24 DIAGNOSIS — E119 Type 2 diabetes mellitus without complications: Secondary | ICD-10-CM | POA: Diagnosis not present

## 2018-12-24 MED ORDER — ALPRAZOLAM 0.25 MG PO TABS: 0.2500 mg | ORAL_TABLET | Freq: Every day | ORAL | 5 refills | Status: DC | PRN

## 2018-12-24 MED ORDER — RAMIPRIL 5 MG PO CAPS: 5.0000 mg | ORAL_CAPSULE | Freq: Two times a day (BID) | ORAL | 5 refills | Status: DC

## 2018-12-24 MED ORDER — METOPROLOL SUCCINATE ER 25 MG PO TB24: ORAL_TABLET | ORAL | 5 refills | Status: DC

## 2018-12-24 MED ORDER — GLUCOSE BLOOD VI STRP: ORAL_STRIP | 6 refills | Status: DC

## 2018-12-24 MED ORDER — FLUVASTATIN SODIUM 40 MG PO CAPS: ORAL_CAPSULE | ORAL | 5 refills | Status: DC

## 2018-12-24 MED ORDER — ESCITALOPRAM OXALATE 20 MG PO TABS: 20.0000 mg | ORAL_TABLET | Freq: Every day | ORAL | 5 refills | Status: DC

## 2018-12-24 MED ORDER — AMLODIPINE BESYLATE 2.5 MG PO TABS: 2.5000 mg | ORAL_TABLET | Freq: Every day | ORAL | 5 refills | Status: DC

## 2018-12-24 NOTE — Telephone Encounter (Signed)
sched virtual vist next wk, ok times one

## 2018-12-24 NOTE — Progress Notes (Signed)
   Subjective:    Patient ID: [name], female    DOB: 1933/09/05, 85 y.o.   MRN: [id number]  Diabetes  She presents for her follow-up diabetic visit. She has type 2 diabetes mellitus. There are no hypoglycemic associated symptoms. There are no diabetic associated symptoms. There are no hypoglycemic complications. She does not see a podiatrist.Eye exam is current.  pt states she takes her blood sugar once every week.   Virtual Visit via Telephone Note  I connected with [name] on 12/24/18 at  2:00 PM EDT by telephone and verified that I am speaking with the correct person using two identifiers.   I discussed the limitations, risks, security and privacy concerns of performing an evaluation and management service by telephone and the availability of in person appointments. I also discussed with the patient that there may be a patient responsible charge related to this service. The patient expressed understanding and agreed to proceed.   History of Present Illness:    Observations/Objective:   Assessment and Plan:   Follow Up Instructions:    I discussed the assessment and treatment plan with the patient. The patient was provided an opportunity to ask questions and all were answered. The patient agreed with the plan and demonstrated an understanding of the instructions.   The patient was advised to call back or seek an in-person evaluation if the symptoms worsen or if the condition fails to improve as anticipated.  I provided 25 minutes of non-face-to-face time during this encounter.  Patient claims compliance with diabetes medication. No obvious side effects. Reports no substantial low sugar spells. Most numbers are generally in good range when checked fasting. Generally does not miss a dose of medication. Watching diabetic diet closely  Blood pressure medicine and blood pressure levels reviewed today with patient. Compliant with blood pressure medicine.  States does not miss a dose. No obvious side effects. Blood pressure generally good when checked elsewhere. Watching salt intake. .  Patient continues to take lipid medication regularly. No obvious side effects from it. Generally does not miss a dose. Prior blood work results are reviewed with patient. Patient continues to work on fat intake in diet  [address], LPN   Review of Systems No headache, no major weight loss or weight gain, no chest pain no back pain abdominal pain no change in bowel habits complete ROS otherwise negative     Objective:   Physical Exam  Virtual visit      Assessment & Plan:  Impression #1 hypertension very good control discussed maintain it  2 type 2 diabetes.  Clinically stable discussed maintain same approach  3.  Hyperlipidemia.  Prior blood work good.  Hold off on any blood work at this time rationale discussed due to coronavirus  4.  History of stroke clinically stable  5.  Coronary artery disease clinically stable  Diet exercise reviewed

## 2018-12-28 ENCOUNTER — Other Ambulatory Visit: Payer: Self-pay | Admitting: Cardiology

## 2019-04-01 ENCOUNTER — Other Ambulatory Visit: Payer: Self-pay

## 2019-07-19 ENCOUNTER — Other Ambulatory Visit: Payer: Self-pay | Admitting: Family Medicine

## 2019-07-19 NOTE — Telephone Encounter (Signed)
Please contact pt to set up appt; then may route back to nurses. Thank you

## 2019-07-20 NOTE — Telephone Encounter (Signed)
Patient has appointment on 11/19 for 6 month followup

## 2019-07-22 ENCOUNTER — Other Ambulatory Visit: Payer: Self-pay

## 2019-07-22 ENCOUNTER — Encounter: Payer: Self-pay | Admitting: Family Medicine

## 2019-07-22 ENCOUNTER — Ambulatory Visit (INDEPENDENT_AMBULATORY_CARE_PROVIDER_SITE_OTHER): Payer: PPO | Admitting: Family Medicine

## 2019-07-22 VITALS — BP 126/54

## 2019-07-22 DIAGNOSIS — E782 Mixed hyperlipidemia: Secondary | ICD-10-CM | POA: Diagnosis not present

## 2019-07-22 DIAGNOSIS — E119 Type 2 diabetes mellitus without complications: Secondary | ICD-10-CM

## 2019-07-22 DIAGNOSIS — I10 Essential (primary) hypertension: Secondary | ICD-10-CM

## 2019-07-22 DIAGNOSIS — Z79899 Other long term (current) drug therapy: Secondary | ICD-10-CM | POA: Diagnosis not present

## 2019-07-22 MED ORDER — METOPROLOL SUCCINATE ER 25 MG PO TB24: ORAL_TABLET | ORAL | 5 refills | Status: DC

## 2019-07-22 MED ORDER — GLUCOSE BLOOD VI STRP: ORAL_STRIP | 6 refills | Status: DC

## 2019-07-22 MED ORDER — RAMIPRIL 5 MG PO CAPS: 5.0000 mg | ORAL_CAPSULE | Freq: Two times a day (BID) | ORAL | 5 refills | Status: DC

## 2019-07-22 MED ORDER — FLUVASTATIN SODIUM 40 MG PO CAPS: 40.0000 mg | ORAL_CAPSULE | Freq: Every day | ORAL | 5 refills | Status: DC

## 2019-07-22 MED ORDER — AMLODIPINE BESYLATE 2.5 MG PO TABS: 2.5000 mg | ORAL_TABLET | Freq: Every day | ORAL | 5 refills | Status: DC

## 2019-07-22 MED ORDER — ESCITALOPRAM OXALATE 20 MG PO TABS: 20.0000 mg | ORAL_TABLET | Freq: Every day | ORAL | 5 refills | Status: DC

## 2019-07-22 MED ORDER — ALPRAZOLAM 0.25 MG PO TABS: 0.2500 mg | ORAL_TABLET | Freq: Every day | ORAL | 5 refills | Status: DC | PRN

## 2019-07-22 NOTE — Progress Notes (Signed)
   Subjective:    Patient ID: [name], female    DOB: [birth year], 86 y.o.   MRN: [id number]  Diabetes She presents for her follow-up diabetic visit. She has type 2 diabetes mellitus. There are no hypoglycemic associated symptoms. There are no diabetic associated symptoms. There are no hypoglycemic complications. There are no diabetic complications. Eye exam current: Has appt first week in Janurary    Pt is able to check blood pressure regular. No issues or concerns.   Virtual Visit via Telephone Note  I connected with [name] on 07/22/19 at  2:30 PM EST by telephone and verified that I am speaking with the correct person using two identifiers.  Location: Patient: home Provider: office   I discussed the limitations, risks, security and privacy concerns of performing an evaluation and management service by telephone and the availability of in person appointments. I also discussed with the patient that there may be a patient responsible charge related to this service. The patient expressed understanding and agreed to proceed.   History of Present Illness:    Observations/Objective:   Assessment and Plan:   Follow Up Instructions:    I discussed the assessment and treatment plan with the patient. The patient was provided an opportunity to ask questions and all were answered. The patient agreed with the plan and demonstrated an understanding of the instructions.   The patient was advised to call back or seek an in-person evaluation if the symptoms worsen or if the condition fails to improve as anticipated.  I provided minutes of non-face-to-face time during this encounter. [place], LPN  Patient has history of stroke.  No recent TIA symptoms.  She is doing some walking within her house.  Reports her sugars overall are in fairly good shape  Also notes her blood pressures have been good  Has not had  blood work for quite some time  Review of Systems No headache no chest pain no shortness of breath no TIA symptomatology    Objective:   Physical Exam   Virtual    Assessment & Plan:  Impression hypertension.  Apparent good control discussed to maintain same meds compliance discussed  2.  Type 2 diabetes.  Status uncertain.  A1c encouraged.  Medications and diet maintain  3.  Hyperlipidemia.  Also need of blood work.  Compliance discussed diet discussed  4.  History of stroke no recurrent symptomatology  5.  Known coronary artery disease  Further recommendations based on results diet exercise discussed medications refilled  Greater than 50% of this 25 minute non-face to face visit was spent in counseling and discussion and coordination of care regarding the above diagnosis/diagnosies

## 2019-07-23 DIAGNOSIS — E119 Type 2 diabetes mellitus without complications: Secondary | ICD-10-CM | POA: Diagnosis not present

## 2019-07-23 DIAGNOSIS — E782 Mixed hyperlipidemia: Secondary | ICD-10-CM | POA: Diagnosis not present

## 2019-07-23 DIAGNOSIS — Z79899 Other long term (current) drug therapy: Secondary | ICD-10-CM | POA: Diagnosis not present

## 2019-07-23 DIAGNOSIS — I10 Essential (primary) hypertension: Secondary | ICD-10-CM | POA: Diagnosis not present

## 2019-07-24 LAB — BASIC METABOLIC PANEL
BUN/Creatinine Ratio: 11 — ABNORMAL LOW (ref 12–28)
BUN: 13 mg/dL (ref 8–27)
CO2: 24 mmol/L (ref 20–29)
Calcium: 9.7 mg/dL (ref 8.7–10.3)
Chloride: 103 mmol/L (ref 96–106)
Creatinine, Ser: 1.19 mg/dL — ABNORMAL HIGH (ref 0.57–1.00)
GFR calc Af Amer: 49 mL/min/{1.73_m2} — ABNORMAL LOW (ref >59)
GFR calc non Af Amer: 42 mL/min/{1.73_m2} — ABNORMAL LOW (ref >59)
Glucose: 116 mg/dL — ABNORMAL HIGH (ref 65–99)
Potassium: 4.8 mmol/L (ref 3.5–5.2)
Sodium: 143 mmol/L (ref 134–144)

## 2019-07-24 LAB — HEPATIC FUNCTION PANEL
ALT: 11 IU/L (ref 0–32)
AST: 20 IU/L (ref 0–40)
Albumin: 4.7 g/dL — ABNORMAL HIGH (ref 3.6–4.6)
Alkaline Phosphatase: 95 IU/L (ref 39–117)
Bilirubin Total: 0.5 mg/dL (ref 0.0–1.2)
Bilirubin, Direct: 0.16 mg/dL (ref 0.00–0.40)
Total Protein: 6.7 g/dL (ref 6.0–8.5)

## 2019-07-24 LAB — LIPID PANEL
Chol/HDL Ratio: 3.2 ratio (ref 0.0–4.4)
Cholesterol, Total: 155 mg/dL (ref 100–199)
HDL: 48 mg/dL (ref >39)
LDL Chol Calc (NIH): 75 mg/dL (ref 0–99)
Triglycerides: 192 mg/dL — ABNORMAL HIGH (ref 0–149)
VLDL Cholesterol Cal: 32 mg/dL (ref 5–40)

## 2019-07-24 LAB — HEMOGLOBIN A1C
Est. average glucose Bld gHb Est-mCnc: 123 mg/dL
Hgb A1c MFr Bld: 5.9 % — ABNORMAL HIGH (ref 4.8–5.6)

## 2019-07-25 ENCOUNTER — Encounter: Payer: Self-pay | Admitting: Family Medicine

## 2019-07-28 ENCOUNTER — Other Ambulatory Visit: Payer: Self-pay

## 2019-09-10 DIAGNOSIS — E113291 Type 2 diabetes mellitus with mild nonproliferative diabetic retinopathy without macular edema, right eye: Secondary | ICD-10-CM | POA: Diagnosis not present

## 2019-09-10 LAB — HM DIABETES EYE EXAM

## 2019-09-13 ENCOUNTER — Other Ambulatory Visit: Payer: Self-pay | Admitting: Cardiology

## 2019-10-07 ENCOUNTER — Encounter: Payer: Self-pay | Admitting: Family Medicine

## 2019-11-08 ENCOUNTER — Other Ambulatory Visit: Payer: Self-pay | Admitting: Cardiology

## 2019-11-26 ENCOUNTER — Encounter: Payer: Self-pay | Admitting: Cardiology

## 2019-11-26 ENCOUNTER — Ambulatory Visit (INDEPENDENT_AMBULATORY_CARE_PROVIDER_SITE_OTHER): Payer: PPO | Admitting: Cardiology

## 2019-11-26 VITALS — BP 161/66 | HR 56 | Temp 98.0°F | Ht 61.0 in | Wt 141.2 lb

## 2019-11-26 DIAGNOSIS — I25119 Atherosclerotic heart disease of native coronary artery with unspecified angina pectoris: Secondary | ICD-10-CM | POA: Diagnosis not present

## 2019-11-26 DIAGNOSIS — I6523 Occlusion and stenosis of bilateral carotid arteries: Secondary | ICD-10-CM | POA: Diagnosis not present

## 2019-11-26 DIAGNOSIS — E782 Mixed hyperlipidemia: Secondary | ICD-10-CM

## 2019-11-26 MED ORDER — NITROGLYCERIN 0.4 MG SL SUBL: SUBLINGUAL_TABLET | SUBLINGUAL | 2 refills | Status: DC

## 2019-11-26 NOTE — Patient Instructions (Signed)
Medication Instructions:   Your physician recommends that you continue on your current medications as directed. Please refer to the Current Medication list given to you today.  Labwork:  NONE  Testing/Procedures: Your physician has requested that you have a carotid duplex. This test is an ultrasound of the carotid arteries in your neck. It looks at blood flow through these arteries that supply the brain with blood. Allow one hour for this exam. There are no restrictions or special instructions.  Follow-Up:  Your physician recommends that you schedule a follow-up appointment in: 6 months (office). You will receive a reminder letter in the mail in about 4 months reminding you to call and schedule your appointment. If you don't receive this letter, please contact our office.  Any Other Special Instructions Will Be Listed Below (If Applicable).  If you need a refill on your cardiac medications before your next appointment, please call your pharmacy.

## 2019-11-26 NOTE — Progress Notes (Signed)
Cardiology Office Note  Date: 11/26/2019   ID: RIELLY NOFTZ, DOB 1934-07-02, MRN [id number]  PCP:  Mikey Kirschner, MD  Cardiologist:  Rozann Lesches, MD Electrophysiologist:  None   Chief Complaint  Patient presents with  . Cardiac follow-up    History of Present Illness: [name] is an 86 y.o. female last seen in July 2019.  She presents overdue for follow-up.  She tells me that overall she has been relatively stable in terms of functional capacity.  Still lives in her own home, does do some walking outdoors for exercise.  She reports NYHA class II dyspnea, no angina symptoms or nitroglycerin use.  I reviewed her medications today which include aspirin, Norvasc, Zetia, Lescol, Altace, Toprol-XL, and as needed nitroglycerin.  She continues to follow with Dr. Wolfgang Phoenix, blood work from November 2020 showed LDL 75, normal LFTs.  I personally reviewed her ECG today which shows sinus bradycardia.  She has not had recent follow-up carotid Dopplers.  Past Medical History:  Diagnosis Date  . Allergic rhinitis   . Cataracts, bilateral   . Coronary atherosclerosis of native coronary artery    DES LAD 3/12, 99% nondominant RCA, No CAD otherwise, LVEF 65%  . Diabetic retinopathy ([place])   . Diverticulitis   . Essential hypertension   . GERD (gastroesophageal reflux disease)   . Hemorrhoids   . Hyperlipidemia   . Stroke, hemorrhagic ([place])    Right thalamic hemorrhage 4/12  . Syncope    Neurally mediated  . Type 2 diabetes mellitus ([place])     Past Surgical History:  Procedure Laterality Date  . BREAST CYST EXCISION    . CARDIAC CATHETERIZATION    . CAROTID STENT  11/16/10  . COLONOSCOPY N/A 10/02/2014   Dr. Rourk:engorged internal hemorrhoids/pancolonic diverticulosis  . ESOPHAGOGASTRODUODENOSCOPY  10/02/14   Dr. Rourk:normal esophagus 2 cm hiatal hernia, gastric erosions likely from NSAID effect    Current Outpatient Medications  Medication Sig Dispense  Refill  . ALPRAZolam (XANAX) 0.25 MG tablet Take 1 tablet (0.25 mg total) by mouth daily as needed. 30 tablet 5  . amLODipine (NORVASC) 2.5 MG tablet Take 1 tablet (2.5 mg total) by mouth daily. 30 tablet 5  . aspirin 81 MG tablet Take 81 mg by mouth daily.      . calcium citrate-vitamin D (CITRACAL+D) 315-200 MG-UNIT per tablet Take 1 tablet by mouth every other day.     . Coenzyme Q10 150 MG CAPS Take 150 mg by mouth every other day.     . escitalopram (LEXAPRO) 20 MG tablet Take 1 tablet (20 mg total) by mouth daily. 30 tablet 5  . ezetimibe (ZETIA) 10 MG tablet TAKE ONE TABLET BY MOUTH DAILY. 30 tablet 0  . fluvastatin (LESCOL) 40 MG capsule TAKE 1 CAPSULE BY MOUTH DAILY. 30 capsule 0  . fluvastatin (LESCOL) 40 MG capsule One tablet daily 30 capsule 5  . fluvastatin (LESCOL) 40 MG capsule TAKE 1 CAPSULE BY MOUTH DAILY. 30 capsule 0  . fluvastatin (LESCOL) 40 MG capsule Take 1 capsule (40 mg total) by mouth daily. 30 capsule 5  . glucose blood (ONE TOUCH ULTRA TEST) test strip USE AS DIRECTED ONCE DAILY. 50 each 6  . metoprolol succinate (TOPROL-XL) 25 MG 24 hr tablet TAKE (1) TABLET BY MOUTH AT BEDTIME. 30 tablet 5  . Multiple Vitamins-Minerals (CENTRUM) tablet Take 1 tablet by mouth daily.     . nitroGLYCERIN (NITROSTAT) 0.4 MG SL tablet Dissolve 1 tablet under  tongue every 5 mins up to 3 dose in 15 mins for chest pain. If no relief call 911. 25 tablet 2  . Omega-3 Fatty Acids (FISH OIL) 1000 MG CAPS Take 1,000 mg by mouth daily.     . ramipril (ALTACE) 5 MG capsule Take 1 capsule (5 mg total) by mouth 2 (two) times daily. 60 capsule 5  . TURMERIC PO Take by mouth 2 (two) times daily.     No current facility-administered medications for this visit.   Allergies:  Macrodantin, Pravachol [pravastatin sodium], and Tetanus toxoids   ROS:   Hearing loss.  Arthritic stiffness.  Physical Exam: VS:  BP (!) 161/66   Pulse (!) 56   Temp 98 F (36.7 C)   Ht 5\' 1"  (1.549 m)   Wt 141 lb 3.2 oz  (64 kg)   SpO2 96%   BMI 26.68 kg/m , BMI Body mass index is 26.68 kg/m.  Wt Readings from Last 3 Encounters:  11/26/19 141 lb 3.2 oz (64 kg)  06/24/18 149 lb (67.6 kg)  03/04/18 148 lb (67.1 kg)    General: Elderly woman, appears comfortable at rest. HEENT: Conjunctiva and lids normal, wearing a mask. Neck: Supple, no elevated JVP, right carotid bruit, no thyromegaly. Lungs: Clear to auscultation, nonlabored breathing at rest. Cardiac: Regular rate and rhythm, no S3, soft systolic murmur. Abdomen: Soft, nontender, bowel sounds present. Extremities: No pitting edema, distal pulses 2+.  ECG:  An ECG dated 03/04/2018 was personally reviewed today and demonstrated:  Sinus rhythm.  Recent Labwork: 07/23/2019: ALT 11; AST 20; BUN 13; Creatinine, Ser 1.19; Potassium 4.8; Sodium 143     Component Value Date/Time   CHOL 155 07/23/2019 0925   TRIG 192 (H) 07/23/2019 0925   HDL 48 07/23/2019 0925   CHOLHDL 3.2 07/23/2019 0925   CHOLHDL 2.8 11/03/2014 0805   VLDL 35 11/03/2014 0805   LDLCALC 75 07/23/2019 0925    Other Studies Reviewed Today:  Lexiscan Cardiolite 05/16/2015:  There was no ST segment deviation noted during stress.  No significant defects to suggest scar or ischemia.  This is a low risk study.  Nuclear stress EF: 58%.  Assessment and Plan:  1.  CAD status post DES to the LAD in 2012 with nondominant RCA disease that has been managed medically.  Continue observation in the absence of progressive angina symptoms on medical therapy.  Refill provided for fresh bottle of nitroglycerin.  Otherwise continue aspirin, Norvasc, Zetia, Lescol, Toprol-XL, and Altace.  2.  Carotid artery disease, right carotid bruit noted.  Follow-up carotid Dopplers.  Continue aspirin and statin.  3.  Mixed hyperlipidemia, on Lescol and Zetia.  Last LDL 75.  Medication Adjustments/Labs and Tests Ordered: Current medicines are reviewed at length with the patient today.  Concerns regarding  medicines are outlined above.   Tests Ordered: Orders Placed This Encounter  Procedures  . US Carotid Bilateral  . EKG 12-Lead    Medication Changes: Meds ordered this encounter  Medications  . nitroGLYCERIN (NITROSTAT) 0.4 MG SL tablet    Sig: Dissolve 1 tablet under tongue every 5 mins up to 3 dose in 15 mins for chest pain. If no relief call 911.    Dispense:  25 tablet    Refill:  2    Disposition:  Follow up 6 months in the [place] office.  Signed, Satira Sark, MD, [facility] 11/26/2019 4:29 PM    [facility] [address] [place],  [place] [address] Phone: [phone]; Fax: [phone]

## 2019-12-02 ENCOUNTER — Ambulatory Visit (HOSPITAL_COMMUNITY)
Admission: RE | Admit: 2019-12-02 | Discharge: 2019-12-02 | Disposition: A | Discharge status: Home / Self Care | Payer: PPO | Source: Ambulatory Visit | Attending: Cardiology | Admitting: Cardiology

## 2019-12-02 ENCOUNTER — Other Ambulatory Visit: Payer: Self-pay

## 2019-12-02 DIAGNOSIS — I6523 Occlusion and stenosis of bilateral carotid arteries: Secondary | ICD-10-CM | POA: Insufficient documentation

## 2020-01-03 ENCOUNTER — Other Ambulatory Visit: Payer: Self-pay | Admitting: Family Medicine

## 2020-01-03 NOTE — Telephone Encounter (Signed)
Scheduled 5/13.

## 2020-01-03 NOTE — Telephone Encounter (Signed)
Contact pt , rec o v for chronic with myself or carolyn the week dr Nicki Reaper is off

## 2020-01-13 ENCOUNTER — Encounter: Payer: Self-pay | Admitting: Family Medicine

## 2020-01-13 ENCOUNTER — Other Ambulatory Visit: Payer: Self-pay

## 2020-01-13 ENCOUNTER — Ambulatory Visit (INDEPENDENT_AMBULATORY_CARE_PROVIDER_SITE_OTHER): Payer: PPO | Admitting: Family Medicine

## 2020-01-13 VITALS — BP 116/70 | HR 65 | Temp 97.8°F | Ht 61.0 in | Wt 140.0 lb

## 2020-01-13 DIAGNOSIS — Z79899 Other long term (current) drug therapy: Secondary | ICD-10-CM

## 2020-01-13 DIAGNOSIS — E782 Mixed hyperlipidemia: Secondary | ICD-10-CM

## 2020-01-13 DIAGNOSIS — E119 Type 2 diabetes mellitus without complications: Secondary | ICD-10-CM | POA: Diagnosis not present

## 2020-01-13 DIAGNOSIS — I10 Essential (primary) hypertension: Secondary | ICD-10-CM

## 2020-01-13 DIAGNOSIS — Z8673 Personal history of transient ischemic attack (TIA), and cerebral infarction without residual deficits: Secondary | ICD-10-CM | POA: Diagnosis not present

## 2020-01-13 LAB — POCT GLYCOSYLATED HEMOGLOBIN (HGB A1C): Hemoglobin A1C: 4.8 % (ref 4.0–5.6)

## 2020-01-13 MED ORDER — AMLODIPINE BESYLATE 2.5 MG PO TABS: 2.5000 mg | ORAL_TABLET | Freq: Every day | ORAL | 5 refills | Status: DC

## 2020-01-13 MED ORDER — RAMIPRIL 5 MG PO CAPS: 5.0000 mg | ORAL_CAPSULE | Freq: Two times a day (BID) | ORAL | 5 refills | Status: DC

## 2020-01-13 MED ORDER — ESCITALOPRAM OXALATE 20 MG PO TABS: 20.0000 mg | ORAL_TABLET | Freq: Every day | ORAL | 5 refills | Status: DC

## 2020-01-13 MED ORDER — METOPROLOL SUCCINATE ER 25 MG PO TB24: ORAL_TABLET | ORAL | 5 refills | Status: DC

## 2020-01-13 MED ORDER — ALPRAZOLAM 0.25 MG PO TABS: 0.2500 mg | ORAL_TABLET | Freq: Every day | ORAL | 5 refills | Status: DC | PRN

## 2020-01-13 MED ORDER — FLUVASTATIN SODIUM 40 MG PO CAPS: 40.0000 mg | ORAL_CAPSULE | Freq: Every day | ORAL | 5 refills | Status: DC

## 2020-01-13 NOTE — Progress Notes (Signed)
   Subjective:    Patient ID: [name], female    DOB: 23-Oct-1933, 86 y.o.   MRN: [id number]  Diabetes She presents for her follow-up diabetic visit. She has type 2 diabetes mellitus. When asked about current treatments, none were reported. She is compliant with treatment most of the time. Home blood sugar record trend: checks sugars every mondays. around 130. She does not see a podiatrist.Eye exam is current (due now).   Pt states no concerns today.   Results for orders placed or performed in visit on 01/13/20  POCT glycosylated hemoglobin (Hb A1C)  Result Value Ref Range   Hemoglobin A1C 4.8 4.0 - 5.6 %   HbA1c POC (<> result, manual entry)     HbA1c, POC (prediabetic range)     HbA1c, POC (controlled diabetic range)     Blood pressure medicine and blood pressure levels reviewed today with patient. Compliant with blood pressure medicine. States does not miss a dose. No obvious side effects. Blood pressure generally good when checked elsewhere. Watching salt intake.   Patient continues to take lipid medication regularly. No obvious side effects from it. Generally does not miss a dose. Prior blood work results are reviewed with patient. Patient continues to work on fat intake in diet  Patient claims compliance with diabetes medication. No obvious side effects. Reports no substantial low sugar spells. Most numbers are generally in good range when checked fasting. Generally does not miss a dose of medication. Watching diabetic diet closely  Patient compliant with insomnia medication. Generally takes most nights. No obvious morning drowsiness. Definitely helps patient sleep. Without it patient states would not get a good nights rest.  Patient has history of stroke.  No focal neurological deficits.  He is walking exercising some  Review of Systems No headache no chest pain no shortness of breath    Objective:   Physical Exam Alert and oriented, vitals reviewed and stable,  NAD ENT-TM's and ext canals WNL bilat via otoscopic exam Soft palate, tonsils and post pharynx WNL via oropharyngeal exam Neck-symmetric, no masses; thyroid nonpalpable and nontender Pulmonary-no tachypnea or accessory muscle use; Clear without wheezes via auscultation Card--no abnrml murmurs, rhythm reg and rate WNL Carotid pulses symmetric, without bruits Feet pulses good sensation intact       Assessment & Plan:  Impression 1 type 2 diabetes.  A1c low.  Diet discussed.  2.  Hypertension.  Good control discussed maintain same meds  3.  Hyperlipidemia.  Status uncertain.  Need to check blood work  4.  Status post stroke clinically stable  5.  Coronary artery disease stable  Further orders per lab s.  Medications refilled.  Diet exercise discussed.  Follow-up in 6 months

## 2020-01-17 ENCOUNTER — Other Ambulatory Visit: Payer: Self-pay | Admitting: Cardiology

## 2020-01-18 DIAGNOSIS — I10 Essential (primary) hypertension: Secondary | ICD-10-CM | POA: Diagnosis not present

## 2020-01-18 DIAGNOSIS — Z79899 Other long term (current) drug therapy: Secondary | ICD-10-CM | POA: Diagnosis not present

## 2020-01-18 DIAGNOSIS — E782 Mixed hyperlipidemia: Secondary | ICD-10-CM | POA: Diagnosis not present

## 2020-01-19 LAB — HEPATIC FUNCTION PANEL
ALT: 17 IU/L (ref 0–32)
AST: 20 IU/L (ref 0–40)
Albumin: 4.6 g/dL (ref 3.6–4.6)
Alkaline Phosphatase: 106 IU/L (ref 48–121)
Bilirubin Total: 0.5 mg/dL (ref 0.0–1.2)
Bilirubin, Direct: 0.15 mg/dL (ref 0.00–0.40)
Total Protein: 7 g/dL (ref 6.0–8.5)

## 2020-01-19 LAB — LIPID PANEL
Chol/HDL Ratio: 2.8 ratio (ref 0.0–4.4)
Cholesterol, Total: 149 mg/dL (ref 100–199)
HDL: 53 mg/dL (ref >39)
LDL Chol Calc (NIH): 66 mg/dL (ref 0–99)
Triglycerides: 181 mg/dL — ABNORMAL HIGH (ref 0–149)
VLDL Cholesterol Cal: 30 mg/dL (ref 5–40)

## 2020-01-20 ENCOUNTER — Encounter: Payer: Self-pay | Admitting: Family Medicine

## 2020-04-14 ENCOUNTER — Encounter: Payer: Self-pay | Admitting: Family Medicine

## 2020-04-14 ENCOUNTER — Ambulatory Visit (INDEPENDENT_AMBULATORY_CARE_PROVIDER_SITE_OTHER): Payer: PPO | Admitting: Family Medicine

## 2020-04-14 ENCOUNTER — Other Ambulatory Visit: Payer: Self-pay

## 2020-04-14 VITALS — BP 132/68 | HR 59 | Temp 97.8°F | Ht 61.0 in | Wt 144.0 lb

## 2020-04-14 DIAGNOSIS — M1711 Unilateral primary osteoarthritis, right knee: Secondary | ICD-10-CM | POA: Diagnosis not present

## 2020-04-14 NOTE — Patient Instructions (Addendum)
Use Icy Hot topical ointment for right knee pain. Use ice for 20 minutes every 2 hours. Do knee exercises with daughter two times pr day If you want joint injection make appointment with Dr. Nicki Reaper Let me know if you want physical therapy consult.  Use Tylenol 325 mg every 6 hours for pain (do not exceed this dose).    Knee Exercises Ask your health care provider which exercises are safe for you. Do exercises exactly as told by your health care provider and adjust them as directed. It is normal to feel mild stretching, pulling, tightness, or discomfort as you do these exercises. Stop right away if you feel sudden pain or your pain gets worse. Do not begin these exercises until told by your health care provider. Stretching and range-of-motion exercises These exercises warm up your muscles and joints and improve the movement and flexibility of your knee. These exercises also help to relieve pain and swelling. Knee extension, prone 1. Lie on your abdomen (prone position) on a bed. 2. Place your left / right knee just beyond the edge of the surface so your knee is not on the bed. You can put a towel under your left / right thigh just above your kneecap for comfort. 3. Relax your leg muscles and allow gravity to straighten your knee (extension). You should feel a stretch behind your left / right knee. 4. Hold this position for ___10-15_______ seconds. 5. Scoot up so your knee is supported between repetitions. Repeat ____3-5_____ times. Complete this exercise ____2______ times a day. Knee flexion, active  1. Lie on your back with both legs straight. If this causes back discomfort, bend your left / right knee so your foot is flat on the floor. 2. Slowly slide your left / right heel back toward your buttocks. Stop when you feel a gentle stretch in the front of your knee or thigh (flexion). 3. Hold this position for _____10-15_____ seconds. 4. Slowly slide your left / right heel back to the  starting position. Repeat ______3-5____ times. Complete this exercise ______2____ times a day. Quadriceps stretch, prone  1. Lie on your abdomen on a firm surface, such as a bed or padded floor. 2. Bend your left / right knee and hold your ankle. If you cannot reach your ankle or pant leg, loop a belt around your foot and grab the belt instead. 3. Gently pull your heel toward your buttocks. Your knee should not slide out to the side. You should feel a stretch in the front of your thigh and knee (quadriceps). 4. Hold this position for ___10-15_______ seconds. Repeat _____3-5_____ times. Complete this exercise ____2______ times a day. Hamstring, supine 1. Lie on your back (supine position). 2. Loop a belt or towel over the ball of your left / right foot. The ball of your foot is on the walking surface, right under your toes. 3. Straighten your left / right knee and slowly pull on the belt to raise your leg until you feel a gentle stretch behind your knee (hamstring). ? Do not let your knee bend while you do this. ? Keep your other leg flat on the floor. 4. Hold this position for ___10-15_______ seconds. Repeat _____3-5_____ times. Complete this exercise _____2____ times a day. Strengthening exercises These exercises build strength and endurance in your knee. Endurance is the ability to use your muscles for a long time, even after they get tired. Quadriceps, isometric This exercise stretches the muscles in front of your thigh (quadriceps) without moving  your knee joint (isometric). 1. Lie on your back with your left / right leg extended and your other knee bent. Put a rolled towel or small pillow under your knee if told by your health care provider. 2. Slowly tense the muscles in the front of your left / right thigh. You should see your kneecap slide up toward your hip or see increased dimpling just above the knee. This motion will push the back of the knee toward the floor. 3. For  ___10-15_______ seconds, hold the muscle as tight as you can without increasing your pain. 4. Relax the muscles slowly and completely. Repeat ___3-5_______ times. Complete this exercise _____2____ times a day. Straight leg raises This exercise stretches the muscles in front of your thigh (quadriceps) and the muscles that move your hips (hip flexors). 1. Lie on your back with your left / right leg extended and your other knee bent. 2. Tense the muscles in the front of your left / right thigh. You should see your kneecap slide up or see increased dimpling just above the knee. Your thigh may even shake a bit. 3. Keep these muscles tight as you raise your leg 4-6 inches (10-15 cm) off the floor. Do not let your knee bend. 4. Hold this position for ____10-15______ seconds. 5. Keep these muscles tense as you lower your leg. 6. Relax your muscles slowly and completely after each repetition. Repeat ___3-5_______ times. Complete this exercise _____2_____ times a day. Hamstring, isometric 1. Lie on your back on a firm surface. 2. Bend your left / right knee about ____90______ degrees. 3. Dig your left / right heel into the surface as if you are trying to pull it toward your buttocks. Tighten the muscles in the back of your thighs (hamstring) to "dig" as hard as you can without increasing any pain. 4. Hold this position for ___10-15_______ seconds. 5. Release the tension gradually and allow your muscles to relax completely for __________ seconds after each repetition. Repeat _____3-5_____ times. Complete this exercise ____2_____ times a day. Hamstring curls If told by your health care provider, do this exercise while wearing ankle weights. Begin with ______0___ lb weights. Then increase the weight by 1 lb (0.5 kg) increments. Do not wear ankle weights that are more than __________ lb. 1. Lie on your abdomen with your legs straight. 2. Bend your left / right knee as far as you can without feeling pain.  Keep your hips flat against the floor. 3. Hold this position for _____0____ seconds. 4. Slowly lower your leg to the starting position. Repeat ______0___ times. Complete this exercise ____0_____ times a day. Squats This exercise strengthens the muscles in front of your thigh and knee (quadriceps). 1. Stand in front of a table, with your feet and knees pointing straight ahead. You may rest your hands on the table for balance but not for support. 2. Slowly bend your knees and lower your hips like you are going to sit in a chair. ? Keep your weight over your heels, not over your toes. ? Keep your lower legs upright so they are parallel with the table legs. ? Do not let your hips go lower than your knees. ? Do not bend lower than told by your health care provider. ? If your knee pain increases, do not bend as low. 3. Hold the squat position for ____10-15______ seconds. 4. Slowly push with your legs to return to standing. Do not use your hands to pull yourself to standing. Repeat _____3-5_____ times.  Complete this exercise ______2____ times a day. Wall slides This exercise strengthens the muscles in front of your thigh and knee (quadriceps). 1. Lean your back against a smooth wall or door, and walk your feet out 18-24 inches (46-61 cm) from it. 2. Place your feet hip-width apart. 3. Slowly slide down the wall or door until your knees bend __________ degrees. Keep your knees over your heels, not over your toes. Keep your knees in line with your hips. 4. Hold this position for _____10-15_____ seconds. Repeat ____3-5_____ times. Complete this exercise ____2______ times a day. Straight leg raises This exercise strengthens the muscles that rotate the leg at the hip and move it away from your body (hip abductors). 1. Lie on your side with your left / right leg in the top position. Lie so your head, shoulder, knee, and hip line up. You may bend your bottom knee to help you keep your balance. 2. Roll  your hips slightly forward so your hips are stacked directly over each other and your left / right knee is facing forward. 3. Leading with your heel, lift your top leg 4-6 inches (10-15 cm). You should feel the muscles in your outer hip lifting. ? Do not let your foot drift forward. ? Do not let your knee roll toward the ceiling. 4. Hold this position for _____10-15_____ seconds. 5. Slowly return your leg to the starting position. 6. Let your muscles relax completely after each repetition. Repeat ____3-5______ times. Complete this exercise _____2____ times a day. Straight leg raises This exercise stretches the muscles that move your hips away from the front of the pelvis (hip extensors). 1. Lie on your abdomen on a firm surface. You can put a pillow under your hips if that is more comfortable. 2. Tense the muscles in your buttocks and lift your left / right leg about 4-6 inches (10-15 cm). Keep your knee straight as you lift your leg. 3. Hold this position for ____10-15_____ seconds. 4. Slowly lower your leg to the starting position. 5. Let your leg relax completely after each repetition. Repeat _____3-5_____ times. Complete this exercise _____2____ times a day. This information is not intended to replace advice given to you by your health care provider. Make sure you discuss any questions you have with your health care provider. Document Revised: 06/09/2018 Document Reviewed: 06/09/2018 Elsevier Patient Education  2020 [employer].

## 2020-04-14 NOTE — Progress Notes (Signed)
Patient ID: [name], female    DOB: 02-Nov-1933, 86 y.o.   MRN: [id number]   Chief Complaint  Patient presents with  . Knee Pain   Subjective:    HPI  pt arrives with daughter [name]. Right knee pain for about one week. Tried tylenol.    [name] has a past medical history of Allergic rhinitis, Cataracts, bilateral, Coronary atherosclerosis of native coronary artery, Diabetic retinopathy ([place]), Diverticulitis, Essential hypertension, GERD (gastroesophageal reflux disease), Hemorrhoids, Hyperlipidemia, Stroke, hemorrhagic ([place]), Syncope, and Type 2 diabetes mellitus ([place]).   Outpatient Encounter Medications as of 04/14/2020  Medication Sig  . ALPRAZolam (XANAX) 0.25 MG tablet Take 1 tablet (0.25 mg total) by mouth daily as needed.  Marland Kitchen amLODipine (NORVASC) 2.5 MG tablet Take 1 tablet (2.5 mg total) by mouth daily.  Marland Kitchen aspirin 81 MG tablet Take 81 mg by mouth daily.    . calcium citrate-vitamin D (CITRACAL+D) 315-200 MG-UNIT per tablet Take 1 tablet by mouth every other day.   . Coenzyme Q10 150 MG CAPS Take 150 mg by mouth every other day.   . escitalopram (LEXAPRO) 20 MG tablet Take 1 tablet (20 mg total) by mouth daily.  Marland Kitchen ezetimibe (ZETIA) 10 MG tablet TAKE ONE TABLET BY MOUTH DAILY.  . fluvastatin (LESCOL) 40 MG capsule Take 1 capsule (40 mg total) by mouth daily.  Marland Kitchen glucose blood (ONE TOUCH ULTRA TEST) test strip USE AS DIRECTED ONCE DAILY.  . metoprolol succinate (TOPROL-XL) 25 MG 24 hr tablet TAKE (1) TABLET BY MOUTH AT BEDTIME.  . Multiple Vitamins-Minerals (CENTRUM) tablet Take 1 tablet by mouth daily.   . nitroGLYCERIN (NITROSTAT) 0.4 MG SL tablet Dissolve 1 tablet under tongue every 5 mins up to 3 dose in 15 mins for chest pain. If no relief call 911.  . Omega-3 Fatty Acids (FISH OIL) 1000 MG CAPS Take 1,000 mg by mouth daily.   . ramipril (ALTACE) 5 MG capsule Take 1 capsule (5 mg total) by mouth 2 (two) times daily.  . TURMERIC PO Take by  mouth 2 (two) times daily.   No facility-administered encounter medications on file as of 04/14/2020.     Review of Systems  Constitutional: Negative for chills and fever.  HENT: Negative.   Eyes: Negative.   Cardiovascular: Negative.   Gastrointestinal: Negative.   Musculoskeletal: Positive for gait problem and joint swelling.       Right knee with increased swelling on lateral aspect of knee.   Skin: Negative.      Vitals BP 132/68   Pulse (!) 59   Temp 97.8 F (36.6 C)   Ht 5\' 1"  (1.549 m)   Wt 144 lb (65.3 kg)   SpO2 98%   BMI 27.21 kg/m   Objective:   Physical Exam Constitutional:      Appearance: Normal appearance.  Pulmonary:     Effort: Pulmonary effort is normal.  Musculoskeletal:        General: Swelling and tenderness present.     Right lower leg: No edema.     Left lower leg: No edema.     Comments: Right knee with osteoarthritis per history. No erythema or warmness noted to touch, pain with activity only. No pain at rest. Has had joint injection in past.   Skin:    General: Skin is warm and dry.  Neurological:     Mental Status: She is alert and oriented to person, place, and time.  Psychiatric:  Mood and Affect: Mood normal.      Assessment and Plan   1. Osteoarthritis of right knee, unspecified osteoarthritis type   [name] presents today with her daughter with c/o of right knee pain with activity. Has tried Tylenol and Voltaren with minimal relief. Reports this is an ongoing problem for which she has gotten a joint injection for in the past. patient agrees to treat conservatively today.  1) Use Icy Hot OTC (or equivalent) topically for pain relief.  2) use ice for 20 minutes at a time every 2 hours when awake 3) Perform knee stretches and exercises 2 times per day with daughter (informaiton given) 4) If this is not effective, schedule an appointment for joint injection if desired, or 5) We can make a referral for physical  therapy.  Patient and daughter agree with plan of care and will let us know if she wants to pursue additional care for this ongoing chronic problem. Instructed to use Tylenol sparingly as this patient is not a good candidate for NSAIDS due to history of stroke. Encouraged patient to use a walker or cane for safety at home to prevent falls.    Pecolia Ades, NP  04/14/2020

## 2020-04-17 ENCOUNTER — Encounter: Payer: Self-pay | Admitting: Family Medicine

## 2020-04-17 ENCOUNTER — Other Ambulatory Visit: Payer: Self-pay

## 2020-04-17 ENCOUNTER — Ambulatory Visit (INDEPENDENT_AMBULATORY_CARE_PROVIDER_SITE_OTHER): Payer: PPO | Admitting: Family Medicine

## 2020-04-17 VITALS — BP 128/84 | HR 71 | Temp 97.0°F | Ht 61.0 in | Wt 144.0 lb

## 2020-04-17 DIAGNOSIS — M1711 Unilateral primary osteoarthritis, right knee: Secondary | ICD-10-CM | POA: Diagnosis not present

## 2020-04-17 DIAGNOSIS — M129 Arthropathy, unspecified: Secondary | ICD-10-CM

## 2020-04-17 DIAGNOSIS — M25561 Pain in right knee: Secondary | ICD-10-CM

## 2020-04-17 NOTE — Progress Notes (Signed)
Patient ID: [name], female    DOB: 03/14/1934, 86 y.o.   MRN: [id number]   Chief Complaint  Patient presents with  . right knee pain    swelling is worse- seen 04/14/20   Subjective:    HPI  Pt seen last week on 04/14/20 by NP for rt knee pain and not improving in pain. No new fall. No inc walking, no injury.  Swelling in the knee. No redness.   Pain in medial knee. Has had injection in past, years ago. Was using tylenol and voltaren gel and putting ice on it.  Feels that she needs an injection to the knee again. Not seen ortho in past for this.  Previous pcp would give her steroid injection to rt knee.   Paoli has a past medical history of Allergic rhinitis, Cataracts, bilateral, Coronary atherosclerosis of native coronary artery, Diabetic retinopathy ([place]), Diverticulitis, Essential hypertension, GERD (gastroesophageal reflux disease), Hemorrhoids, Hyperlipidemia, Stroke, hemorrhagic ([place]), Syncope, and Type 2 diabetes mellitus ([place]).   Outpatient Encounter Medications as of 04/17/2020  Medication Sig  . ALPRAZolam (XANAX) 0.25 MG tablet Take 1 tablet (0.25 mg total) by mouth daily as needed.  Marland Kitchen amLODipine (NORVASC) 2.5 MG tablet Take 1 tablet (2.5 mg total) by mouth daily.  Marland Kitchen aspirin 81 MG tablet Take 81 mg by mouth daily.    . calcium citrate-vitamin D (CITRACAL+D) 315-200 MG-UNIT per tablet Take 1 tablet by mouth every other day.   . Coenzyme Q10 150 MG CAPS Take 150 mg by mouth every other day.   . escitalopram (LEXAPRO) 20 MG tablet Take 1 tablet (20 mg total) by mouth daily.  Marland Kitchen ezetimibe (ZETIA) 10 MG tablet TAKE ONE TABLET BY MOUTH DAILY.  . fluvastatin (LESCOL) 40 MG capsule Take 1 capsule (40 mg total) by mouth daily.  Marland Kitchen glucose blood (ONE TOUCH ULTRA TEST) test strip USE AS DIRECTED ONCE DAILY.  . metoprolol succinate (TOPROL-XL) 25 MG 24 hr tablet TAKE (1) TABLET BY MOUTH AT BEDTIME.  . Multiple Vitamins-Minerals (CENTRUM) tablet Take  1 tablet by mouth daily.   . nitroGLYCERIN (NITROSTAT) 0.4 MG SL tablet Dissolve 1 tablet under tongue every 5 mins up to 3 dose in 15 mins for chest pain. If no relief call 911.  . Omega-3 Fatty Acids (FISH OIL) 1000 MG CAPS Take 1,000 mg by mouth daily.   . ramipril (ALTACE) 5 MG capsule Take 1 capsule (5 mg total) by mouth 2 (two) times daily.  . TURMERIC PO Take by mouth 2 (two) times daily.   No facility-administered encounter medications on file as of 04/17/2020.     Review of Systems  Constitutional: Negative for chills and fever.  Genitourinary: Negative for dysuria and frequency.  Musculoskeletal: Positive for arthralgias (rt knee pain). Negative for back pain.  Skin: Negative for rash and wound.  Neurological: Negative for weakness and numbness.     Vitals BP 128/84   Pulse 71   Temp (!) 97 F (36.1 C) (Oral)   Ht 5\' 1"  (1.549 m)   Wt 144 lb (65.3 kg)   SpO2 97%   BMI 27.21 kg/m   Objective:   Physical Exam Vitals and nursing note reviewed.  Constitutional:      General: She is not in acute distress.    Appearance: Normal appearance. She is not ill-appearing.  Musculoskeletal:     Right lower leg: No edema.     Left lower leg: No edema.     Comments: +  ttp over medial knee.  Mild swelling in rt knee.  No erythema or warmth.  Dec rom with flexion due to pain.   Skin:    General: Skin is warm and dry.     Findings: No rash.  Neurological:     General: No focal deficit present.     Mental Status: She is alert.      Assessment and Plan   1. Primary osteoarthritis of right knee  - gave 40mg  depo medrol and 2cc of 1% lidocaine. Total of 3 cc. -pre-anesthetic with 1cc of 1%lido with epi. Rt knee was cleaned and draped in sterile fashion. Pt tolerated procedure well.  Advised to f/u in the next 1-2 weeks if not improving.  Cont tylenol and ice the knee 96mins 3x per day for next few days. Pt in agreement.  F/u prn.

## 2020-07-10 ENCOUNTER — Telehealth: Payer: Self-pay

## 2020-07-10 ENCOUNTER — Other Ambulatory Visit: Payer: Self-pay | Admitting: *Deleted

## 2020-07-10 MED ORDER — METOPROLOL SUCCINATE ER 25 MG PO TB24: ORAL_TABLET | ORAL | 0 refills | Status: DC

## 2020-07-10 NOTE — Telephone Encounter (Signed)
Patient has been out of metoprolol succinate (TOPROL-XL) 25 MG 24 hr tablet  For a couple of days.  Patient said the pharmacy said they have been sending request since last week but I couldn't find anything in chart.  Patient wanted to know if we could refill this today and will make a 6 month f/u appt this month when appt is available.    [employer].

## 2020-07-10 NOTE — Telephone Encounter (Signed)
Patient aware and scheduled follow up office visit.

## 2020-07-10 NOTE — Telephone Encounter (Signed)
One month supply sent to pharm. Left message to return call.

## 2020-07-21 ENCOUNTER — Other Ambulatory Visit: Payer: Self-pay | Admitting: Family Medicine

## 2020-07-25 ENCOUNTER — Other Ambulatory Visit: Payer: Self-pay

## 2020-07-25 ENCOUNTER — Ambulatory Visit (INDEPENDENT_AMBULATORY_CARE_PROVIDER_SITE_OTHER): Payer: PPO | Admitting: Family Medicine

## 2020-07-25 ENCOUNTER — Encounter: Payer: Self-pay | Admitting: Family Medicine

## 2020-07-25 VITALS — BP 114/68 | HR 66 | Temp 97.4°F | Ht 61.0 in | Wt 145.0 lb

## 2020-07-25 DIAGNOSIS — I10 Essential (primary) hypertension: Secondary | ICD-10-CM

## 2020-07-25 DIAGNOSIS — M25561 Pain in right knee: Secondary | ICD-10-CM

## 2020-07-25 DIAGNOSIS — E119 Type 2 diabetes mellitus without complications: Secondary | ICD-10-CM

## 2020-07-25 DIAGNOSIS — F32A Depression, unspecified: Secondary | ICD-10-CM | POA: Diagnosis not present

## 2020-07-25 DIAGNOSIS — R413 Other amnesia: Secondary | ICD-10-CM | POA: Diagnosis not present

## 2020-07-25 DIAGNOSIS — R5383 Other fatigue: Secondary | ICD-10-CM | POA: Diagnosis not present

## 2020-07-25 DIAGNOSIS — G8929 Other chronic pain: Secondary | ICD-10-CM | POA: Diagnosis not present

## 2020-07-25 MED ORDER — AMLODIPINE BESYLATE 2.5 MG PO TABS
2.5000 mg | ORAL_TABLET | Freq: Every day | ORAL | 5 refills | Status: DC
Start: 2020-07-25 — End: 2020-08-18

## 2020-07-25 MED ORDER — RAMIPRIL 5 MG PO CAPS
5.0000 mg | ORAL_CAPSULE | Freq: Two times a day (BID) | ORAL | 5 refills | Status: DC
Start: 2020-07-25 — End: 2020-08-03

## 2020-07-25 MED ORDER — FLUVASTATIN SODIUM 40 MG PO CAPS
40.0000 mg | ORAL_CAPSULE | Freq: Every day | ORAL | 5 refills | Status: DC
Start: 2020-07-25 — End: 2020-08-03

## 2020-07-25 MED ORDER — TRAMADOL HCL 50 MG PO TABS: 50.0000 mg | ORAL_TABLET | Freq: Three times a day (TID) | ORAL | 0 refills | Status: AC | PRN

## 2020-07-25 MED ORDER — ESCITALOPRAM OXALATE 20 MG PO TABS: 30.0000 mg | ORAL_TABLET | Freq: Every day | ORAL | 2 refills | Status: DC

## 2020-07-25 MED ORDER — METOPROLOL SUCCINATE ER 25 MG PO TB24
ORAL_TABLET | ORAL | 1 refills | Status: DC
Start: 2020-07-25 — End: 2020-08-03

## 2020-07-25 NOTE — Progress Notes (Signed)
Patient ID: [name], female    DOB: 02-26-1934, 86 y.o.   MRN: [id number]   Chief Complaint  Patient presents with  . med check   Subjective:    HPI  Pt seen today with son. F/u on Htn, hld, anxiety and depression and med check up. Needs refills on all meds. Has been out of fluvastatin for a few days. Would like to discuss if she can stop any of these meds.   Right knee pain for a few months. Had injection by [place] in office on her last visit. Taking tylenol unknown dose 1 in am and 1 in pm and voltaren gel.  Had injection by me in 8/21. Still hurting on rt knee.  Had about 1 day of relief after the rt knee injection in 8/21.  Son is filling up pill case.  Some times forgetting lunch time medications. Pt living with husband alone.  Husband helps but they both are having memory concerns.  No safety issues per son, they aren't using stove and husband drives. Pt not driving.  However pt is able to give herself her pills, son reporting she is missing some lunch time meds.  Son noticing she is more fatigued and staying in bed. One day in bed for 18 hrs. Laying in bed and just resting, but not asleep, not always watching tv.  Dosing off during the day.  Taking prevagen - and wanting to see if something to help with her memory. Used to do more word finding.   [name] has a past medical history of Allergic rhinitis, Cataracts, bilateral, Coronary atherosclerosis of native coronary artery, Diabetic retinopathy ([name]), Diverticulitis, Essential hypertension, GERD (gastroesophageal reflux disease), Hemorrhoids, Hyperlipidemia, Stroke, hemorrhagic ([name]), Syncope, and Type 2 diabetes mellitus ([name]).   Outpatient Encounter Medications as of 07/25/2020  Medication Sig  . ALPRAZolam (XANAX) 0.25 MG tablet Take 1 tablet (0.25 mg total) by mouth daily as needed.  Marland Kitchen amLODipine (NORVASC) 2.5 MG tablet Take 1 tablet (2.5 mg total) by mouth daily.  Marland Kitchen aspirin 81 MG tablet  Take 81 mg by mouth daily.    Marland Kitchen escitalopram (LEXAPRO) 20 MG tablet Take 1.5 tablets (30 mg total) by mouth daily.  Marland Kitchen ezetimibe (ZETIA) 10 MG tablet TAKE ONE TABLET BY MOUTH DAILY.  . fluvastatin (LESCOL) 40 MG capsule Take 1 capsule (40 mg total) by mouth daily.  Marland Kitchen glucose blood (ONE TOUCH ULTRA TEST) test strip USE AS DIRECTED ONCE DAILY.  . metoprolol succinate (TOPROL-XL) 25 MG 24 hr tablet TAKE (1) TABLET BY MOUTH AT BEDTIME. NEEDS OFFICE VISIT  . nitroGLYCERIN (NITROSTAT) 0.4 MG SL tablet Dissolve 1 tablet under tongue every 5 mins up to 3 dose in 15 mins for chest pain. If no relief call 911.  . ramipril (ALTACE) 5 MG capsule Take 1 capsule (5 mg total) by mouth 2 (two) times daily.  . [DISCONTINUED] amLODipine (NORVASC) 2.5 MG tablet Take 1 tablet (2.5 mg total) by mouth daily.  . [DISCONTINUED] calcium citrate-vitamin D (CITRACAL+D) 315-200 MG-UNIT per tablet Take 1 tablet by mouth every other day.   . [DISCONTINUED] escitalopram (LEXAPRO) 20 MG tablet Take 1 tablet (20 mg total) by mouth daily.  . [DISCONTINUED] fluvastatin (LESCOL) 40 MG capsule Take 1 capsule (40 mg total) by mouth daily.  . [DISCONTINUED] metoprolol succinate (TOPROL-XL) 25 MG 24 hr tablet TAKE (1) TABLET BY MOUTH AT BEDTIME. NEEDS OFFICE VISIT  . [DISCONTINUED] Multiple Vitamins-Minerals (CENTRUM) tablet Take 1 tablet by mouth daily.   . [  DISCONTINUED] Omega-3 Fatty Acids (FISH OIL) 1000 MG CAPS Take 1,000 mg by mouth daily.   . [DISCONTINUED] ramipril (ALTACE) 5 MG capsule Take 1 capsule (5 mg total) by mouth 2 (two) times daily.  . [DISCONTINUED] TURMERIC PO Take by mouth 2 (two) times daily.  . traMADol (ULTRAM) 50 MG tablet Take 1 tablet (50 mg total) by mouth every 8 (eight) hours as needed for up to 5 days.  . [DISCONTINUED] Coenzyme Q10 150 MG CAPS Take 150 mg by mouth every other day.    No facility-administered encounter medications on file as of 07/25/2020.     Review of Systems  Constitutional:  Negative for chills and fever.  HENT: Negative for congestion, rhinorrhea and sore throat.   Respiratory: Negative for cough, shortness of breath and wheezing.   Cardiovascular: Negative for chest pain and leg swelling.  Gastrointestinal: Negative for abdominal pain, diarrhea, nausea and vomiting.  Genitourinary: Negative for dysuria and frequency.  Musculoskeletal: Negative for arthralgias (rt knee pain, chronic) and back pain.  Skin: Negative for rash.  Neurological: Negative for dizziness, weakness and headaches.       +dec memory  Psychiatric/Behavioral: Positive for dysphoric mood. Negative for agitation, behavioral problems, confusion, sleep disturbance and suicidal ideas. The patient is not nervous/anxious.        +memory deficit    Vitals BP 114/68   Pulse 66   Temp (!) 97.4 F (36.3 C)   Ht _0  (1.549 m)   Wt 145 lb (65.8 kg)   SpO2 97%   BMI 27.40 kg/m   Objective:   Physical Exam Vitals and nursing note reviewed.  Constitutional:      General: She is not in acute distress.    Appearance: Normal appearance. She is not ill-appearing.  HENT:     Head: Normocephalic and atraumatic.     Nose: Nose normal.     Mouth/Throat:     Mouth: Mucous membranes are moist.     Pharynx: Oropharynx is clear.  Eyes:     Extraocular Movements: Extraocular movements intact.     Conjunctiva/sclera: Conjunctivae normal.     Pupils: Pupils are equal, round, and reactive to light.  Cardiovascular:     Rate and Rhythm: Normal rate and regular rhythm.     Pulses: Normal pulses.     Heart sounds: Normal heart sounds. No murmur heard.   Pulmonary:     Effort: Pulmonary effort is normal.     Breath sounds: Normal breath sounds. No wheezing, rhonchi or rales.  Musculoskeletal:        General: Swelling (rt knee, ttp over medial aspect. ) and tenderness (medial rt knee) present. No deformity or signs of injury. Normal range of motion.     Right lower leg: No edema.     Left lower leg:  No edema.  Skin:    General: Skin is warm and dry.     Findings: No bruising, erythema, lesion or rash.  Neurological:     General: No focal deficit present.     Mental Status: She is alert and oriented to person, place, and time.  Psychiatric:        Mood and Affect: Mood normal.        Behavior: Behavior normal.    Assessment and Plan   1. Type 2 diabetes mellitus without complication, unspecified whether long term insulin use ([facility]) - CBC - CMP14+EGFR - Lipid panel - Hemoglobin A1c  2. Essential hypertension, benign - CBC -  CMP14+EGFR - Lipid panel  3. Other fatigue - CBC - TSH  4. Chronic pain of right knee - traMADol (ULTRAM) 50 MG tablet; Take 1 tablet (50 mg total) by mouth every 8 (eight) hours as needed for up to 5 days.  Dispense: 20 tablet; Refill: 0  5. Depression, unspecified depression type - escitalopram (LEXAPRO) 20 MG tablet; Take 1.5 tablets (30 mg total) by mouth daily.  Dispense: 45 tablet; Refill: 2  6. Memory deficit   Rt knee pain chronic- started tramadol for knee pain, rest and elevate the leg.  Pt declining to see ortho at this time.   Depression- Inc lexapro from 43m to 365m  Son in agreement with handling the pill cutting and setting up her pill case.  Lab ordered and to evalaute further on depression and fagitue. Refilled meds.   Son wanting pt assessed for memory and if any medications could help with memory.  Advising to f/u for full exam and review of MMSE. Son in agreement.  F/u for mmse/memory, and rt knee pain.

## 2020-07-26 LAB — CMP14+EGFR
ALT: 13 IU/L (ref 0–32)
AST: 17 IU/L (ref 0–40)
Albumin/Globulin Ratio: 1.7 (ref 1.2–2.2)
Albumin: 4.3 g/dL (ref 3.6–4.6)
Alkaline Phosphatase: 100 IU/L (ref 44–121)
BUN/Creatinine Ratio: 16 (ref 12–28)
BUN: 15 mg/dL (ref 8–27)
Bilirubin Total: 0.4 mg/dL (ref 0.0–1.2)
CO2: 26 mmol/L (ref 20–29)
Calcium: 9.8 mg/dL (ref 8.7–10.3)
Chloride: 98 mmol/L (ref 96–106)
Creatinine, Ser: 0.96 mg/dL (ref 0.57–1.00)
GFR calc Af Amer: 63 mL/min/{1.73_m2} (ref >59)
GFR calc non Af Amer: 54 mL/min/{1.73_m2} — ABNORMAL LOW (ref >59)
Globulin, Total: 2.6 g/dL (ref 1.5–4.5)
Glucose: 93 mg/dL (ref 65–99)
Potassium: 4.6 mmol/L (ref 3.5–5.2)
Sodium: 137 mmol/L (ref 134–144)
Total Protein: 6.9 g/dL (ref 6.0–8.5)

## 2020-07-26 LAB — LIPID PANEL
Chol/HDL Ratio: 3.9 ratio (ref 0.0–4.4)
Cholesterol, Total: 179 mg/dL (ref 100–199)
HDL: 46 mg/dL (ref >39)
LDL Chol Calc (NIH): 95 mg/dL (ref 0–99)
Triglycerides: 225 mg/dL — ABNORMAL HIGH (ref 0–149)
VLDL Cholesterol Cal: 38 mg/dL (ref 5–40)

## 2020-07-26 LAB — CBC
Hematocrit: 40.7 % (ref 34.0–46.6)
Hemoglobin: 13.5 g/dL (ref 11.1–15.9)
MCH: 30.5 pg (ref 26.6–33.0)
MCHC: 33.2 g/dL (ref 31.5–35.7)
MCV: 92 fL (ref 79–97)
Platelets: 320 10*3/uL (ref 150–450)
RBC: 4.42 x10E6/uL (ref 3.77–5.28)
RDW: 11.6 % — ABNORMAL LOW (ref 11.7–15.4)
WBC: 6.3 10*3/uL (ref 3.4–10.8)

## 2020-07-26 LAB — TSH: TSH: 1.89 u[IU]/mL (ref 0.450–4.500)

## 2020-07-26 LAB — HEMOGLOBIN A1C
Est. average glucose Bld gHb Est-mCnc: 123 mg/dL
Hgb A1c MFr Bld: 5.9 % — ABNORMAL HIGH (ref 4.8–5.6)

## 2020-07-31 ENCOUNTER — Emergency Department (HOSPITAL_COMMUNITY): Payer: PPO

## 2020-07-31 ENCOUNTER — Encounter (HOSPITAL_COMMUNITY): Payer: Self-pay

## 2020-07-31 ENCOUNTER — Observation Stay (HOSPITAL_COMMUNITY): Payer: PPO

## 2020-07-31 ENCOUNTER — Other Ambulatory Visit: Payer: Self-pay

## 2020-07-31 ENCOUNTER — Inpatient Hospital Stay (HOSPITAL_COMMUNITY)
Admission: EM | Admit: 2020-07-31 | Discharge: 2020-08-03 | DRG: 884 | Disposition: A | Discharge status: Skilled Nursing Facility | Payer: PPO | Attending: Family Medicine | Admitting: Family Medicine

## 2020-07-31 DIAGNOSIS — K219 Gastro-esophageal reflux disease without esophagitis: Secondary | ICD-10-CM | POA: Diagnosis not present

## 2020-07-31 DIAGNOSIS — I2510 Atherosclerotic heart disease of native coronary artery without angina pectoris: Secondary | ICD-10-CM | POA: Diagnosis present

## 2020-07-31 DIAGNOSIS — Z8249 Family history of ischemic heart disease and other diseases of the circulatory system: Secondary | ICD-10-CM | POA: Diagnosis not present

## 2020-07-31 DIAGNOSIS — M25562 Pain in left knee: Secondary | ICD-10-CM | POA: Diagnosis present

## 2020-07-31 DIAGNOSIS — Z9181 History of falling: Secondary | ICD-10-CM

## 2020-07-31 DIAGNOSIS — I639 Cerebral infarction, unspecified: Secondary | ICD-10-CM | POA: Diagnosis present

## 2020-07-31 DIAGNOSIS — Z20822 Contact with and (suspected) exposure to covid-19: Secondary | ICD-10-CM | POA: Diagnosis present

## 2020-07-31 DIAGNOSIS — M6281 Muscle weakness (generalized): Secondary | ICD-10-CM | POA: Diagnosis not present

## 2020-07-31 DIAGNOSIS — Z7982 Long term (current) use of aspirin: Secondary | ICD-10-CM

## 2020-07-31 DIAGNOSIS — E785 Hyperlipidemia, unspecified: Secondary | ICD-10-CM | POA: Diagnosis not present

## 2020-07-31 DIAGNOSIS — Z741 Need for assistance with personal care: Secondary | ICD-10-CM | POA: Diagnosis not present

## 2020-07-31 DIAGNOSIS — H269 Unspecified cataract: Secondary | ICD-10-CM | POA: Diagnosis not present

## 2020-07-31 DIAGNOSIS — E871 Hypo-osmolality and hyponatremia: Secondary | ICD-10-CM | POA: Diagnosis present

## 2020-07-31 DIAGNOSIS — Z8711 Personal history of peptic ulcer disease: Secondary | ICD-10-CM

## 2020-07-31 DIAGNOSIS — F0390 Unspecified dementia without behavioral disturbance: Principal | ICD-10-CM | POA: Diagnosis present

## 2020-07-31 DIAGNOSIS — Z833 Family history of diabetes mellitus: Secondary | ICD-10-CM

## 2020-07-31 DIAGNOSIS — F329 Major depressive disorder, single episode, unspecified: Secondary | ICD-10-CM | POA: Diagnosis not present

## 2020-07-31 DIAGNOSIS — G9341 Metabolic encephalopathy: Secondary | ICD-10-CM | POA: Diagnosis present

## 2020-07-31 DIAGNOSIS — R4182 Altered mental status, unspecified: Secondary | ICD-10-CM | POA: Diagnosis not present

## 2020-07-31 DIAGNOSIS — Z87891 Personal history of nicotine dependence: Secondary | ICD-10-CM

## 2020-07-31 DIAGNOSIS — R262 Difficulty in walking, not elsewhere classified: Secondary | ICD-10-CM | POA: Diagnosis not present

## 2020-07-31 DIAGNOSIS — I69398 Other sequelae of cerebral infarction: Secondary | ICD-10-CM

## 2020-07-31 DIAGNOSIS — N390 Urinary tract infection, site not specified: Secondary | ICD-10-CM | POA: Diagnosis present

## 2020-07-31 DIAGNOSIS — M4312 Spondylolisthesis, cervical region: Secondary | ICD-10-CM | POA: Diagnosis not present

## 2020-07-31 DIAGNOSIS — R0902 Hypoxemia: Secondary | ICD-10-CM | POA: Diagnosis not present

## 2020-07-31 DIAGNOSIS — R410 Disorientation, unspecified: Secondary | ICD-10-CM

## 2020-07-31 DIAGNOSIS — I10 Essential (primary) hypertension: Secondary | ICD-10-CM | POA: Diagnosis present

## 2020-07-31 DIAGNOSIS — F419 Anxiety disorder, unspecified: Secondary | ICD-10-CM | POA: Diagnosis not present

## 2020-07-31 DIAGNOSIS — R41841 Cognitive communication deficit: Secondary | ICD-10-CM | POA: Diagnosis not present

## 2020-07-31 DIAGNOSIS — G319 Degenerative disease of nervous system, unspecified: Secondary | ICD-10-CM | POA: Diagnosis not present

## 2020-07-31 DIAGNOSIS — E1149 Type 2 diabetes mellitus with other diabetic neurological complication: Secondary | ICD-10-CM

## 2020-07-31 DIAGNOSIS — E11319 Type 2 diabetes mellitus with unspecified diabetic retinopathy without macular edema: Secondary | ICD-10-CM | POA: Diagnosis not present

## 2020-07-31 DIAGNOSIS — E782 Mixed hyperlipidemia: Secondary | ICD-10-CM | POA: Diagnosis present

## 2020-07-31 DIAGNOSIS — I618 Other nontraumatic intracerebral hemorrhage: Secondary | ICD-10-CM | POA: Diagnosis not present

## 2020-07-31 DIAGNOSIS — S199XXA Unspecified injury of neck, initial encounter: Secondary | ICD-10-CM | POA: Diagnosis not present

## 2020-07-31 DIAGNOSIS — M25561 Pain in right knee: Secondary | ICD-10-CM | POA: Diagnosis not present

## 2020-07-31 DIAGNOSIS — R55 Syncope and collapse: Secondary | ICD-10-CM | POA: Diagnosis not present

## 2020-07-31 DIAGNOSIS — Z79899 Other long term (current) drug therapy: Secondary | ICD-10-CM | POA: Diagnosis not present

## 2020-07-31 DIAGNOSIS — Z800 Family history of malignant neoplasm of digestive organs: Secondary | ICD-10-CM | POA: Diagnosis not present

## 2020-07-31 DIAGNOSIS — Z803 Family history of malignant neoplasm of breast: Secondary | ICD-10-CM

## 2020-07-31 DIAGNOSIS — R531 Weakness: Secondary | ICD-10-CM

## 2020-07-31 DIAGNOSIS — E119 Type 2 diabetes mellitus without complications: Secondary | ICD-10-CM

## 2020-07-31 DIAGNOSIS — M2578 Osteophyte, vertebrae: Secondary | ICD-10-CM | POA: Diagnosis not present

## 2020-07-31 DIAGNOSIS — M4802 Spinal stenosis, cervical region: Secondary | ICD-10-CM | POA: Diagnosis not present

## 2020-07-31 DIAGNOSIS — R404 Transient alteration of awareness: Secondary | ICD-10-CM | POA: Diagnosis not present

## 2020-07-31 DIAGNOSIS — R296 Repeated falls: Secondary | ICD-10-CM | POA: Diagnosis present

## 2020-07-31 DIAGNOSIS — G9389 Other specified disorders of brain: Secondary | ICD-10-CM | POA: Diagnosis present

## 2020-07-31 DIAGNOSIS — F32A Depression, unspecified: Secondary | ICD-10-CM | POA: Diagnosis not present

## 2020-07-31 DIAGNOSIS — R2689 Other abnormalities of gait and mobility: Secondary | ICD-10-CM | POA: Diagnosis not present

## 2020-07-31 LAB — CBC WITH DIFFERENTIAL/PLATELET
Abs Immature Granulocytes: 0.01 10*3/uL (ref 0.00–0.07)
Basophils Absolute: 0.1 10*3/uL (ref 0.0–0.1)
Basophils Relative: 1 %
Eosinophils Absolute: 0.2 10*3/uL (ref 0.0–0.5)
Eosinophils Relative: 3 %
HCT: 35.9 % — ABNORMAL LOW (ref 36.0–46.0)
Hemoglobin: 11.8 g/dL — ABNORMAL LOW (ref 12.0–15.0)
Immature Granulocytes: 0 %
Lymphocytes Relative: 15 %
Lymphs Abs: 1.2 10*3/uL (ref 0.7–4.0)
MCH: 31.1 pg (ref 26.0–34.0)
MCHC: 32.9 g/dL (ref 30.0–36.0)
MCV: 94.7 fL (ref 80.0–100.0)
Monocytes Absolute: 0.7 10*3/uL (ref 0.1–1.0)
Monocytes Relative: 9 %
Neutro Abs: 5.5 10*3/uL (ref 1.7–7.7)
Neutrophils Relative %: 72 %
Platelets: 256 10*3/uL (ref 150–400)
RBC: 3.79 MIL/uL — ABNORMAL LOW (ref 3.87–5.11)
RDW: 11.7 % (ref 11.5–15.5)
WBC: 7.7 10*3/uL (ref 4.0–10.5)
nRBC: 0 % (ref 0.0–0.2)

## 2020-07-31 LAB — URINALYSIS, ROUTINE W REFLEX MICROSCOPIC
Bilirubin Urine: NEGATIVE
Glucose, UA: NEGATIVE mg/dL
Ketones, ur: NEGATIVE mg/dL
Leukocytes,Ua: NEGATIVE
Nitrite: NEGATIVE
Protein, ur: NEGATIVE mg/dL
Specific Gravity, Urine: 1.02 (ref 1.005–1.030)
pH: 5 (ref 5.0–8.0)

## 2020-07-31 LAB — COMPREHENSIVE METABOLIC PANEL
ALT: 13 U/L (ref 0–44)
AST: 18 U/L (ref 15–41)
Albumin: 3.6 g/dL (ref 3.5–5.0)
Alkaline Phosphatase: 70 U/L (ref 38–126)
Anion gap: 9 (ref 5–15)
BUN: 17 mg/dL (ref 8–23)
CO2: 25 mmol/L (ref 22–32)
Calcium: 9.1 mg/dL (ref 8.9–10.3)
Chloride: 99 mmol/L (ref 98–111)
Creatinine, Ser: 1.05 mg/dL — ABNORMAL HIGH (ref 0.44–1.00)
GFR, Estimated: 52 mL/min — ABNORMAL LOW (ref >60)
Glucose, Bld: 143 mg/dL — ABNORMAL HIGH (ref 70–99)
Potassium: 4.2 mmol/L (ref 3.5–5.1)
Sodium: 133 mmol/L — ABNORMAL LOW (ref 135–145)
Total Bilirubin: 0.9 mg/dL (ref 0.3–1.2)
Total Protein: 6.8 g/dL (ref 6.5–8.1)

## 2020-07-31 LAB — LACTIC ACID, PLASMA: Lactic Acid, Venous: 1.3 mmol/L (ref 0.5–1.9)

## 2020-07-31 LAB — RESP PANEL BY RT-PCR (FLU A&B, COVID) ARPGX2
Influenza A by PCR: NEGATIVE
Influenza B by PCR: NEGATIVE
SARS Coronavirus 2 by RT PCR: NEGATIVE

## 2020-07-31 LAB — PROTIME-INR
INR: 1 (ref 0.8–1.2)
Prothrombin Time: 13.1 seconds (ref 11.4–15.2)

## 2020-07-31 LAB — CBG MONITORING, ED: Glucose-Capillary: 115 mg/dL — ABNORMAL HIGH (ref 70–99)

## 2020-07-31 LAB — AMMONIA: Ammonia: 9 umol/L (ref 9–35)

## 2020-07-31 MED ORDER — ENOXAPARIN SODIUM 40 MG/0.4ML ~~LOC~~ SOLN
40.0000 mg | SUBCUTANEOUS | Status: DC
  Administered 2020-07-31: 40 mg via SUBCUTANEOUS
  Filled 2020-07-31: qty 0.4

## 2020-07-31 MED ORDER — ONDANSETRON HCL 4 MG/2ML IJ SOLN: 4.0000 mg | Freq: Four times a day (QID) | INTRAMUSCULAR | Status: DC | PRN

## 2020-07-31 MED ORDER — ACETAMINOPHEN 650 MG RE SUPP: 650.0000 mg | Freq: Four times a day (QID) | RECTAL | Status: DC | PRN

## 2020-07-31 MED ORDER — ALPRAZOLAM 0.25 MG PO TABS
0.2500 mg | ORAL_TABLET | Freq: Every day | ORAL | Status: DC
  Administered 2020-07-31: 0.25 mg via ORAL
  Filled 2020-07-31: qty 1

## 2020-07-31 MED ORDER — ESCITALOPRAM OXALATE 10 MG PO TABS
30.0000 mg | ORAL_TABLET | Freq: Every day | ORAL | Status: DC
  Administered 2020-07-31 – 2020-08-03 (×4): 30 mg via ORAL
  Filled 2020-07-31 (×4): qty 3

## 2020-07-31 MED ORDER — RAMIPRIL 5 MG PO CAPS
5.0000 mg | ORAL_CAPSULE | Freq: Two times a day (BID) | ORAL | Status: DC
  Administered 2020-08-01 – 2020-08-02 (×3): 5 mg via ORAL
  Filled 2020-07-31 (×7): qty 1

## 2020-07-31 MED ORDER — ASPIRIN EC 81 MG PO TBEC
81.0000 mg | DELAYED_RELEASE_TABLET | Freq: Every day | ORAL | Status: DC
  Administered 2020-07-31 – 2020-08-03 (×4): 81 mg via ORAL
  Filled 2020-07-31 (×4): qty 1

## 2020-07-31 MED ORDER — POLYETHYLENE GLYCOL 3350 17 G PO PACK: 17.0000 g | PACK | Freq: Every day | ORAL | Status: DC | PRN

## 2020-07-31 MED ORDER — AMLODIPINE BESYLATE 5 MG PO TABS
2.5000 mg | ORAL_TABLET | Freq: Every day | ORAL | Status: DC
  Administered 2020-07-31 – 2020-08-03 (×4): 2.5 mg via ORAL
  Filled 2020-07-31 (×4): qty 1

## 2020-07-31 MED ORDER — ACETAMINOPHEN 325 MG PO TABS
650.0000 mg | ORAL_TABLET | Freq: Four times a day (QID) | ORAL | Status: DC | PRN
  Administered 2020-08-03: 650 mg via ORAL
  Filled 2020-07-31: qty 2

## 2020-07-31 MED ORDER — ONDANSETRON HCL 4 MG PO TABS: 4.0000 mg | ORAL_TABLET | Freq: Four times a day (QID) | ORAL | Status: DC | PRN

## 2020-07-31 MED ORDER — METOPROLOL SUCCINATE ER 25 MG PO TB24
25.0000 mg | ORAL_TABLET | Freq: Every day | ORAL | Status: DC
  Administered 2020-07-31 – 2020-08-03 (×3): 25 mg via ORAL
  Filled 2020-07-31 (×4): qty 1

## 2020-07-31 MED ORDER — SODIUM CHLORIDE 0.9 % IV SOLN: INTRAVENOUS | Status: AC

## 2020-07-31 NOTE — ED Provider Notes (Signed)
[facility] EMERGENCY DEPARTMENT Provider Note   CSN: [id number] Arrival date & time: 07/31/20  1320     History Chief Complaint  Patient presents with  . Weakness    [name] is a 86 y.o. female history of CAD, diabetes, CVA, hypertension, GERD.  Patient sent in today by family for confusion, urinary incontinence and abnormal behavior.  26 of history obtained by patient's husband Zenia Resides due to patient's altered mental status.  Zenia Resides reports that patient has seemed off for the past 10 days, she has seemed confused and would wander into a room and started messing with her hands but when he asked what she was doing she could not give him an answer.  She will often trial off in speech and will forget what she is doing halfway through.  They report that patient urinated on herself today which is very abnormal for her.  Patient is normally fully alert and oriented and takes care of her daily activities without assistance.  No history of recent illness, fever/chills, falls or injury, headaches, chest pain/shortness of breath no abdominal pain, nausea/vomiting or any additional concerns.  On exam patient is alert to self only confused to place time and event she reports some mild right knee pain ranges the joint without difficulty she denies any other concerns.  HPI     Past Medical History:  Diagnosis Date  . Allergic rhinitis   . Cataracts, bilateral   . Coronary atherosclerosis of native coronary artery    DES LAD 3/12, 99% nondominant RCA, No CAD otherwise, LVEF 65%  . Diabetic retinopathy ([place])   . Diverticulitis   . Essential hypertension   . GERD (gastroesophageal reflux disease)   . Hemorrhoids   . Hyperlipidemia   . Stroke, hemorrhagic ([place])    Right thalamic hemorrhage 4/12  . Syncope    Neurally mediated  . Type 2 diabetes mellitus [facility])     Patient Active Problem List   Diagnosis Date Noted  . Osteoarthritis of right knee 04/14/2020  . MCI (mild  cognitive impairment) 01/09/2016  . History of stroke 01/09/2016  . Acute blood loss anemia   . First degree hemorrhoids   . Mucosal abnormality of stomach   . Rectal bleed 09/30/2014  . BRBPR (bright red blood per rectum) 09/30/2014  . Type 2 diabetes mellitus ([place]) 03/16/2013  . Diabetic retinopathy ([place]) 03/16/2013  . Stroke ([place]) 01/24/2011  . Coronary atherosclerosis of native coronary artery 12/13/2010  . Mixed hyperlipidemia 11/05/2010  . Essential hypertension, benign 11/05/2010  . GERD 11/05/2010    Past Surgical History:  Procedure Laterality Date  . BREAST CYST EXCISION    . CARDIAC CATHETERIZATION    . CAROTID STENT  11/16/10  . COLONOSCOPY N/A 10/02/2014   Dr. Rourk:engorged internal hemorrhoids/pancolonic diverticulosis  . ESOPHAGOGASTRODUODENOSCOPY  10/02/14   Dr. Rourk:normal esophagus 2 cm hiatal hernia, gastric erosions likely from NSAID effect     OB History   No obstetric history on file.     Family History  Problem Relation Age of Onset  . Pancreatic cancer Mother        Died at age 32  . Diabetes Mother   . Cancer Mother   . Coronary artery disease Father        Died in his 38s  . Heart attack Father   . Hypertension Father   . Breast cancer Sister   . Cancer Sister     Social History   Tobacco Use  .  Smoking status: Former Smoker    Packs/day: 0.25    Types: Cigarettes    Quit date: 09/02/1988    Years since quitting: 31.9  . Smokeless tobacco: Never Used  Vaping Use  . Vaping Use: Never used  Substance Use Topics  . Alcohol use: No    Alcohol/week: 0.0 standard drinks  . Drug use: No    Home Medications Prior to Admission medications   Medication Sig Start Date End Date Taking? Authorizing Provider  ALPRAZolam (XANAX) 0.25 MG tablet Take 1 tablet (0.25 mg total) by mouth daily as needed. 01/13/20   Mikey Kirschner, MD  amLODipine (NORVASC) 2.5 MG tablet Take 1 tablet (2.5 mg total) by mouth daily. 07/25/20   Elvia Collum M, DO    aspirin 81 MG tablet Take 81 mg by mouth daily.      [provider]  escitalopram (LEXAPRO) 20 MG tablet Take 1.5 tablets (30 mg total) by mouth daily. 07/25/20   Lovena Le, Malena M, DO  ezetimibe (ZETIA) 10 MG tablet TAKE ONE TABLET BY MOUTH DAILY. 01/17/20   Satira Sark, MD  fluvastatin (LESCOL) 40 MG capsule Take 1 capsule (40 mg total) by mouth daily. 07/25/20   Lovena Le, Malena M, DO  glucose blood (ONE TOUCH ULTRA TEST) test strip USE AS DIRECTED ONCE DAILY. 07/22/19   Mikey Kirschner, MD  metoprolol succinate (TOPROL-XL) 25 MG 24 hr tablet TAKE (1) TABLET BY MOUTH AT BEDTIME. NEEDS OFFICE VISIT 07/25/20   Elvia Collum M, DO  nitroGLYCERIN (NITROSTAT) 0.4 MG SL tablet Dissolve 1 tablet under tongue every 5 mins up to 3 dose in 15 mins for chest pain. If no relief call 911. 11/26/19   Satira Sark, MD  ramipril (ALTACE) 5 MG capsule Take 1 capsule (5 mg total) by mouth 2 (two) times daily. 07/25/20   Erven Colla, DO    Allergies    Macrodantin, Pravachol [pravastatin sodium], and Tetanus toxoids  Review of Systems   Review of Systems  Unable to perform ROS: Mental status change    Physical Exam Updated Vital Signs BP (!) 142/56   Pulse (!) 56   Temp 98.3 F (36.8 C) (Oral)   Resp 18   Ht 5\' 1"  (1.549 m)   Wt 65.8 kg   SpO2 96%   BMI 27.41 kg/m   Physical Exam Constitutional:      General: She is not in acute distress.    Appearance: Normal appearance. She is well-developed. She is not ill-appearing or diaphoretic.  HENT:     Head: Normocephalic and atraumatic.     Right Ear: External ear normal.     Left Ear: External ear normal.     Mouth/Throat:     Mouth: Mucous membranes are moist.     Pharynx: Oropharynx is clear.  Eyes:     General: Vision grossly intact. Gaze aligned appropriately.     Pupils: Pupils are equal, round, and reactive to light.  Neck:     Trachea: Trachea and phonation normal.  Cardiovascular:     Rate and Rhythm:  Normal rate and regular rhythm.  Pulmonary:     Effort: Pulmonary effort is normal. No respiratory distress.     Breath sounds: Normal breath sounds.  Abdominal:     General: There is no distension.     Palpations: Abdomen is soft.     Tenderness: There is no abdominal tenderness. There is no guarding or rebound.  Musculoskeletal:  General: Normal range of motion.     Cervical back: Normal range of motion.     Comments: No midline C/T/L spinal tenderness to palpation, no paraspinal muscle tenderness, no deformity, crepitus, or step-off noted. No sign of injury to the neck or back.  Pelvis stable to compression bilateral without pain.  Patient able bring bilateral knees to chest without pain or difficulty.  Patient is able to stand without pain.  Good range of motion of the right knee without evidence of infection or injury.  Skin:    General: Skin is warm and dry.  Neurological:     Mental Status: She is alert.     GCS: GCS eye subscore is 4. GCS verbal subscore is 4. GCS motor subscore is 6.     Comments: Speech is clear, patient is somewhat disoriented, follows commands Major Cranial nerves without deficit, no facial droop Moves extremities without ataxia, coordination intact  Psychiatric:        Behavior: Behavior normal.     ED Results / Procedures / Treatments   Labs (all labs ordered are listed, but only abnormal results are displayed) Labs Reviewed  CBC WITH DIFFERENTIAL/PLATELET - Abnormal; Notable for the following components:      Result Value   RBC 3.79 (*)    Hemoglobin 11.8 (*)    HCT 35.9 (*)    All other components within normal limits  COMPREHENSIVE METABOLIC PANEL - Abnormal; Notable for the following components:   Sodium 133 (*)    Glucose, Bld 143 (*)    Creatinine, Ser 1.05 (*)    GFR, Estimated 52 (*)    All other components within normal limits  URINALYSIS, ROUTINE W REFLEX MICROSCOPIC - Abnormal; Notable for the following components:   Hgb urine  dipstick SMALL (*)    Bacteria, UA FEW (*)    All other components within normal limits  CBG MONITORING, ED - Abnormal; Notable for the following components:   Glucose-Capillary 115 (*)    All other components within normal limits  URINE CULTURE  LACTIC ACID, PLASMA  AMMONIA  PROTIME-INR    EKG EKG Interpretation  Date/Time:  Monday July 31 2020 15:13:30 EST Ventricular Rate:  57 PR Interval:  184 QRS Duration: 84 QT Interval:  448 QTC Calculation: 436 R Axis:   32 Text Interpretation: Sinus bradycardia Otherwise normal ECG No significant change since prior 4/12 Confirmed by Aletta Edouard [phone]) on 07/31/2020 3:21:19 PM   Radiology DG Chest 1 View  Result Date: 07/31/2020 CLINICAL DATA:  Weakness. EXAM: CHEST  1 VIEW COMPARISON:  December 19, 2010. FINDINGS: The heart size and mediastinal contours are within normal limits. Both lungs are clear. The visualized skeletal structures are unremarkable. IMPRESSION: No active disease. Electronically Signed   By: Marijo Conception M.D.   On: 07/31/2020 15:09   CT Head Wo Contrast  Result Date: 07/31/2020 CLINICAL DATA:  Mental status change, unknown cause. Weakness, confusion per family which began today, denies injury. EXAM: CT HEAD WITHOUT CONTRAST TECHNIQUE: Contiguous axial images were obtained from the base of the skull through the vertex without intravenous contrast. COMPARISON:  Brain MRI 04/03/2011.  Head CT 12/20/2010. FINDINGS: Brain: Mild-to-moderate generalized cerebral atrophy. Moderate ill-defined hypoattenuation within the cerebral white matter is nonspecific, but compatible chronic small vessel ischemic disease. Lateral and third ventriculomegaly is at least partially related to cerebral volume loss. However, there is prominence of the sylvian fissures bilaterally. Additionally, there is crowding of the cerebral sulci along the high  cerebral convexities and a decreased callosal angle. These findings may be seen in the  setting of normal pressure hydrocephalus. There is no acute intracranial hemorrhage. No demarcated cortical infarct. No extra-axial fluid collection. No evidence of intracranial mass. No midline shift. Vascular: No hyperdense vessel.  Atherosclerotic calcifications. Skull: Normal. Negative for fracture or focal lesion. Sinuses/Orbits: Visualized orbits show no acute finding. Mild ethmoid sinus mucosal thickening. IMPRESSION: No evidence of acute intracranial hemorrhage or acute infarction. Mild lateral and third ventriculomegaly is at least partially related to cerebral volume loss. However, there are also some imaging findings which may be seen in the setting of normal pressure hydrocephalus and clinical correlation is recommended. Moderate cerebral white matter chronic small vessel ischemic disease. Electronically Signed   By: Kellie Simmering DO   On: 07/31/2020 15:43   CT Cervical Spine Wo Contrast  Result Date: 07/31/2020 CLINICAL DATA:  Trauma. EXAM: CT CERVICAL SPINE WITHOUT CONTRAST TECHNIQUE: Multidetector CT imaging of the cervical spine was performed without intravenous contrast. Multiplanar CT image reconstructions were also generated. COMPARISON:  None. FINDINGS: Alignment: Straightening of the normal cervical lordosis. Mild stepwise degenerative retrolisthesis of C5 on C6 and C6 on C7. Skull base and vertebrae: No evidence of acute fracture. Vertebral body heights are maintained. Osteopenia. Soft tissues and spinal canal: No prevertebral fluid or swelling. No visible canal hematoma. Disc levels: Multilevel degenerative disc disease with disc height loss and posterior spurring. There is a left eccentric posterior disc osteophyte complex at C6-C7 which results in at least mild canal stenosis and at least moderate left foraminal stenosis. Multilevel facet and uncovertebral hypertrophy with multilevel suspected moderate to severe foraminal stenosis. Possibly severe foraminal stenosis on the right at  C5-C6. Upper chest: Negative. IMPRESSION: 1. No evidence of acute fracture or traumatic malalignment. 2. Multilevel degenerative changes, as detailed above. Suspected multilevel moderate to severe foraminal stenosis. MRI could better characterize if clinically indicated. Electronically Signed   By: Margaretha Sheffield MD   On: 07/31/2020 15:42   DG Knee Complete 4 Views Right  Result Date: 07/31/2020 CLINICAL DATA:  Right knee pain without known injury. EXAM: RIGHT KNEE - COMPLETE 4+ VIEW COMPARISON:  None. FINDINGS: No evidence of fracture, dislocation, or joint effusion. Severe narrowing of medial joint space is noted. Soft tissues are unremarkable. IMPRESSION: Severe degenerative joint disease. No acute abnormality seen in the right knee. Electronically Signed   By: Marijo Conception M.D.   On: 07/31/2020 15:09    Procedures Procedures (including critical care time)  Medications Ordered in ED Medications - No data to display  ED Course  I have reviewed the triage vital signs and the nursing notes.  Pertinent labs & imaging results that were available during my care of the patient were reviewed by me and considered in my medical decision making (see chart for details).  Clinical Course as of Jul 31 1701  Mon Jul 31, 2020  1431 Lucillia, [place]on (Spouse)  [phone] ([place])     [BM]  4941 86 year old female from home here with generalized weakness and some increased confusion.  Urinary incontinence.  Getting labs urinalysis CT x-rays.  Disposition per results of testing.   [MB]    Clinical Course User Index [BM] Deliah Boston, PA-C [MB] Hayden Rasmussen, MD   MDM Rules/Calculators/A&P                         Additional history obtained from: 1. Nursing notes from this visit. 2.  Family members. 3. Review of electronic medical records.  Patient had primary care doctors visit on 07/25/2020.  Appears she has a new prescription for tramadol given for chronic right knee  pain. --------------------------------- I ordered, reviewed and interpreted labs which include: Lactic 1.3 is reassuring CBG 115 no evidence of hypoglycemia Ammonia within normal limits doubt hepatic encephalopathy CMP shows no emergent Electra derangement, AKI, LFT elevations or gap. CBC without leukocytosis to suggest infection, hemoglobin of 11.8 appears slightly decreased from prior. Urinalysis shows RBCs, few bacteria and hemoglobin but no clear evidence of infection.  EKG: Sinus bradycardia Otherwise normal ECG No significant change since prior 4/12 Confirmed by Aletta Edouard [phone]) on 07/31/2020 3:21:19 PM  CT Head: IMPRESSION:  No evidence of acute intracranial hemorrhage or acute infarction.    Mild lateral and third ventriculomegaly is at least partially  related to cerebral volume loss. However, there are also some  imaging findings which may be seen in the setting of normal pressure  hydrocephalus and clinical correlation is recommended.    Moderate cerebral white matter chronic small vessel ischemic  disease.   CT Cspine:  IMPRESSION:  1. No evidence of acute fracture or traumatic malalignment.  2. Multilevel degenerative changes, as detailed above. Suspected  multilevel moderate to severe foraminal stenosis. MRI could better  characterize if clinically indicated.   DG Chest:    IMPRESSION:  No active disease.   CT Right Knee:  IMPRESSION:  Severe degenerative joint disease. No acute abnormality seen in the  right knee.  ------------ Patient reassessed, her daughter is sitting at bedside.  Updated on findings today and daughter states understanding.  I then attempted to ambulate the patient she has a very unsteady gait.  Given urinary incontinence, gait abnormality and confusion this is possibly due to normal pressure hydrocephalus however recently prescribed tramadol may be a factor.  Patient will need admission to the hospital for further evaluation and  daughter and patient are agreeable to plan.  Consult placed to hospitalist service.  Discussed case and plan of care with Dr. Karle Starch who is attending after shift change. - 5:02 PM: Consulted with Dr. Denton Brick, patient accepted to hospitalist service.  Note: Portions of this report may have been transcribed using voice recognition software. Every effort was made to ensure accuracy; however, inadvertent computerized transcription errors may still be present. Final Clinical Impression(s) / ED Diagnoses Final diagnoses:  Disorientation    Rx / DC Orders ED Discharge Orders    None       Gari Crown 07/31/20 1703    Hayden Rasmussen, MD 07/31/20 [phone]

## 2020-07-31 NOTE — ED Triage Notes (Signed)
Patient from home, family concerned because she has been slightly confused and urinated on herself today which is not normal for her. Pt states she feels very weak.

## 2020-07-31 NOTE — H&P (Addendum)
History and Physical    [name] JQZ:[id number] DOB: [birth year] DOA: 07/31/2020  PCP: Erven Colla, DO   Patient coming from: Home  I have personally briefly reviewed patient's old medical records in [place]  Chief Complaint: Confusion  HPI: [name] is a 86 y.o. female with medical history significant for diabetes mellitus, hypertension, stroke in 2012. Patient was brought to the ED reports of increasing confusion.  Symptoms initially started about 2 weeks ago, but daughter present at bedside reports that over the past 4 days patient has rapidly declined.  She reports patient is constantly reaching for something that is not there, wandering around in her feet tremors sitting down to staring into space, speech trailing off and patient forgetting what she was about to do. Over the past week patient has had episodes of urinary incontinence, daughter reports this has never happened before.  Patient tells me she has urgency and incontinence. Daughter reports after patient stroke in 2012, patient's gait was changed slightly, not picking up her feet from the floor when she walks, but over the past week this has significantly worsened, she also reports patient's gait has slowed..  Today patient was so weak, patient's daughter and patient's son had difficulty getting patient up. Prior to this patient has a cane, but ambulates without assistance.  Patient was able to go on a trip in October with family and he had no issues.  Patient denies difficulty breathing, no chest pain, no pain with urination.  She reports pain in her knees and back.  Patient lives with her husband.  Daughter voices concern that patient cannot go back home in this state, as husband is also elderly and would not be able to take care of patient.  ED Course: Heart rate 50s.  Otherwise stable vitals.  Very few bacteria.  Unremarkable CBC BMP.  Lactic acid 1.3.  Ammonia 9.  Chest x-ray, right knee  x-ray without acute abnormality.  Head CT mild lateral and third ventriculomegaly-at least partially related to cerebral volume loss.  Findings also concerning for normal pressure hydrocephalus.  Patient unable to ambulate in the ED. Cervical spine CT without acute abnormality.  Shows multilevel moderate to severe foraminal stenosis. Hospitalist called to admit for altered mental status.   Review of Systems: As per HPI all other systems reviewed and negative.  Past Medical History:  Diagnosis Date  . Allergic rhinitis   . Cataracts, bilateral   . Coronary atherosclerosis of native coronary artery    DES LAD 3/12, 99% nondominant RCA, No CAD otherwise, LVEF 65%  . Diabetic retinopathy ([place])   . Diverticulitis   . Essential hypertension   . GERD (gastroesophageal reflux disease)   . Hemorrhoids   . Hyperlipidemia   . Stroke, hemorrhagic ([place])    Right thalamic hemorrhage 4/12  . Syncope    Neurally mediated  . Type 2 diabetes mellitus ([place])     Past Surgical History:  Procedure Laterality Date  . BREAST CYST EXCISION    . CARDIAC CATHETERIZATION    . CAROTID STENT  11/16/10  . COLONOSCOPY N/A 10/02/2014   Dr. Rourk:engorged internal hemorrhoids/pancolonic diverticulosis  . ESOPHAGOGASTRODUODENOSCOPY  10/02/14   Dr. Rourk:normal esophagus 2 cm hiatal hernia, gastric erosions likely from NSAID effect     reports that she quit smoking about 31 years ago. Her smoking use included cigarettes. She smoked 0.25 packs per day. She has never used smokeless tobacco. She reports that she does not drink alcohol  and does not use drugs.  Allergies  Allergen Reactions  . Macrodantin   . Pravachol [Pravastatin Sodium]   . Tetanus Toxoids     Family History  Problem Relation Age of Onset  . Pancreatic cancer Mother        Died at age 29  . Diabetes Mother   . Cancer Mother   . Coronary artery disease Father        Died in his 21s  . Heart attack Father   . Hypertension Father   .  Breast cancer Sister   . Cancer Sister     Prior to Admission medications   Medication Sig Start Date End Date Taking? Authorizing Provider  ALPRAZolam (XANAX) 0.25 MG tablet Take 1 tablet (0.25 mg total) by mouth daily as needed. 01/13/20   Mikey Kirschner, MD  amLODipine (NORVASC) 2.5 MG tablet Take 1 tablet (2.5 mg total) by mouth daily. 07/25/20   Elvia Collum M, DO  aspirin 81 MG tablet Take 81 mg by mouth daily.      [provider]  escitalopram (LEXAPRO) 20 MG tablet Take 1.5 tablets (30 mg total) by mouth daily. 07/25/20   Lovena Le, Malena M, DO  ezetimibe (ZETIA) 10 MG tablet TAKE ONE TABLET BY MOUTH DAILY. 01/17/20   Satira Sark, MD  fluvastatin (LESCOL) 40 MG capsule Take 1 capsule (40 mg total) by mouth daily. 07/25/20   Lovena Le, Malena M, DO  glucose blood (ONE TOUCH ULTRA TEST) test strip USE AS DIRECTED ONCE DAILY. 07/22/19   Mikey Kirschner, MD  metoprolol succinate (TOPROL-XL) 25 MG 24 hr tablet TAKE (1) TABLET BY MOUTH AT BEDTIME. NEEDS OFFICE VISIT 07/25/20   Elvia Collum M, DO  nitroGLYCERIN (NITROSTAT) 0.4 MG SL tablet Dissolve 1 tablet under tongue every 5 mins up to 3 dose in 15 mins for chest pain. If no relief call 911. 11/26/19   Satira Sark, MD  ramipril (ALTACE) 5 MG capsule Take 1 capsule (5 mg total) by mouth 2 (two) times daily. 07/25/20   Erven Colla, DO    Physical Exam: Vitals:   07/31/20 1351 07/31/20 1353 07/31/20 1354 07/31/20 1600  BP:  137/60  (!) 142/56  Pulse:  (!) 58  (!) 56  Resp:  20  18  Temp:  98.3 F (36.8 C)    TempSrc:  Oral    SpO2: 95% 96%  96%  Weight:   65.8 kg   Height:   5\' 1"  (1.549 m)     Constitutional: NAD, calm, comfortable Vitals:   07/31/20 1351 07/31/20 1353 07/31/20 1354 07/31/20 1600  BP:  137/60  (!) 142/56  Pulse:  (!) 58  (!) 56  Resp:  20  18  Temp:  98.3 F (36.8 C)    TempSrc:  Oral    SpO2: 95% 96%  96%  Weight:   65.8 kg   Height:   5\' 1"  (1.549 m)    Eyes: PERRL, lids  and conjunctivae normal ENMT: Mucous membranes are dry Neck: normal, supple, no masses, no thyromegaly Respiratory: clear to auscultation bilaterally, no wheezing, no crackles. Normal respiratory effort. No accessory muscle use.  Cardiovascular: Regular rate and rhythm, no murmurs / rubs / gallops. No extremity edema. 2+ pedal pulses.  Abdomen: no tenderness, no masses palpated. No hepatosplenomegaly. Bowel sounds positive.  Musculoskeletal: no clubbing / cyanosis. No joint deformity upper and lower extremities. Good ROM, no contractures. Normal muscle tone.  Skin: no rashes, lesions, ulcers.  No induration Neurologic: Unable to follow two-step directions, CN 2-12 grossly intact. Sensation intact, DTR normal. Strength 5/5 in all 4.  Psychiatric: Answering questions appropriately, alert and oriented to person place, situation.  But Not oriented to time-tells me [name] is president and year 2022.Marland Kitchen  Answers at this time are appropriate, but she does not remember some details.  Labs on Admission: I have personally reviewed following labs and imaging studies  CBC: Recent Labs  Lab 07/25/20 1146 07/31/20 1432  WBC 6.3 7.7  NEUTROABS  --  5.5  HGB 13.5 11.8*  HCT 40.7 35.9*  MCV 92 94.7  PLT 320 630   Basic Metabolic Panel: Recent Labs  Lab 07/25/20 1146 07/31/20 1432  NA 137 133*  K 4.6 4.2  CL 98 99  CO2 26 25  GLUCOSE 93 143*  BUN 15 17  CREATININE 0.96 1.05*  CALCIUM 9.8 9.1   Liver Function Tests: Recent Labs  Lab 07/25/20 1146 07/31/20 1432  AST 17 18  ALT 13 13  ALKPHOS 100 70  BILITOT 0.4 0.9  PROT 6.9 6.8  ALBUMIN 4.3 3.6    Recent Labs  Lab 07/31/20 1523  AMMONIA 9   Coagulation Profile: Recent Labs  Lab 07/31/20 1432  INR 1.0   CBG: Recent Labs  Lab 07/31/20 1529  GLUCAP 115*   Urine analysis:    Component Value Date/Time   COLORURINE YELLOW 07/31/2020 1430   APPEARANCEUR CLEAR 07/31/2020 1430   LABSPEC 1.020 07/31/2020 1430   PHURINE 5.0  07/31/2020 1430   GLUCOSEU NEGATIVE 07/31/2020 1430   HGBUR SMALL (A) 07/31/2020 1430   BILIRUBINUR NEGATIVE 07/31/2020 1430   KETONESUR NEGATIVE 07/31/2020 1430   PROTEINUR NEGATIVE 07/31/2020 1430   UROBILINOGEN 0.2 12/18/2010 1224   NITRITE NEGATIVE 07/31/2020 1430   LEUKOCYTESUR NEGATIVE 07/31/2020 1430    Radiological Exams on Admission: DG Chest 1 View  Result Date: 07/31/2020 CLINICAL DATA:  Weakness. EXAM: CHEST  1 VIEW COMPARISON:  December 19, 2010. FINDINGS: The heart size and mediastinal contours are within normal limits. Both lungs are clear. The visualized skeletal structures are unremarkable. IMPRESSION: No active disease. Electronically Signed   By: Marijo Conception M.D.   On: 07/31/2020 15:09   CT Head Wo Contrast  Result Date: 07/31/2020 CLINICAL DATA:  Mental status change, unknown cause. Weakness, confusion per family which began today, denies injury. EXAM: CT HEAD WITHOUT CONTRAST TECHNIQUE: Contiguous axial images were obtained from the base of the skull through the vertex without intravenous contrast. COMPARISON:  Brain MRI 04/03/2011.  Head CT 12/20/2010. FINDINGS: Brain: Mild-to-moderate generalized cerebral atrophy. Moderate ill-defined hypoattenuation within the cerebral white matter is nonspecific, but compatible chronic small vessel ischemic disease. Lateral and third ventriculomegaly is at least partially related to cerebral volume loss. However, there is prominence of the sylvian fissures bilaterally. Additionally, there is crowding of the cerebral sulci along the high cerebral convexities and a decreased callosal angle. These findings may be seen in the setting of normal pressure hydrocephalus. There is no acute intracranial hemorrhage. No demarcated cortical infarct. No extra-axial fluid collection. No evidence of intracranial mass. No midline shift. Vascular: No hyperdense vessel.  Atherosclerotic calcifications. Skull: Normal. Negative for fracture or focal lesion.  Sinuses/Orbits: Visualized orbits show no acute finding. Mild ethmoid sinus mucosal thickening. IMPRESSION: No evidence of acute intracranial hemorrhage or acute infarction. Mild lateral and third ventriculomegaly is at least partially related to cerebral volume loss. However, there are also some imaging findings which may be seen  in the setting of normal pressure hydrocephalus and clinical correlation is recommended. Moderate cerebral white matter chronic small vessel ischemic disease. Electronically Signed   By: Kellie Simmering DO   On: 07/31/2020 15:43   CT Cervical Spine Wo Contrast  Result Date: 07/31/2020 CLINICAL DATA:  Trauma. EXAM: CT CERVICAL SPINE WITHOUT CONTRAST TECHNIQUE: Multidetector CT imaging of the cervical spine was performed without intravenous contrast. Multiplanar CT image reconstructions were also generated. COMPARISON:  None. FINDINGS: Alignment: Straightening of the normal cervical lordosis. Mild stepwise degenerative retrolisthesis of C5 on C6 and C6 on C7. Skull base and vertebrae: No evidence of acute fracture. Vertebral body heights are maintained. Osteopenia. Soft tissues and spinal canal: No prevertebral fluid or swelling. No visible canal hematoma. Disc levels: Multilevel degenerative disc disease with disc height loss and posterior spurring. There is a left eccentric posterior disc osteophyte complex at C6-C7 which results in at least mild canal stenosis and at least moderate left foraminal stenosis. Multilevel facet and uncovertebral hypertrophy with multilevel suspected moderate to severe foraminal stenosis. Possibly severe foraminal stenosis on the right at C5-C6. Upper chest: Negative. IMPRESSION: 1. No evidence of acute fracture or traumatic malalignment. 2. Multilevel degenerative changes, as detailed above. Suspected multilevel moderate to severe foraminal stenosis. MRI could better characterize if clinically indicated. Electronically Signed   By: Margaretha Sheffield MD   On:  07/31/2020 15:42   DG Knee Complete 4 Views Right  Result Date: 07/31/2020 CLINICAL DATA:  Right knee pain without known injury. EXAM: RIGHT KNEE - COMPLETE 4+ VIEW COMPARISON:  None. FINDINGS: No evidence of fracture, dislocation, or joint effusion. Severe narrowing of medial joint space is noted. Soft tissues are unremarkable. IMPRESSION: Severe degenerative joint disease. No acute abnormality seen in the right knee. Electronically Signed   By: Marijo Conception M.D.   On: 07/31/2020 15:09    EKG: Independently reviewed.  Sinus rhythm QTc 436.  No significant ST-T wave changes compared to prior.  Assessment/Plan Principal Problem:   AMS (altered mental status) Active Problems:   Essential hypertension, benign   Stroke (H[facility])   Type 2 diabetes mellitus (H[facility])   Diabetic retinopathy ([place])  Metabolic encephalopathy/new dementia-rapid decline in past few days, also with gait abnormalities, urinary incontinence.  Drastic decline from baseline.  Head CT suggesting normal pressure hydrocephalus.  UA chest x-ray without acute abnormality.  Doubt infectious etiology.  She is afebrile without leukocytosis, no lactic acid. -Obtain brain MRI with and without contrast-no acute abnormality, shows progression of atrophy and chronic microhemorrhage, chronic hemorrhage in thalamus bilaterally. -Continue Lexapro, nightly Xanax 0.5 mg -PT eval to determine disposition. - N/s 75cc/hr x 15hrs -Follow-up urine cultures ordered in ED. -Will consult neurology  Hypertension-stable. -Resume Norvasc 2.5, metoprolol 25mg  daily, ramipril 5 mg BID  DM-random glucose 143.  Diet controlled. -Fasting blood sugars - HgbA1c  Hx of stroke 2012-minimal residual deficits. -Resume aspirin, Zetia.   DVT prophylaxis: Switch Lovenox to SCDs with MRI findings. Code Status:  Full code-confirmed with daughter at bedside. Family Communication: Daughter [name] at bedside. Disposition Plan: ~ 1 - 2 days Consults called:  Neurology Admission status: Observation, telemetry.   Bethena Roys MD [facility]  07/31/2020, 5:03 PM

## 2020-08-01 ENCOUNTER — Encounter (HOSPITAL_COMMUNITY): Payer: Self-pay | Admitting: Internal Medicine

## 2020-08-01 ENCOUNTER — Other Ambulatory Visit: Payer: Self-pay

## 2020-08-01 DIAGNOSIS — G9389 Other specified disorders of brain: Secondary | ICD-10-CM | POA: Diagnosis present

## 2020-08-01 DIAGNOSIS — N390 Urinary tract infection, site not specified: Secondary | ICD-10-CM | POA: Diagnosis present

## 2020-08-01 DIAGNOSIS — Z7982 Long term (current) use of aspirin: Secondary | ICD-10-CM | POA: Diagnosis not present

## 2020-08-01 DIAGNOSIS — I2510 Atherosclerotic heart disease of native coronary artery without angina pectoris: Secondary | ICD-10-CM | POA: Diagnosis present

## 2020-08-01 DIAGNOSIS — R4182 Altered mental status, unspecified: Secondary | ICD-10-CM | POA: Diagnosis not present

## 2020-08-01 DIAGNOSIS — I10 Essential (primary) hypertension: Secondary | ICD-10-CM

## 2020-08-01 DIAGNOSIS — E11319 Type 2 diabetes mellitus with unspecified diabetic retinopathy without macular edema: Secondary | ICD-10-CM

## 2020-08-01 DIAGNOSIS — F419 Anxiety disorder, unspecified: Secondary | ICD-10-CM | POA: Diagnosis present

## 2020-08-01 DIAGNOSIS — Z803 Family history of malignant neoplasm of breast: Secondary | ICD-10-CM | POA: Diagnosis not present

## 2020-08-01 DIAGNOSIS — F0390 Unspecified dementia without behavioral disturbance: Secondary | ICD-10-CM | POA: Diagnosis present

## 2020-08-01 DIAGNOSIS — G9341 Metabolic encephalopathy: Secondary | ICD-10-CM | POA: Diagnosis present

## 2020-08-01 DIAGNOSIS — R410 Disorientation, unspecified: Secondary | ICD-10-CM

## 2020-08-01 DIAGNOSIS — Z87891 Personal history of nicotine dependence: Secondary | ICD-10-CM | POA: Diagnosis not present

## 2020-08-01 DIAGNOSIS — K219 Gastro-esophageal reflux disease without esophagitis: Secondary | ICD-10-CM | POA: Diagnosis present

## 2020-08-01 DIAGNOSIS — F32A Depression, unspecified: Secondary | ICD-10-CM | POA: Diagnosis present

## 2020-08-01 DIAGNOSIS — Z833 Family history of diabetes mellitus: Secondary | ICD-10-CM | POA: Diagnosis not present

## 2020-08-01 DIAGNOSIS — H269 Unspecified cataract: Secondary | ICD-10-CM | POA: Diagnosis present

## 2020-08-01 DIAGNOSIS — R2689 Other abnormalities of gait and mobility: Secondary | ICD-10-CM | POA: Diagnosis not present

## 2020-08-01 DIAGNOSIS — E871 Hypo-osmolality and hyponatremia: Secondary | ICD-10-CM | POA: Diagnosis present

## 2020-08-01 DIAGNOSIS — Z800 Family history of malignant neoplasm of digestive organs: Secondary | ICD-10-CM | POA: Diagnosis not present

## 2020-08-01 DIAGNOSIS — M25561 Pain in right knee: Secondary | ICD-10-CM | POA: Diagnosis present

## 2020-08-01 DIAGNOSIS — E782 Mixed hyperlipidemia: Secondary | ICD-10-CM | POA: Diagnosis present

## 2020-08-01 DIAGNOSIS — I69398 Other sequelae of cerebral infarction: Secondary | ICD-10-CM | POA: Diagnosis not present

## 2020-08-01 DIAGNOSIS — Z9181 History of falling: Secondary | ICD-10-CM | POA: Diagnosis not present

## 2020-08-01 DIAGNOSIS — Z79899 Other long term (current) drug therapy: Secondary | ICD-10-CM | POA: Diagnosis not present

## 2020-08-01 DIAGNOSIS — Z8249 Family history of ischemic heart disease and other diseases of the circulatory system: Secondary | ICD-10-CM | POA: Diagnosis not present

## 2020-08-01 DIAGNOSIS — R531 Weakness: Secondary | ICD-10-CM | POA: Diagnosis present

## 2020-08-01 DIAGNOSIS — Z20822 Contact with and (suspected) exposure to covid-19: Secondary | ICD-10-CM | POA: Diagnosis present

## 2020-08-01 LAB — FOLATE: Folate: 17 ng/mL (ref >5.9)

## 2020-08-01 LAB — HEMOGLOBIN A1C
Hgb A1c MFr Bld: 5.7 % — ABNORMAL HIGH (ref 4.8–5.6)
Mean Plasma Glucose: 116.89 mg/dL

## 2020-08-01 LAB — TSH: TSH: 3.052 u[IU]/mL (ref 0.350–4.500)

## 2020-08-01 LAB — CBG MONITORING, ED: Glucose-Capillary: 119 mg/dL — ABNORMAL HIGH (ref 70–99)

## 2020-08-01 LAB — VITAMIN B12: Vitamin B-12: 390 pg/mL (ref 180–914)

## 2020-08-01 MED ORDER — MELATONIN 5 MG PO TABS
5.0000 mg | ORAL_TABLET | Freq: Every day | ORAL | Status: DC
  Filled 2020-08-01: qty 1

## 2020-08-01 MED ORDER — ALPRAZOLAM 0.25 MG PO TABS
0.2500 mg | ORAL_TABLET | Freq: Every day | ORAL | Status: DC
  Administered 2020-08-01: 0.25 mg via ORAL
  Filled 2020-08-01: qty 1

## 2020-08-01 MED ORDER — ALPRAZOLAM 0.25 MG PO TABS
0.2500 mg | ORAL_TABLET | ORAL | Status: DC
  Filled 2020-08-01 (×2): qty 1

## 2020-08-01 NOTE — Consult Note (Signed)
[name] A. Merlene Laughter, MD     [email or web address]          [name] is an 86 y.o. female.   ASSESSMENT/PLAN: 1.  Acute encephalopathy likely toxic metabolic.  Work-up has not revealed any clear structural abnormalities.  She also has gait impairment which is thought to be due to metabolic issues.  She has a low-grade UTI which is the only positive finding.  There is a question of NPH on the CT scan but this is a very difficult diagnosis to make at this time.  The presentation does not suggest NPH.  Recommendations continue with supportive care and physical therapy. 2.  Baseline cognitive impairment likely due to vascular causes. 3.  Multifactorial gait impairment at baseline with acute worsening.  The patient 86 year old white female who presents with the progressive cognitive and functional impairment.  The patient did see her primary care provider about a week ago.  It appears to be in somewhat of a routine appointment but there were concerns about worsening memory problems.  It appears that since then the memory problem and cognition has gotten worse.  Additionally, she has had significant progression in her ability to function.  There were some changes made to the patient's medication on that visit.  She was started on tramadol and the Lexapro was increased from 20 mg to 30 mg.  She has been admitted for further work-up.  There has been concerns for possible normal pressure hydrocephalus on imaging.  The work-up has been positive for some evidence of the UTI although this appears mild.  The son reports that the patient has had cognitive impairment since her stroke about 2 years ago.  It appears she also had right-sided weakness and has had difficulty ambulating since then.  However, it appears that she had a really dramatic downturn over the last few days.  The review of systems otherwise negative.     GENERAL: She appears to be well at this time and cooperates  with evaluation.  HEENT: No trauma noted.  Neck is supple.  ABDOMEN: soft  EXTREMITIES: No edema; there is significant arthritic changes of the knees bilaterally.  BACK: Normal  SKIN: Normal by inspection.    MENTAL STATUS: She is awake and alert and follows commands briskly.  She is disoriented however.  She thinks is 61 and that the month is July.  She knows that she is in the hospital in [place].  CRANIAL NERVES: Pupils are equal, round and reactive to light and accomodation; extra ocular movements are full, there is no significant nystagmus; visual fields are full; upper and lower facial muscles are normal in strength and symmetric, there is no flattening of the nasolabial folds; tongue is midline; uvula is midline; shoulder elevation is normal.  MOTOR: There is mild weakness of the right lower extremity proximally 4/5.  Distal is 5 however.  The other extremities shows normal tone, bulk and strength.  COORDINATION: Left finger to nose is normal, right finger to nose is normal, No rest tremor; no intention tremor; no postural tremor; no bradykinesia.  REFLEXES: Deep tendon reflexes are symmetrical and normal.    SENSATION: Normal to light touch, temperature, and pain.  GAIT: This is unsteady and wide.      Blood pressure (!) 152/49, pulse (!) 54, temperature 98.3 F (36.8 C), temperature source Oral, resp. rate 16, height 5\' 1"  (1.549 m), weight 65.8 kg, SpO2 93 %.  Past Medical History:  Diagnosis Date  . Allergic  rhinitis   . Cataracts, bilateral   . Coronary atherosclerosis of native coronary artery    DES LAD 3/12, 99% nondominant RCA, No CAD otherwise, LVEF 65%  . Diabetic retinopathy ([place])   . Diverticulitis   . Essential hypertension   . GERD (gastroesophageal reflux disease)   . Hemorrhoids   . Hyperlipidemia   . Stroke, hemorrhagic ([place])    Right thalamic hemorrhage 4/12  . Syncope    Neurally mediated  . Type 2 diabetes mellitus ([place])     Past  Surgical History:  Procedure Laterality Date  . BREAST CYST EXCISION    . CARDIAC CATHETERIZATION    . CAROTID STENT  11/16/10  . COLONOSCOPY N/A 10/02/2014   Dr. Rourk:engorged internal hemorrhoids/pancolonic diverticulosis  . ESOPHAGOGASTRODUODENOSCOPY  10/02/14   Dr. Rourk:normal esophagus 2 cm hiatal hernia, gastric erosions likely from NSAID effect    Family History  Problem Relation Age of Onset  . Pancreatic cancer Mother        Died at age 47  . Diabetes Mother   . Cancer Mother   . Coronary artery disease Father        Died in his 20s  . Heart attack Father   . Hypertension Father   . Breast cancer Sister   . Cancer Sister     Social History:  reports that she quit smoking about 31 years ago. Her smoking use included cigarettes. She smoked 0.25 packs per day. She has never used smokeless tobacco. She reports that she does not drink alcohol and does not use drugs.  Allergies:  Allergies  Allergen Reactions  . Macrodantin   . Pravachol [Pravastatin Sodium]   . Tetanus Toxoids     Medications: Prior to Admission medications   Medication Sig Start Date End Date Taking? Authorizing Provider  ALPRAZolam (XANAX) 0.25 MG tablet Take 1 tablet (0.25 mg total) by mouth daily as needed. 01/13/20  Yes Mikey Kirschner, MD  amLODipine (NORVASC) 2.5 MG tablet Take 1 tablet (2.5 mg total) by mouth daily. 07/25/20  Yes Lovena Le, Malena M, DO  aspirin 81 MG tablet Take 81 mg by mouth daily.     Yes [provider]  escitalopram (LEXAPRO) 20 MG tablet Take 1.5 tablets (30 mg total) by mouth daily. 07/25/20  Yes Lovena Le, Malena M, DO  fluvastatin (LESCOL) 40 MG capsule Take 1 capsule (40 mg total) by mouth daily. 07/25/20  Yes Lovena Le, Malena M, DO  metoprolol succinate (TOPROL-XL) 25 MG 24 hr tablet TAKE (1) TABLET BY MOUTH AT BEDTIME. NEEDS OFFICE VISIT 07/25/20  Yes Lovena Le, Malena M, DO  nitroGLYCERIN (NITROSTAT) 0.4 MG SL tablet Dissolve 1 tablet under tongue every 5 mins up to  3 dose in 15 mins for chest pain. If no relief call 911. 11/26/19  Yes Satira Sark, MD  ramipril (ALTACE) 5 MG capsule Take 1 capsule (5 mg total) by mouth 2 (two) times daily. 07/25/20  Yes Lovena Le, Malena M, DO  traMADol (ULTRAM) 50 MG tablet Take 50 mg by mouth every 6 (six) hours as needed for moderate pain.   Yes [provider]  ezetimibe (ZETIA) 10 MG tablet TAKE ONE TABLET BY MOUTH DAILY. Patient not taking: Reported on 07/31/2020 01/17/20   Satira Sark, MD  glucose blood (ONE TOUCH ULTRA TEST) test strip USE AS DIRECTED ONCE DAILY. 07/22/19   Mikey Kirschner, MD    Scheduled Meds: . ALPRAZolam  0.25 mg Oral QHS  . amLODipine  2.5 mg Oral  Daily  . aspirin EC  81 mg Oral Daily  . escitalopram  30 mg Oral Daily  . metoprolol succinate  25 mg Oral Daily  . ramipril  5 mg Oral BID   Continuous Infusions: . sodium chloride 75 mL/hr at 08/01/20 0620   PRN Meds:.acetaminophen **OR** acetaminophen, ondansetron **OR** ondansetron (ZOFRAN) IV, polyethylene glycol     Results for orders placed or performed during the hospital encounter of 07/31/20 (from the past 48 hour(s))  Urinalysis, Routine w reflex microscopic Urine, Catheterized     Status: Abnormal   Collection Time: 07/31/20  2:30 PM  Result Value Ref Range   Color, Urine YELLOW YELLOW   APPearance CLEAR CLEAR   Specific Gravity, Urine 1.020 1.005 - 1.030   pH 5.0 5.0 - 8.0   Glucose, UA NEGATIVE NEGATIVE mg/dL   Hgb urine dipstick SMALL (A) NEGATIVE   Bilirubin Urine NEGATIVE NEGATIVE   Ketones, ur NEGATIVE NEGATIVE mg/dL   Protein, ur NEGATIVE NEGATIVE mg/dL   Nitrite NEGATIVE NEGATIVE   Leukocytes,Ua NEGATIVE NEGATIVE   RBC / HPF 6-10 0 - 5 RBC/hpf   WBC, UA 0-5 0 - 5 WBC/hpf   Bacteria, UA FEW (A) NONE SEEN   Mucus PRESENT     Comment: Performed at [facility], [address], [place], [place] [address]  CBC with Differential     Status: Abnormal   Collection Time: 07/31/20  2:32 PM  Result  Value Ref Range   WBC 7.7 4.0 - 10.5 K/uL   RBC 3.79 (L) 3.87 - 5.11 MIL/uL   Hemoglobin 11.8 (L) 12.0 - 15.0 g/dL   HCT 35.9 (L) 36 - 46 %   MCV 94.7 80.0 - 100.0 fL   MCH 31.1 26.0 - 34.0 pg   MCHC 32.9 30.0 - 36.0 g/dL   RDW 11.7 11.5 - 15.5 %   Platelets 256 150 - 400 K/uL   nRBC 0.0 0.0 - 0.2 %   Neutrophils Relative % 72 %   Neutro Abs 5.5 1.7 - 7.7 K/uL   Lymphocytes Relative 15 %   Lymphs Abs 1.2 0.7 - 4.0 K/uL   Monocytes Relative 9 %   Monocytes Absolute 0.7 0.1 - 1.0 K/uL   Eosinophils Relative 3 %   Eosinophils Absolute 0.2 0.0 - 0.5 K/uL   Basophils Relative 1 %   Basophils Absolute 0.1 0.0 - 0.1 K/uL   Immature Granulocytes 0 %   Abs Immature Granulocytes 0.01 0.00 - 0.07 K/uL    Comment: Performed at [facility], [address] [place], [place] [address]  Comprehensive metabolic panel     Status: Abnormal   Collection Time: 07/31/20  2:32 PM  Result Value Ref Range   Sodium 133 (L) 135 - 145 mmol/L   Potassium 4.2 3.5 - 5.1 mmol/L   Chloride 99 98 - 111 mmol/L   CO2 25 22 - 32 mmol/L   Glucose, Bld 143 (H) 70 - 99 mg/dL    Comment: Glucose reference range applies only to samples taken after fasting for at least 8 hours.   BUN 17 8 - 23 mg/dL   Creatinine, Ser 1.05 (H) 0.44 - 1.00 mg/dL   Calcium 9.1 8.9 - 10.3 mg/dL   Total Protein 6.8 6.5 - 8.1 g/dL   Albumin 3.6 3.5 - 5.0 g/dL   AST 18 15 - 41 U/L   ALT 13 0 - 44 U/L   Alkaline Phosphatase 70 38 - 126 U/L   Total Bilirubin 0.9 0.3 -  1.2 mg/dL   GFR, Estimated 52 (L) >60 mL/min    Comment: (NOTE) Calculated using the CKD-EPI Creatinine Equation (2021)    Anion gap 9 5 - 15    Comment: Performed at [facility], [address] [place], [place] [address]  Lactic acid, plasma     Status: None   Collection Time: 07/31/20  2:32 PM  Result Value Ref Range   Lactic Acid, Venous 1.3 0.5 - 1.9 mmol/L    Comment: Performed at [facility], [address], [place], [place] [address]  Protime-INR     Status:  None   Collection Time: 07/31/20  2:32 PM  Result Value Ref Range   Prothrombin Time 13.1 11.4 - 15.2 seconds   INR 1.0 0.8 - 1.2    Comment: (NOTE) INR goal varies based on device and disease states. Performed at [facility], [address], [place], [place] [address]   Ammonia     Status: None   Collection Time: 07/31/20  3:23 PM  Result Value Ref Range   Ammonia 9 9 - 35 umol/L    Comment: Performed at [facility], [address], [place], [place] [address]  CBG monitoring, ED     Status: Abnormal   Collection Time: 07/31/20  3:29 PM  Result Value Ref Range   Glucose-Capillary 115 (H) 70 - 99 mg/dL    Comment: Glucose reference range applies only to samples taken after fasting for at least 8 hours.  Resp Panel by RT-PCR (Flu A&B, Covid) Nasopharyngeal Swab     Status: None   Collection Time: 07/31/20  5:04 PM   Specimen: Nasopharyngeal Swab; Nasopharyngeal(NP) swabs in vial transport medium  Result Value Ref Range   SARS Coronavirus 2 by RT PCR NEGATIVE NEGATIVE    Comment: (NOTE) SARS-CoV-2 target nucleic acids are NOT DETECTED.  The SARS-CoV-2 RNA is generally detectable in upper respiratory specimens during the acute phase of infection. The lowest concentration of SARS-CoV-2 viral copies this assay can detect is 138 copies/mL. A negative result does not preclude SARS-Cov-2 infection and should not be used as the sole basis for treatment or other patient management decisions. A negative result may occur with  improper specimen collection/handling, submission of specimen other than nasopharyngeal swab, presence of viral mutation(s) within the areas targeted by this assay, and inadequate number of viral copies(<138 copies/mL). A negative result must be combined with clinical observations, patient history, and epidemiological information. The expected result is Negative.  Fact Sheet for Patients:  EntrepreneurPulse.com.au  Fact Sheet for Healthcare  Providers:  IncredibleEmployment.be  This test is no t yet approved or cleared by the [place] FDA and  has been authorized for detection and/or diagnosis of SARS-CoV-2 by FDA under an Emergency Use Authorization (EUA). This EUA will remain  in effect (meaning this test can be used) for the duration of the COVID-19 declaration under Section 564(b)(1) of the Act, 21 U.S.C.section 360bbb-3(b)(1), unless the authorization is terminated  or revoked sooner.       Influenza A by PCR NEGATIVE NEGATIVE   Influenza B by PCR NEGATIVE NEGATIVE    Comment: (NOTE) The Xpert Xpress SARS-CoV-2/FLU/RSV plus assay is intended as an aid in the diagnosis of influenza from Nasopharyngeal swab specimens and should not be used as a sole basis for treatment. Nasal washings and aspirates are unacceptable for Xpert Xpress SARS-CoV-2/FLU/RSV testing.  Fact Sheet for Patients: EntrepreneurPulse.com.au  Fact Sheet for Healthcare Providers: IncredibleEmployment.be  This test is not yet approved or  cleared by the [place] and has been authorized for detection and/or diagnosis of SARS-CoV-2 by FDA under an Emergency Use Authorization (EUA). This EUA will remain in effect (meaning this test can be used) for the duration of the COVID-19 declaration under Section 564(b)(1) of the Act, 21 U.S.C. section 360bbb-3(b)(1), unless the authorization is terminated or revoked.  Performed at [facility], [address] [place], [place] [address]   Vitamin B12     Status: None   Collection Time: 08/01/20  4:27 AM  Result Value Ref Range   Vitamin B-12 390 180 - 914 pg/mL    Comment: (NOTE) This assay is not validated for testing neonatal or myeloproliferative syndrome specimens for Vitamin B12 levels. Performed at [facility], [address] [place], [place] [address]   Folate, serum, performed at [facility] lab     Status: None   Collection  Time: 08/01/20  4:27 AM  Result Value Ref Range   Folate 17.0 >5.9 ng/mL    Comment: Performed at [facility], [address] [place], [place] [address]  TSH     Status: None   Collection Time: 08/01/20  4:27 AM  Result Value Ref Range   TSH 3.052 0.350 - 4.500 uIU/mL    Comment: Performed by a 3rd Generation assay with a functional sensitivity of <=0.01 uIU/mL. Performed at [facility], [address] [place], [place] [address]   CBG monitoring, ED     Status: Abnormal   Collection Time: 08/01/20  7:33 AM  Result Value Ref Range   Glucose-Capillary 119 (H) 70 - 99 mg/dL    Comment: Glucose reference range applies only to samples taken after fasting for at least 8 hours.   Comment 1 Notify RN     Studies/Results: BRAIN MRI  FINDINGS: Brain: Limited study.  Patient not able to complete the study.  Moderate to advanced atrophy with progression. Progressive white matter changes compatible with chronic microvascular ischemia. Chronic hemorrhage right thalamus unchanged. Chronic hemorrhage left thalamus is new since the prior study. Negative for mass lesion.  Negative for acute infarct.  Vascular: Normal arterial flow voids  Skull and upper cervical spine: No focal skeletal abnormality.  Sinuses/Orbits: Mild mucosal edema paranasal sinuses. Negative orbit  Other: None  IMPRESSION: No acute abnormality  Progression of atrophy and chronic microhemorrhage since 2012.  Chronic hemorrhage in the thalamus bilaterally. Correlate with hypertension history.   The brain MRI scan is reviewed impression and shows moderate confluent leukoencephalopathy.  No acute changes are noted on DWI.  There is mild ventriculomegaly somewhat out of proportion to the degree of atrophy.  There is evidence of reduced signal indicating microhemorrhage involving the thalami bilaterally more on the right side which also extends to the centrum semiovale.     Chrisette Man A. Merlene Proctor, M.D.    Diplomate, Tax adviser of Psychiatry and Neurology ( Neurology). 08/01/2020, 8:11 AM        [name], M.D.  Diplomate, Tax adviser of Psychiatry and Neurology ( Neurology). 08/01/2020, 8:11 AM

## 2020-08-01 NOTE — TOC Progression Note (Signed)
Transition of Care [facility]) - Progression Note   Patient Details  Name: [name] MRN: [id number] Date of Birth: July 17, 1934  Transition of Care [facility]) CM/SW [place], LCSW Phone Number: 08/01/2020, 3:14 PM  Clinical Narrative: [facility] to review referral provided patient has been vaccinated for COVID. CSW spoke with patient's husband, who confirmed patient has been vaccinated. Husband to find vaccination card. PNC made bed offer. TOC to follow.  Expected Discharge Plan: [place] Barriers to Discharge: Continued Medical Work up  Expected Discharge Plan and Services Expected Discharge Plan: [place] In-house Referral: Clinical Social Work Post Acute Care Choice: [place] arrangements for the past 2 months: Single Family Home              DME Arranged: N/A DME Agency: NA HH Arranged: NA [place] Agency: NA  Readmission Risk Interventions No flowsheet data found.

## 2020-08-01 NOTE — Progress Notes (Signed)
PROGRESS NOTE    [name]  YQM:[id number] DOB: 01-14-1934 DOA: 07/31/2020 PCP: Erven Colla, DO   Brief Narrative: [name] is a 86 y.o. female with a history of diabetes mellitus, hypertension, stroke.  Patient with no previously known history of dementia.  Patient presented secondary to increasing confusion with associated incontinence and significantly worsening gait.   Assessment & Plan:   Principal Problem:   AMS (altered mental status) Active Problems:   Essential hypertension, benign   Stroke (HCC)   Type 2 diabetes mellitus (HCC)   Diabetic retinopathy ([place])   Confusion CT scan with possible suggestion of NPH.  MRI obtained and was significant for no acute abnormalities but with evidence of chronic microhemorrhage with a chronic hemorrhage located in the thalamus bilaterally.  Neurology consulted on admission. -PT/OT recommendations  Essential hypertension -Continue ramipril 5 mg twice daily, amlodipine 2.5 mg daily and metoprolol succinate daily  Diabetes mellitus, type II Diabetic retinopathy Last hemoglobin A1c of 5.9%.  Patient is currently diet controlled as an outpatient.  History of stroke -Continue aspirin -Neurology recommendations -Lipid panel  Depression/anxiety -Continue Xanax and Lexapro   DVT prophylaxis: SCD Code Status:   Code Status: Full Code Family Communication: Daughter at bedside Disposition Plan: Discharge home versus SNF pending PT recommendations in addition to neurology recommendations likely in 24 to 48 hours   Consultants:   Neurology  Procedures:   None  Antimicrobials:  None   Subjective: No current issues but does report having recent issues with memory  Objective: Vitals:   08/01/20 0530 08/01/20 0600 08/01/20 0615 08/01/20 0630  BP: (!) 146/59 (!) 144/53  (!) 142/56  Pulse: (!) 53   (!) 54  Resp:    18  Temp:      TempSrc:      SpO2: 94%  95% 96%  Weight:      Height:         No intake or output data in the 24 hours ending 08/01/20 0709 Filed Weights   07/31/20 1354  Weight: 65.8 kg    Examination:  General exam: Appears calm and comfortable  Respiratory system: Clear to auscultation. Respiratory effort normal. Cardiovascular system: S1 & S2 heard, RRR. No murmurs, rubs, gallops or clicks. Gastrointestinal system: Abdomen is nondistended, soft and nontender. No organomegaly or masses felt. Normal bowel sounds heard. Central nervous system: Alert and oriented to person and place. No focal neurological deficits. Musculoskeletal: No edema. No calf tenderness Skin: No cyanosis. No rashes Psychiatry: Judgement and insight appear normal. Mood & affect appropriate.     Data Reviewed: I have personally reviewed following labs and imaging studies  CBC Lab Results  Component Value Date   WBC 7.7 07/31/2020   RBC 3.79 (L) 07/31/2020   HGB 11.8 (L) 07/31/2020   HCT 35.9 (L) 07/31/2020   MCV 94.7 07/31/2020   MCH 31.1 07/31/2020   PLT 256 07/31/2020   MCHC 32.9 07/31/2020   RDW 11.7 07/31/2020   LYMPHSABS 1.2 07/31/2020   MONOABS 0.7 07/31/2020   EOSABS 0.2 07/31/2020   BASOSABS 0.1 52/84/1324     Last metabolic panel Lab Results  Component Value Date   NA 133 (L) 07/31/2020   K 4.2 07/31/2020   CL 99 07/31/2020   CO2 25 07/31/2020   BUN 17 07/31/2020   CREATININE 1.05 (H) 07/31/2020   GLUCOSE 143 (H) 07/31/2020   GFRNONAA 52 (L) 07/31/2020   GFRAA 63 07/25/2020   CALCIUM 9.1 07/31/2020  PHOS 4.8 (H) 12/20/2010   PROT 6.8 07/31/2020   ALBUMIN 3.6 07/31/2020   LABGLOB 2.6 07/25/2020   AGRATIO 1.7 07/25/2020   BILITOT 0.9 07/31/2020   ALKPHOS 70 07/31/2020   AST 18 07/31/2020   ALT 13 07/31/2020   ANIONGAP 9 07/31/2020    CBG (last 3)  Recent Labs    07/31/20 1529  GLUCAP 115*     GFR: Estimated Creatinine Clearance: 34.6 mL/min (A) (by C-G formula based on SCr of 1.05 mg/dL (H)).  Coagulation Profile: Recent Labs  Lab  07/31/20 1432  INR 1.0    Recent Results (from the past 240 hour(s))  Resp Panel by RT-PCR (Flu A&B, Covid) Nasopharyngeal Swab     Status: None   Collection Time: 07/31/20  5:04 PM   Specimen: Nasopharyngeal Swab; Nasopharyngeal(NP) swabs in vial transport medium  Result Value Ref Range Status   SARS Coronavirus 2 by RT PCR NEGATIVE NEGATIVE Final    Comment: (NOTE) SARS-CoV-2 target nucleic acids are NOT DETECTED.  The SARS-CoV-2 RNA is generally detectable in upper respiratory specimens during the acute phase of infection. The lowest concentration of SARS-CoV-2 viral copies this assay can detect is 138 copies/mL. A negative result does not preclude SARS-Cov-2 infection and should not be used as the sole basis for treatment or other patient management decisions. A negative result may occur with  improper specimen collection/handling, submission of specimen other than nasopharyngeal swab, presence of viral mutation(s) within the areas targeted by this assay, and inadequate number of viral copies(<138 copies/mL). A negative result must be combined with clinical observations, patient history, and epidemiological information. The expected result is Negative.  Fact Sheet for Patients:  EntrepreneurPulse.com.au  Fact Sheet for Healthcare Providers:  IncredibleEmployment.be  This test is no t yet approved or cleared by the [place] FDA and  has been authorized for detection and/or diagnosis of SARS-CoV-2 by FDA under an Emergency Use Authorization (EUA). This EUA will remain  in effect (meaning this test can be used) for the duration of the COVID-19 declaration under Section 564(b)(1) of the Act, 21 U.S.C.section 360bbb-3(b)(1), unless the authorization is terminated  or revoked sooner.       Influenza A by PCR NEGATIVE NEGATIVE Final   Influenza B by PCR NEGATIVE NEGATIVE Final    Comment: (NOTE) The Xpert Xpress SARS-CoV-2/FLU/RSV  plus assay is intended as an aid in the diagnosis of influenza from Nasopharyngeal swab specimens and should not be used as a sole basis for treatment. Nasal washings and aspirates are unacceptable for Xpert Xpress SARS-CoV-2/FLU/RSV testing.  Fact Sheet for Patients: EntrepreneurPulse.com.au  Fact Sheet for Healthcare Providers: IncredibleEmployment.be  This test is not yet approved or cleared by the [place] FDA and has been authorized for detection and/or diagnosis of SARS-CoV-2 by FDA under an Emergency Use Authorization (EUA). This EUA will remain in effect (meaning this test can be used) for the duration of the COVID-19 declaration under Section 564(b)(1) of the Act, 21 U.S.C. section 360bbb-3(b)(1), unless the authorization is terminated or revoked.  Performed at [facility], [address] [place], [place] [address]         Radiology Studies: DG Chest 1 View  Result Date: 07/31/2020 CLINICAL DATA:  Weakness. EXAM: CHEST  1 VIEW COMPARISON:  December 19, 2010. FINDINGS: The heart size and mediastinal contours are within normal limits. Both lungs are clear. The visualized skeletal structures are unremarkable. IMPRESSION: No active disease. Electronically Signed   By: Bobbe Medico.D.  On: 07/31/2020 15:09   CT Head Wo Contrast  Result Date: 07/31/2020 CLINICAL DATA:  Mental status change, unknown cause. Weakness, confusion per family which began today, denies injury. EXAM: CT HEAD WITHOUT CONTRAST TECHNIQUE: Contiguous axial images were obtained from the base of the skull through the vertex without intravenous contrast. COMPARISON:  Brain MRI 04/03/2011.  Head CT 12/20/2010. FINDINGS: Brain: Mild-to-moderate generalized cerebral atrophy. Moderate ill-defined hypoattenuation within the cerebral white matter is nonspecific, but compatible chronic small vessel ischemic disease. Lateral and third ventriculomegaly is at least partially  related to cerebral volume loss. However, there is prominence of the sylvian fissures bilaterally. Additionally, there is crowding of the cerebral sulci along the high cerebral convexities and a decreased callosal angle. These findings may be seen in the setting of normal pressure hydrocephalus. There is no acute intracranial hemorrhage. No demarcated cortical infarct. No extra-axial fluid collection. No evidence of intracranial mass. No midline shift. Vascular: No hyperdense vessel.  Atherosclerotic calcifications. Skull: Normal. Negative for fracture or focal lesion. Sinuses/Orbits: Visualized orbits show no acute finding. Mild ethmoid sinus mucosal thickening. IMPRESSION: No evidence of acute intracranial hemorrhage or acute infarction. Mild lateral and third ventriculomegaly is at least partially related to cerebral volume loss. However, there are also some imaging findings which may be seen in the setting of normal pressure hydrocephalus and clinical correlation is recommended. Moderate cerebral white matter chronic small vessel ischemic disease. Electronically Signed   By: Kellie Simmering DO   On: 07/31/2020 15:43   CT Cervical Spine Wo Contrast  Result Date: 07/31/2020 CLINICAL DATA:  Trauma. EXAM: CT CERVICAL SPINE WITHOUT CONTRAST TECHNIQUE: Multidetector CT imaging of the cervical spine was performed without intravenous contrast. Multiplanar CT image reconstructions were also generated. COMPARISON:  None. FINDINGS: Alignment: Straightening of the normal cervical lordosis. Mild stepwise degenerative retrolisthesis of C5 on C6 and C6 on C7. Skull base and vertebrae: No evidence of acute fracture. Vertebral body heights are maintained. Osteopenia. Soft tissues and spinal canal: No prevertebral fluid or swelling. No visible canal hematoma. Disc levels: Multilevel degenerative disc disease with disc height loss and posterior spurring. There is a left eccentric posterior disc osteophyte complex at C6-C7 which  results in at least mild canal stenosis and at least moderate left foraminal stenosis. Multilevel facet and uncovertebral hypertrophy with multilevel suspected moderate to severe foraminal stenosis. Possibly severe foraminal stenosis on the right at C5-C6. Upper chest: Negative. IMPRESSION: 1. No evidence of acute fracture or traumatic malalignment. 2. Multilevel degenerative changes, as detailed above. Suspected multilevel moderate to severe foraminal stenosis. MRI could better characterize if clinically indicated. Electronically Signed   By: Margaretha Sheffield MD   On: 07/31/2020 15:42   MR BRAIN WO CONTRAST  Result Date: 07/31/2020 CLINICAL DATA:  Altered mental status EXAM: MRI HEAD WITHOUT CONTRAST TECHNIQUE: Multiplanar, multiecho pulse sequences of the brain and surrounding structures were obtained without intravenous contrast. COMPARISON:  MRI head 04/03/2011 FINDINGS: Brain: Limited study.  Patient not able to complete the study. Moderate to advanced atrophy with progression. Progressive white matter changes compatible with chronic microvascular ischemia. Chronic hemorrhage right thalamus unchanged. Chronic hemorrhage left thalamus is new since the prior study. Negative for mass lesion. Negative for acute infarct. Vascular: Normal arterial flow voids Skull and upper cervical spine: No focal skeletal abnormality. Sinuses/Orbits: Mild mucosal edema paranasal sinuses. Negative orbit Other: None IMPRESSION: No acute abnormality Progression of atrophy and chronic microhemorrhage since 2012. Chronic hemorrhage in the thalamus bilaterally. Correlate with hypertension history. Electronically Signed  By: Franchot Gallo M.D.   On: 07/31/2020 18:59   DG Knee Complete 4 Views Right  Result Date: 07/31/2020 CLINICAL DATA:  Right knee pain without known injury. EXAM: RIGHT KNEE - COMPLETE 4+ VIEW COMPARISON:  None. FINDINGS: No evidence of fracture, dislocation, or joint effusion. Severe narrowing of medial  joint space is noted. Soft tissues are unremarkable. IMPRESSION: Severe degenerative joint disease. No acute abnormality seen in the right knee. Electronically Signed   By: Marijo Conception M.D.   On: 07/31/2020 15:09        Scheduled Meds: . ALPRAZolam  0.25 mg Oral QHS  . amLODipine  2.5 mg Oral Daily  . aspirin EC  81 mg Oral Daily  . escitalopram  30 mg Oral Daily  . metoprolol succinate  25 mg Oral Daily  . ramipril  5 mg Oral BID   Continuous Infusions: . sodium chloride 75 mL/hr at 08/01/20 0620     LOS: 0 days     Cordelia Poche, MD [facility] 08/01/2020, 7:09 AM  If 7PM-7AM, please contact night-coverage [email or web address]

## 2020-08-01 NOTE — Plan of Care (Signed)
  Problem: Acute Rehab PT Goals(only PT should resolve) Goal: Pt Will Go Supine/Side To Sit Outcome: Progressing Flowsheets (Taken 08/01/2020 1022) Pt will go Supine/Side to Sit: with min guard assist Goal: Patient Will Transfer Sit To/From Stand Outcome: Progressing Flowsheets (Taken 08/01/2020 1022) Patient will transfer sit to/from stand:  with min guard assist  with minimal assist Goal: Pt Will Transfer Bed To Chair/Chair To Bed Outcome: Progressing Flowsheets (Taken 08/01/2020 1022) Pt will Transfer Bed to Chair/Chair to Bed: with min assist Goal: Pt Will Ambulate Outcome: Progressing Flowsheets (Taken 08/01/2020 1022) Pt will Ambulate:  25 feet  with minimal assist  with rolling walker   10:22 AM, 08/01/20 Lonell Grandchild, MPT Physical Therapist with [facility] [phone] office [phone] mobile phone

## 2020-08-01 NOTE — NC FL2 (Signed)
[place] MEDICAID FL2 LEVEL OF CARE SCREENING TOOL     IDENTIFICATION  Patient Name: [name] Birthdate: 1934-04-19 Sex: female Admission Date (Current Location): 07/31/2020  [facility] and [place] Number:  [employer] and Address:  [facility] [address] [place]      Provider Number: [id number]  Attending Physician Name and Address:  Mariel Aloe, MD  Relative Name and Phone Number:       Current Level of Care: Hospital Recommended Level of Care: [place] Prior Approval Number:    Date Approved/Denied:   PASRR Number: [id number]  Discharge Plan: SNF    Current Diagnoses: Patient Active Problem List   Diagnosis Date Noted  . AMS (altered mental status) 07/31/2020  . Osteoarthritis of right knee 04/14/2020  . MCI (mild cognitive impairment) 01/09/2016  . History of stroke 01/09/2016  . Acute blood loss anemia   . First degree hemorrhoids   . Mucosal abnormality of stomach   . Rectal bleed 09/30/2014  . BRBPR (bright red blood per rectum) 09/30/2014  . Type 2 diabetes mellitus ([place]) 03/16/2013  . Diabetic retinopathy ([place]) 03/16/2013  . Stroke ([place]) 01/24/2011  . Coronary atherosclerosis of native coronary artery 12/13/2010  . Mixed hyperlipidemia 11/05/2010  . Essential hypertension, benign 11/05/2010  . GERD 11/05/2010    Orientation RESPIRATION BLADDER Height & Weight     Self, Place, Situation  Normal External catheter Weight: 145 lb 1 oz (65.8 kg) Height:  5\' 1"  (154.9 cm)  BEHAVIORAL SYMPTOMS/MOOD NEUROLOGICAL BOWEL NUTRITION STATUS      Incontinent Diet (heart healthy/carb modified. See d/c summary for updates.)  AMBULATORY STATUS COMMUNICATION OF NEEDS Skin   Extensive Assist Verbally Normal                       Personal Care Assistance Level of Assistance  Bathing, Dressing, Feeding Bathing Assistance: Maximum assistance Feeding assistance: Limited assistance Dressing  Assistance: Maximum assistance     Functional Limitations Info  Sight, Hearing, Speech Sight Info: Adequate Hearing Info: Adequate Speech Info: Adequate    SPECIAL CARE FACTORS FREQUENCY  PT (By licensed PT)     PT Frequency: 5x weekly              Contractures      Additional Factors Info  Psychotropic     Psychotropic Info: Xanax, Lexapro         Current Medications (08/01/2020):  This is the current hospital active medication list Current Facility-Administered Medications  Medication Dose Route Frequency Provider Last Rate Last Admin  . 0.9 %  sodium chloride infusion   Intravenous Continuous Emokpae, Ejiroghene E, MD 75 mL/hr at 08/01/20 0620 Rate Verify at 08/01/20 0620  . acetaminophen (TYLENOL) tablet 650 mg  650 mg Oral Q6H PRN Emokpae, Ejiroghene E, MD       Or  . acetaminophen (TYLENOL) suppository 650 mg  650 mg Rectal Q6H PRN Emokpae, Ejiroghene E, MD      . ALPRAZolam Duanne Moron) tablet 0.25 mg  0.25 mg Oral QHS Emokpae, Ejiroghene E, MD   0.25 mg at 07/31/20 2147  . amLODipine (NORVASC) tablet 2.5 mg  2.5 mg Oral Daily Emokpae, Ejiroghene E, MD   2.5 mg at 07/31/20 2147  . aspirin EC tablet 81 mg  81 mg Oral Daily Emokpae, Ejiroghene E, MD   81 mg at 07/31/20 2148  . escitalopram (LEXAPRO) tablet 30 mg  30 mg Oral Daily  Bethena Roys, MD   30 mg at 07/31/20 2148  . metoprolol succinate (TOPROL-XL) 24 hr tablet 25 mg  25 mg Oral Daily Emokpae, Ejiroghene E, MD   25 mg at 07/31/20 2148  . ondansetron (ZOFRAN) tablet 4 mg  4 mg Oral Q6H PRN Emokpae, Ejiroghene E, MD       Or  . ondansetron (ZOFRAN) injection 4 mg  4 mg Intravenous Q6H PRN Emokpae, Ejiroghene E, MD      . polyethylene glycol (MIRALAX / GLYCOLAX) packet 17 g  17 g Oral Daily PRN Emokpae, Ejiroghene E, MD      . ramipril (ALTACE) capsule 5 mg  5 mg Oral BID Emokpae, Ejiroghene E, MD         Discharge Medications: Please see discharge summary for a list of discharge  medications.  Relevant Imaging Results:  Relevant Lab Results:   Additional Information SSN: [id number]. Pt's daughter reports pt has had vaccines and booster.  Salome Arnt, LCSW

## 2020-08-01 NOTE — ED Notes (Signed)
ED TO INPATIENT HANDOFF REPORT  ED Nurse Name and Phone #: [phone]  S Name/Age/Gender [name] 86 y.o. female Room/Bed: APA06/APA06  Code Status   Code Status: Full Code  Home/SNF/Other Home Patient oriented to: self and place Is this baseline? Yes   Triage Complete: Triage complete  Chief Complaint AMS (altered mental status) [R41.82]  Triage Note Patient from home, family concerned because she has been slightly confused and urinated on herself today which is not normal for her. Pt states she feels very weak.     Allergies Allergies  Allergen Reactions  . Macrodantin   . Pravachol [Pravastatin Sodium]   . Tetanus Toxoids     Level of Care/Admitting Diagnosis ED Disposition    ED Disposition Condition [facility] Area: [facility] [[id number]]  Level of Care: Telemetry [5]  Covid Evaluation: Asymptomatic Screening Protocol (No Symptoms)  Diagnosis: AMS (altered mental status) [7673419]  Admitting Physician: Bethena Roys [[id number]]  Attending Physician: Bethena Roys Nessa.Cuff       B Medical/Surgery History Past Medical History:  Diagnosis Date  . Allergic rhinitis   . Cataracts, bilateral   . Coronary atherosclerosis of native coronary artery    DES LAD 3/12, 99% nondominant RCA, No CAD otherwise, LVEF 65%  . Diabetic retinopathy ([place])   . Diverticulitis   . Essential hypertension   . GERD (gastroesophageal reflux disease)   . Hemorrhoids   . Hyperlipidemia   . Stroke, hemorrhagic ([place])    Right thalamic hemorrhage 4/12  . Syncope    Neurally mediated  . Type 2 diabetes mellitus ([place])    Past Surgical History:  Procedure Laterality Date  . BREAST CYST EXCISION    . CARDIAC CATHETERIZATION    . CAROTID STENT  11/16/10  . COLONOSCOPY N/A 10/02/2014   Dr. Rourk:engorged internal hemorrhoids/pancolonic diverticulosis  . ESOPHAGOGASTRODUODENOSCOPY  10/02/14   Dr. Rourk:normal esophagus 2 cm hiatal hernia,  gastric erosions likely from NSAID effect     A IV Location/Drains/Wounds Patient Lines/Drains/Airways Status    Active Line/Drains/Airways    Name Placement date Placement time Site Days   Peripheral IV 07/31/20 Left Forearm 07/31/20  1424  Forearm  1          Intake/Output Last 24 hours No intake or output data in the 24 hours ending 08/01/20 2409  Labs/Imaging Results for orders placed or performed during the hospital encounter of 07/31/20 (from the past 48 hour(s))  Urinalysis, Routine w reflex microscopic Urine, Catheterized     Status: Abnormal   Collection Time: 07/31/20  2:30 PM  Result Value Ref Range   Color, Urine YELLOW YELLOW   APPearance CLEAR CLEAR   Specific Gravity, Urine 1.020 1.005 - 1.030   pH 5.0 5.0 - 8.0   Glucose, UA NEGATIVE NEGATIVE mg/dL   Hgb urine dipstick SMALL (A) NEGATIVE   Bilirubin Urine NEGATIVE NEGATIVE   Ketones, ur NEGATIVE NEGATIVE mg/dL   Protein, ur NEGATIVE NEGATIVE mg/dL   Nitrite NEGATIVE NEGATIVE   Leukocytes,Ua NEGATIVE NEGATIVE   RBC / HPF 6-10 0 - 5 RBC/hpf   WBC, UA 0-5 0 - 5 WBC/hpf   Bacteria, UA FEW (A) NONE SEEN   Mucus PRESENT     Comment: Performed at [facility], [address] [place], [place] [address]  CBC with Differential     Status: Abnormal   Collection Time: 07/31/20  2:32 PM  Result Value Ref Range   WBC 7.7 4.0 -  10.5 K/uL   RBC 3.79 (L) 3.87 - 5.11 MIL/uL   Hemoglobin 11.8 (L) 12.0 - 15.0 g/dL   HCT 35.9 (L) 36 - 46 %   MCV 94.7 80.0 - 100.0 fL   MCH 31.1 26.0 - 34.0 pg   MCHC 32.9 30.0 - 36.0 g/dL   RDW 11.7 11.5 - 15.5 %   Platelets 256 150 - 400 K/uL   nRBC 0.0 0.0 - 0.2 %   Neutrophils Relative % 72 %   Neutro Abs 5.5 1.7 - 7.7 K/uL   Lymphocytes Relative 15 %   Lymphs Abs 1.2 0.7 - 4.0 K/uL   Monocytes Relative 9 %   Monocytes Absolute 0.7 0.1 - 1.0 K/uL   Eosinophils Relative 3 %   Eosinophils Absolute 0.2 0.0 - 0.5 K/uL   Basophils Relative 1 %   Basophils Absolute 0.1 0.0 - 0.1 K/uL    Immature Granulocytes 0 %   Abs Immature Granulocytes 0.01 0.00 - 0.07 K/uL    Comment: Performed at [facility], [address], [place], [place] [address]  Comprehensive metabolic panel     Status: Abnormal   Collection Time: 07/31/20  2:32 PM  Result Value Ref Range   Sodium 133 (L) 135 - 145 mmol/L   Potassium 4.2 3.5 - 5.1 mmol/L   Chloride 99 98 - 111 mmol/L   CO2 25 22 - 32 mmol/L   Glucose, Bld 143 (H) 70 - 99 mg/dL    Comment: Glucose reference range applies only to samples taken after fasting for at least 8 hours.   BUN 17 8 - 23 mg/dL   Creatinine, Ser 1.05 (H) 0.44 - 1.00 mg/dL   Calcium 9.1 8.9 - 10.3 mg/dL   Total Protein 6.8 6.5 - 8.1 g/dL   Albumin 3.6 3.5 - 5.0 g/dL   AST 18 15 - 41 U/L   ALT 13 0 - 44 U/L   Alkaline Phosphatase 70 38 - 126 U/L   Total Bilirubin 0.9 0.3 - 1.2 mg/dL   GFR, Estimated 52 (L) >60 mL/min    Comment: (NOTE) Calculated using the CKD-EPI Creatinine Equation (2021)    Anion gap 9 5 - 15    Comment: Performed at [facility], [address], [place], [place] [address]  Lactic acid, plasma     Status: None   Collection Time: 07/31/20  2:32 PM  Result Value Ref Range   Lactic Acid, Venous 1.3 0.5 - 1.9 mmol/L    Comment: Performed at [facility], [address] [place], [place] [address]  Protime-INR     Status: None   Collection Time: 07/31/20  2:32 PM  Result Value Ref Range   Prothrombin Time 13.1 11.4 - 15.2 seconds   INR 1.0 0.8 - 1.2    Comment: (NOTE) INR goal varies based on device and disease states. Performed at [facility], [address], [place], [place] [address]   Ammonia     Status: None   Collection Time: 07/31/20  3:23 PM  Result Value Ref Range   Ammonia 9 9 - 35 umol/L    Comment: Performed at [facility], [address] [place], [place] [address]  CBG monitoring, ED     Status: Abnormal   Collection Time: 07/31/20  3:29 PM  Result Value Ref Range   Glucose-Capillary 115 (H) 70 - 99 mg/dL    Comment:  Glucose reference range applies only to samples taken after fasting for at least 8 hours.  Resp  Panel by RT-PCR (Flu A&B, Covid) Nasopharyngeal Swab     Status: None   Collection Time: 07/31/20  5:04 PM   Specimen: Nasopharyngeal Swab; Nasopharyngeal(NP) swabs in vial transport medium  Result Value Ref Range   SARS Coronavirus 2 by RT PCR NEGATIVE NEGATIVE    Comment: (NOTE) SARS-CoV-2 target nucleic acids are NOT DETECTED.  The SARS-CoV-2 RNA is generally detectable in upper respiratory specimens during the acute phase of infection. The lowest concentration of SARS-CoV-2 viral copies this assay can detect is 138 copies/mL. A negative result does not preclude SARS-Cov-2 infection and should not be used as the sole basis for treatment or other patient management decisions. A negative result may occur with  improper specimen collection/handling, submission of specimen other than nasopharyngeal swab, presence of viral mutation(s) within the areas targeted by this assay, and inadequate number of viral copies(<138 copies/mL). A negative result must be combined with clinical observations, patient history, and epidemiological information. The expected result is Negative.  Fact Sheet for Patients:  EntrepreneurPulse.com.au  Fact Sheet for Healthcare Providers:  IncredibleEmployment.be  This test is no t yet approved or cleared by the [place] FDA and  has been authorized for detection and/or diagnosis of SARS-CoV-2 by FDA under an Emergency Use Authorization (EUA). This EUA will remain  in effect (meaning this test can be used) for the duration of the COVID-19 declaration under Section 564(b)(1) of the Act, 21 U.S.C.section 360bbb-3(b)(1), unless the authorization is terminated  or revoked sooner.       Influenza A by PCR NEGATIVE NEGATIVE   Influenza B by PCR NEGATIVE NEGATIVE    Comment: (NOTE) The Xpert Xpress SARS-CoV-2/FLU/RSV plus assay  is intended as an aid in the diagnosis of influenza from Nasopharyngeal swab specimens and should not be used as a sole basis for treatment. Nasal washings and aspirates are unacceptable for Xpert Xpress SARS-CoV-2/FLU/RSV testing.  Fact Sheet for Patients: EntrepreneurPulse.com.au  Fact Sheet for Healthcare Providers: IncredibleEmployment.be  This test is not yet approved or cleared by the [place] FDA and has been authorized for detection and/or diagnosis of SARS-CoV-2 by FDA under an Emergency Use Authorization (EUA). This EUA will remain in effect (meaning this test can be used) for the duration of the COVID-19 declaration under Section 564(b)(1) of the Act, 21 U.S.C. section 360bbb-3(b)(1), unless the authorization is terminated or revoked.  Performed at [facility], [address] [place], [place] [address]   Vitamin B12     Status: None   Collection Time: 08/01/20  4:27 AM  Result Value Ref Range   Vitamin B-12 390 180 - 914 pg/mL    Comment: (NOTE) This assay is not validated for testing neonatal or myeloproliferative syndrome specimens for Vitamin B12 levels. Performed at [facility], [address], [place], [place] [address]   Folate, serum, performed at [facility] lab     Status: None   Collection Time: 08/01/20  4:27 AM  Result Value Ref Range   Folate 17.0 >5.9 ng/mL    Comment: Performed at [facility], [address] [place], [place] [address]  TSH     Status: None   Collection Time: 08/01/20  4:27 AM  Result Value Ref Range   TSH 3.052 0.350 - 4.500 uIU/mL    Comment: Performed by a 3rd Generation assay with a functional sensitivity of <=0.01 uIU/mL. Performed at [facility], [address] [place], [place] [address]   CBG monitoring, ED     Status: Abnormal  Collection Time: 08/01/20  7:33 AM  Result Value Ref Range   Glucose-Capillary 119 (H) 70 - 99 mg/dL    Comment: Glucose reference range applies only to  samples taken after fasting for at least 8 hours.   Comment 1 Notify RN    DG Chest 1 View  Result Date: 07/31/2020 CLINICAL DATA:  Weakness. EXAM: CHEST  1 VIEW COMPARISON:  December 19, 2010. FINDINGS: The heart size and mediastinal contours are within normal limits. Both lungs are clear. The visualized skeletal structures are unremarkable. IMPRESSION: No active disease. Electronically Signed   By: Marijo Conception M.D.   On: 07/31/2020 15:09   CT Head Wo Contrast  Result Date: 07/31/2020 CLINICAL DATA:  Mental status change, unknown cause. Weakness, confusion per family which began today, denies injury. EXAM: CT HEAD WITHOUT CONTRAST TECHNIQUE: Contiguous axial images were obtained from the base of the skull through the vertex without intravenous contrast. COMPARISON:  Brain MRI 04/03/2011.  Head CT 12/20/2010. FINDINGS: Brain: Mild-to-moderate generalized cerebral atrophy. Moderate ill-defined hypoattenuation within the cerebral white matter is nonspecific, but compatible chronic small vessel ischemic disease. Lateral and third ventriculomegaly is at least partially related to cerebral volume loss. However, there is prominence of the sylvian fissures bilaterally. Additionally, there is crowding of the cerebral sulci along the high cerebral convexities and a decreased callosal angle. These findings may be seen in the setting of normal pressure hydrocephalus. There is no acute intracranial hemorrhage. No demarcated cortical infarct. No extra-axial fluid collection. No evidence of intracranial mass. No midline shift. Vascular: No hyperdense vessel.  Atherosclerotic calcifications. Skull: Normal. Negative for fracture or focal lesion. Sinuses/Orbits: Visualized orbits show no acute finding. Mild ethmoid sinus mucosal thickening. IMPRESSION: No evidence of acute intracranial hemorrhage or acute infarction. Mild lateral and third ventriculomegaly is at least partially related to cerebral volume loss. However,  there are also some imaging findings which may be seen in the setting of normal pressure hydrocephalus and clinical correlation is recommended. Moderate cerebral white matter chronic small vessel ischemic disease. Electronically Signed   By: Kellie Simmering DO   On: 07/31/2020 15:43   CT Cervical Spine Wo Contrast  Result Date: 07/31/2020 CLINICAL DATA:  Trauma. EXAM: CT CERVICAL SPINE WITHOUT CONTRAST TECHNIQUE: Multidetector CT imaging of the cervical spine was performed without intravenous contrast. Multiplanar CT image reconstructions were also generated. COMPARISON:  None. FINDINGS: Alignment: Straightening of the normal cervical lordosis. Mild stepwise degenerative retrolisthesis of C5 on C6 and C6 on C7. Skull base and vertebrae: No evidence of acute fracture. Vertebral body heights are maintained. Osteopenia. Soft tissues and spinal canal: No prevertebral fluid or swelling. No visible canal hematoma. Disc levels: Multilevel degenerative disc disease with disc height loss and posterior spurring. There is a left eccentric posterior disc osteophyte complex at C6-C7 which results in at least mild canal stenosis and at least moderate left foraminal stenosis. Multilevel facet and uncovertebral hypertrophy with multilevel suspected moderate to severe foraminal stenosis. Possibly severe foraminal stenosis on the right at C5-C6. Upper chest: Negative. IMPRESSION: 1. No evidence of acute fracture or traumatic malalignment. 2. Multilevel degenerative changes, as detailed above. Suspected multilevel moderate to severe foraminal stenosis. MRI could better characterize if clinically indicated. Electronically Signed   By: Margaretha Sheffield MD   On: 07/31/2020 15:42   MR BRAIN WO CONTRAST  Result Date: 07/31/2020 CLINICAL DATA:  Altered mental status EXAM: MRI HEAD WITHOUT CONTRAST TECHNIQUE: Multiplanar, multiecho pulse sequences of the brain and surrounding structures were  obtained without intravenous contrast.  COMPARISON:  MRI head 04/03/2011 FINDINGS: Brain: Limited study.  Patient not able to complete the study. Moderate to advanced atrophy with progression. Progressive white matter changes compatible with chronic microvascular ischemia. Chronic hemorrhage right thalamus unchanged. Chronic hemorrhage left thalamus is new since the prior study. Negative for mass lesion. Negative for acute infarct. Vascular: Normal arterial flow voids Skull and upper cervical spine: No focal skeletal abnormality. Sinuses/Orbits: Mild mucosal edema paranasal sinuses. Negative orbit Other: None IMPRESSION: No acute abnormality Progression of atrophy and chronic microhemorrhage since 2012. Chronic hemorrhage in the thalamus bilaterally. Correlate with hypertension history. Electronically Signed   By: Franchot Gallo M.D.   On: 07/31/2020 18:59   DG Knee Complete 4 Views Right  Result Date: 07/31/2020 CLINICAL DATA:  Right knee pain without known injury. EXAM: RIGHT KNEE - COMPLETE 4+ VIEW COMPARISON:  None. FINDINGS: No evidence of fracture, dislocation, or joint effusion. Severe narrowing of medial joint space is noted. Soft tissues are unremarkable. IMPRESSION: Severe degenerative joint disease. No acute abnormality seen in the right knee. Electronically Signed   By: Marijo Conception M.D.   On: 07/31/2020 15:09    Pending Labs Unresulted Labs (From admission, onward)          Start     Ordered   07/31/20 1658  Urine culture  ONCE - STAT,   STAT        07/31/20 1657          Vitals/Pain Today's Vitals   08/01/20 0630 08/01/20 0733 08/01/20 0800 08/01/20 0830  BP: (!) 142/56 (!) 152/49 (!) 134/43 (!) 141/44  Pulse: (!) 54 (!) 54 (!) 54 (!) 54  Resp: 18 16  16   Temp:      TempSrc:      SpO2: 96% 93% 94% 93%  Weight:      Height:      PainSc:        Isolation Precautions No active isolations  Medications Medications  ALPRAZolam (XANAX) tablet 0.25 mg (0.25 mg Oral Given 07/31/20 2147)  amLODipine (NORVASC)  tablet 2.5 mg (2.5 mg Oral Given 07/31/20 2147)  aspirin EC tablet 81 mg (81 mg Oral Given 07/31/20 2148)  escitalopram (LEXAPRO) tablet 30 mg (30 mg Oral Given 07/31/20 2148)  metoprolol succinate (TOPROL-XL) 24 hr tablet 25 mg (25 mg Oral Given 07/31/20 2148)  ramipril (ALTACE) capsule 5 mg (5 mg Oral Not Given 07/31/20 2156)  0.9 %  sodium chloride infusion ( Intravenous Rate/Dose Verify 08/01/20 0620)  acetaminophen (TYLENOL) tablet 650 mg (has no administration in time range)    Or  acetaminophen (TYLENOL) suppository 650 mg (has no administration in time range)  ondansetron (ZOFRAN) tablet 4 mg (has no administration in time range)    Or  ondansetron (ZOFRAN) injection 4 mg (has no administration in time range)  polyethylene glycol (MIRALAX / GLYCOLAX) packet 17 g (has no administration in time range)    Mobility walks with device High fall risk   Focused Assessments    R Recommendations: See Admitting Provider Note  Report given to:   Additional Notes:

## 2020-08-01 NOTE — TOC Initial Note (Signed)
Transition of Care [facility]) - Initial/Assessment Note    Patient Details  Name: [name] MRN: [id number] Date of Birth: 08-22-1934  Transition of Care [facility]) CM/SW Contact:    Salome Arnt, [place] Phone Number: 08/01/2020, 10:22 AM  Clinical Narrative:  Pt admitted due to metabolic encephalopathy/new dementia. LCSW spoke with pt's husband and daughter to complete assessment. They both report a rapid decline recently. At baseline, pt is pretty independent with ADLs. Her husband indicates he is 84 years old and unable to care for her at home in current condition. PT evaluated pt and recommend SNF. LCSW discussed placement process and family is agreeable to SNF in [place]. TOC will start insurance authorization and follow up with bed offers.                   Expected Discharge Plan: Skilled Nursing Facility Barriers to Discharge: Continued Medical Work up   Patient Goals and CMS Choice Patient states their goals for this hospitalization and ongoing recovery are:: short term SNF   Choice offered to / list presented to : Spouse, Adult Children  Expected Discharge Plan and Services Expected Discharge Plan: [place] In-house Referral: Clinical Social Work   Post Acute Care Choice: [place] Living arrangements for the past 2 months: Single Family Home                 DME Arranged: N/A DME Agency: NA       HH Arranged: NA [place] Agency: NA        Prior Living Arrangements/Services Living arrangements for the past 2 months: [place]   Patient language and need for interpreter reviewed:: Yes Do you feel safe going back to the place where you live?: Yes      Need for Family Participation in Patient Care: Yes (Comment)   Current home services: DME (cane, walker) Criminal Activity/Legal Involvement Pertinent to Current Situation/Hospitalization: No - Comment as needed  Activities of Daily Living Home Assistive  Devices/Equipment: Environmental consultant (specify type), Cane (specify quad or straight) ADL Screening (condition at time of admission) Patient's cognitive ability adequate to safely complete daily activities?: Yes Is the patient deaf or have difficulty hearing?: No Does the patient have difficulty seeing, even when wearing glasses/contacts?: No Does the patient have difficulty concentrating, remembering, or making decisions?: No Patient able to express need for assistance with ADLs?: Yes Does the patient have difficulty dressing or bathing?: No Independently performs ADLs?: Yes (appropriate for developmental age) Does the patient have difficulty walking or climbing stairs?: Yes Weakness of Legs: Both Weakness of Arms/Hands: None  Permission Sought/Granted                  Emotional Assessment   Attitude/Demeanor/Rapport: Unable to Assess Affect (typically observed): Unable to Assess Orientation: : Oriented to Self, Oriented to Place, Oriented to Situation Alcohol / Substance Use: Not Applicable Psych Involvement: No (comment)  Admission diagnosis:  Weakness [R53.1] Disorientation [R41.0] AMS (altered mental status) [R41.82] Patient Active Problem List   Diagnosis Date Noted  . AMS (altered mental status) 07/31/2020  . Osteoarthritis of right knee 04/14/2020  . MCI (mild cognitive impairment) 01/09/2016  . History of stroke 01/09/2016  . Acute blood loss anemia   . First degree hemorrhoids   . Mucosal abnormality of stomach   . Rectal bleed 09/30/2014  . BRBPR (bright red blood per rectum) 09/30/2014  . Type 2 diabetes mellitus ([place]) 03/16/2013  . Diabetic retinopathy ([place]) 03/16/2013  .  Stroke ([place]) 01/24/2011  . Coronary atherosclerosis of native coronary artery 12/13/2010  . Mixed hyperlipidemia 11/05/2010  . Essential hypertension, benign 11/05/2010  . GERD 11/05/2010   PCP:  Erven Colla, DO Pharmacy:   [facility], [place] [address] Phone: [phone] Fax: [phone]     Social Determinants of Health (SDOH) Interventions    Readmission Risk Interventions No flowsheet data found.

## 2020-08-01 NOTE — Evaluation (Signed)
Physical Therapy Evaluation Patient Details Name: [name] MRN: [id number] DOB: 1934/01/08 Today's Date: 08/01/2020   History of Present Illness  [name] is a 86 y.o. female with medical history significant for diabetes mellitus, hypertension, stroke in 2012.Patient was brought to the ED reports of increasing confusion.  Symptoms initially started about 2 weeks ago, but daughter present at bedside reports that over the past 4 days patient has rapidly declined.  She reports patient is constantly reaching for something that is not there, wandering around in her feet tremors sitting down to staring into space, speech trailing off and patient forgetting what she was about to do.Over the past week patient has had episodes of urinary incontinence, daughter reports this has never happened before.  Patient tells me she has urgency and incontinence.Daughter reports after patient stroke in 2012, patient's gait was changed slightly, not picking up her feet from the floor when she walks, but over the past week this has significantly worsened, she also reports patient's gait has slowed..  Today patient was so weak, patient's daughter and patient's son had difficulty getting patient up.Prior to this patient has a cane, but ambulates without assistance.  Patient was able to go on a trip in October with family and he had no issues.    Clinical Impression  Patient very unsteady on feet, unable to stand without AD, at high risk for falls and limited to a few steps at bedside with frequent leaning to the left.  Patient put back to bed after therapy.  Patient will benefit from continued physical therapy in hospital and recommended venue below to increase strength, balance, endurance for safe ADLs and gait.    Follow Up Recommendations SNF    Equipment Recommendations  Rolling walker with 5" wheels    Recommendations for Other Services       Precautions / Restrictions  Precautions Precautions: Fall Restrictions Weight Bearing Restrictions: No      Mobility  Bed Mobility Overal bed mobility: Needs Assistance Bed Mobility: Supine to Sit;Sit to Supine     Supine to sit: Mod assist Sit to supine: Min assist;Mod assist   General bed mobility comments: slow labored movement requiring Mod assist to move BLE during supine<>sitting    Transfers Overall transfer level: Needs assistance   Transfers: Sit to/from Stand;Stand Pivot Transfers Sit to Stand: Min assist Stand pivot transfers: Min assist;Mod assist       General transfer comment: unable to stand without use of AD due to BLE weakness and tilting to the left side  Ambulation/Gait Ambulation/Gait assistance: Mod assist Gait Distance (Feet): 6 Feet Assistive device: Rolling walker (2 wheeled) Gait Pattern/deviations: Decreased step length - left;Decreased step length - right;Decreased stance time - left;Decreased stride length;Decreased dorsiflexion - left Gait velocity: decreased   General Gait Details: patient limited to a few steps at bedside due to leaning to the left, very unsteady, generalized weakness and fatigue  Stairs            Wheelchair Mobility    Modified Rankin (Stroke Patients Only)       Balance Overall balance assessment: Needs assistance Sitting-balance support: Feet supported;No upper extremity supported Sitting balance-Leahy Scale: Fair Sitting balance - Comments: seated at EOB   Standing balance support: During functional activity;No upper extremity supported Standing balance-Leahy Scale: Poor Standing balance comment: fair using RW  Pertinent Vitals/Pain Pain Assessment: Faces Faces Pain Scale: Hurts a little bit Pain Location: low back Pain Descriptors / Indicators: Discomfort Pain Intervention(s): Limited activity within patient's tolerance;Monitored during session;Repositioned    Home Living  Family/patient expects to be discharged to:: Private residence Living Arrangements: Spouse/significant other Available Help at Discharge: Family;Available PRN/intermittently Type of Home: House Home Access: Stairs to enter Entrance Stairs-Rails: Right Entrance Stairs-Number of Steps: 4 Home Layout: One level Home Equipment: Walker - standard;Cane - quad;Cane - single point      Prior Function Level of Independence: Independent         Comments: household ambulator without AD     Hand Dominance        Extremity/Trunk Assessment   Upper Extremity Assessment Upper Extremity Assessment: Generalized weakness    Lower Extremity Assessment Lower Extremity Assessment: Generalized weakness;RLE deficits/detail;LLE deficits/detail RLE Deficits / Details: grossly -4/5 RLE Sensation: decreased light touch RLE Coordination: decreased fine motor LLE Deficits / Details: grossly 3+/5 except ankle dorsiflexion 2+/5 LLE Sensation: WNL LLE Coordination: decreased fine motor    Cervical / Trunk Assessment Cervical / Trunk Assessment: Normal  Communication   Communication: No difficulties  Cognition Arousal/Alertness: Awake/alert Behavior During Therapy: WFL for tasks assessed/performed Overall Cognitive Status: Within Functional Limits for tasks assessed                                        General Comments      Exercises     Assessment/Plan    PT Assessment Patient needs continued PT services  PT Problem List Decreased strength;Decreased activity tolerance;Decreased balance;Decreased mobility       PT Treatment Interventions Balance training;DME instruction;Gait training;Stair training;Functional mobility training;Therapeutic activities;Therapeutic exercise;Patient/family education    PT Goals (Current goals can be found in the Care Plan section)  Acute Rehab PT Goals Patient Stated Goal: return home after rehab PT Goal Formulation: With  patient/family Time For Goal Achievement: 08/15/20 Potential to Achieve Goals: Good    Frequency Min 3X/week   Barriers to discharge        Co-evaluation               AM-PAC PT "6 Clicks" Mobility  Outcome Measure Help needed turning from your back to your side while in a flat bed without using bedrails?: A Little Help needed moving from lying on your back to sitting on the side of a flat bed without using bedrails?: A Lot Help needed moving to and from a bed to a chair (including a wheelchair)?: A Lot Help needed standing up from a chair using your arms (e.g., wheelchair or bedside chair)?: A Lot Help needed to walk in hospital room?: A Lot Help needed climbing 3-5 steps with a railing? : Total 6 Click Score: 12    End of Session   Activity Tolerance: Patient tolerated treatment well;Patient limited by fatigue Patient left: in bed;with call bell/phone within reach;with family/visitor present Nurse Communication: Mobility status PT Visit Diagnosis: Other abnormalities of gait and mobility (R26.89);Unsteadiness on feet (R26.81);Muscle weakness (generalized) (M62.81)    Time: 5038-8828 PT Time Calculation (min) (ACUTE ONLY): 30 min   Charges:   PT Evaluation $PT Eval Moderate Complexity: 1 Mod PT Treatments $Therapeutic Activity: 23-37 mins        10:20 AM, 08/01/20 Lonell Grandchild, MPT Physical Therapist with [facility] [phone] office [phone] mobile phone

## 2020-08-01 NOTE — ED Notes (Signed)
Report attempted x 1

## 2020-08-02 ENCOUNTER — Telehealth: Payer: Self-pay

## 2020-08-02 DIAGNOSIS — I639 Cerebral infarction, unspecified: Secondary | ICD-10-CM

## 2020-08-02 DIAGNOSIS — G9341 Metabolic encephalopathy: Secondary | ICD-10-CM | POA: Diagnosis present

## 2020-08-02 DIAGNOSIS — F0390 Unspecified dementia without behavioral disturbance: Secondary | ICD-10-CM | POA: Diagnosis present

## 2020-08-02 LAB — LIPID PANEL
Cholesterol: 139 mg/dL (ref 0–200)
HDL: 50 mg/dL (ref >40)
LDL Cholesterol: 69 mg/dL (ref 0–99)
Total CHOL/HDL Ratio: 2.8 RATIO
Triglycerides: 99 mg/dL (ref <150)
VLDL: 20 mg/dL (ref 0–40)

## 2020-08-02 LAB — BASIC METABOLIC PANEL
Anion gap: 9 (ref 5–15)
BUN: 14 mg/dL (ref 8–23)
CO2: 27 mmol/L (ref 22–32)
Calcium: 9.4 mg/dL (ref 8.9–10.3)
Chloride: 97 mmol/L — ABNORMAL LOW (ref 98–111)
Creatinine, Ser: 0.91 mg/dL (ref 0.44–1.00)
GFR, Estimated: >60 mL/min (ref >60)
Glucose, Bld: 124 mg/dL — ABNORMAL HIGH (ref 70–99)
Potassium: 4.1 mmol/L (ref 3.5–5.1)
Sodium: 133 mmol/L — ABNORMAL LOW (ref 135–145)

## 2020-08-02 LAB — CBC
HCT: 38.5 % (ref 36.0–46.0)
Hemoglobin: 12.6 g/dL (ref 12.0–15.0)
MCH: 30.4 pg (ref 26.0–34.0)
MCHC: 32.7 g/dL (ref 30.0–36.0)
MCV: 92.8 fL (ref 80.0–100.0)
Platelets: 291 10*3/uL (ref 150–400)
RBC: 4.15 MIL/uL (ref 3.87–5.11)
RDW: 11.4 % — ABNORMAL LOW (ref 11.5–15.5)
WBC: 7.1 10*3/uL (ref 4.0–10.5)
nRBC: 0 % (ref 0.0–0.2)

## 2020-08-02 LAB — URINE CULTURE

## 2020-08-02 LAB — GLUCOSE, CAPILLARY
Glucose-Capillary: 129 mg/dL — ABNORMAL HIGH (ref 70–99)
Glucose-Capillary: 169 mg/dL — ABNORMAL HIGH (ref 70–99)
Glucose-Capillary: 97 mg/dL (ref 70–99)
Glucose-Capillary: 117 mg/dL — ABNORMAL HIGH (ref 70–99)

## 2020-08-02 MED ORDER — ALPRAZOLAM 0.5 MG PO TABS
0.5000 mg | ORAL_TABLET | Freq: Three times a day (TID) | ORAL | Status: DC | PRN
  Administered 2020-08-02: 0.5 mg via ORAL

## 2020-08-02 MED ORDER — POLYVINYL ALCOHOL 1.4 % OP SOLN
1.0000 [drp] | OPHTHALMIC | Status: DC | PRN
  Administered 2020-08-02: 1 [drp] via OPHTHALMIC
  Filled 2020-08-02: qty 15

## 2020-08-02 MED ORDER — NAPHAZOLINE-GLYCERIN 0.012-0.2 % OP SOLN: 2.0000 [drp] | Freq: Four times a day (QID) | OPHTHALMIC | Status: DC | PRN

## 2020-08-02 MED ORDER — RAMIPRIL 5 MG PO CAPS
5.0000 mg | ORAL_CAPSULE | Freq: Every day | ORAL | Status: DC
  Administered 2020-08-03: 5 mg via ORAL
  Filled 2020-08-02: qty 1

## 2020-08-02 MED ORDER — MELATONIN 3 MG PO TABS
3.0000 mg | ORAL_TABLET | Freq: Every day | ORAL | Status: AC
  Administered 2020-08-02: 3 mg via ORAL
  Filled 2020-08-02: qty 1

## 2020-08-02 NOTE — Telephone Encounter (Signed)
FYI- Daughter called to let you know patient is at [facility] with fluid on her brain.

## 2020-08-02 NOTE — Care Management (Signed)
Son of pt does not want patient disturbed while sleeping- telemetry battery needs to be replaced but son declined. Son declines any care while pt is sleeping.

## 2020-08-02 NOTE — Progress Notes (Signed)
Physical Therapy Treatment Patient Details Name: [name] MRN: [id number] DOB: [birth year] Today's Date: 08/02/2020    History of Present Illness [name] is a 86 y.o. female with medical history significant for diabetes mellitus, hypertension, stroke in 2012.Patient was brought to the ED reports of increasing confusion.  Symptoms initially started about 2 weeks ago, but daughter present at bedside reports that over the past 4 days patient has rapidly declined.  She reports patient is constantly reaching for something that is not there, wandering around in her feet tremors sitting down to staring into space, speech trailing off and patient forgetting what she was about to do.Over the past week patient has had episodes of urinary incontinence, daughter reports this has never happened before.  Patient tells me she has urgency and incontinence.Daughter reports after patient stroke in 2012, patient's gait was changed slightly, not picking up her feet from the floor when she walks, but over the past week this has significantly worsened, she also reports patient's gait has slowed..  Today patient was so weak, patient's daughter and patient's son had difficulty getting patient up.Prior to this patient has a cane, but ambulates without assistance.  Patient was able to go on a trip in October with family and he had no issues.    PT Comments    Pt attempting to sit on EOB upon therapist's entrance, reports need for urination.  Bed mobility and transfer training complete with min A.  Initial difficulty with standing due to LE weakness, required multi-modal cueing for proper hand and foot placement to complete transfers safely.  Sidestepping complete in front of bed for safety due to core and LE weakness.  Seated balance activities complete with mod A required to reduce posterior lean.  EOS pt left in bed with call bell within reach, bed alarm set and daughter in room.  Discussed current  physical progress with daughter after session, she mentioned concern with POC.  Encouraged to discuss with MD.     Follow Up Recommendations  SNF     Equipment Recommendations  Rolling walker with 5" wheels    Recommendations for Other Services       Precautions / Restrictions Precautions Precautions: Fall Restrictions Weight Bearing Restrictions: No    Mobility  Bed Mobility Overal bed mobility: Modified Independent Bed Mobility: Supine to Sit;Sit to Supine     Supine to sit: Min assist;Mod assist Sit to supine: Min assist;Mod assist   General bed mobility comments: slow labored movement, required cueing for hand and foot placement for suping to sitting on EOB  Transfers Overall transfer level: Needs assistance   Transfers: Sit to/from Stand;Stand Pivot Transfers Sit to Stand: Min assist Stand pivot transfers: Min assist;Mod assist       General transfer comment: unable to stand without use of AD due to BLE weakness and tilting to the left side  Ambulation/Gait Ambulation/Gait assistance: Mod assist Gait Distance (Feet): 8 Feet Assistive device: 1 person hand held assist;Rolling walker (2 wheeled) Gait Pattern/deviations: Decreased step length - left;Decreased step length - right;Decreased stance time - left;Decreased stride length;Decreased dorsiflexion - left Gait velocity: decreased   General Gait Details: patient limited to a few steps at bedside due to leaning to the left, very unsteady, generalized weakness and fatigue   Stairs             Wheelchair Mobility    Modified Rankin (Stroke Patients Only)       Balance Overall balance assessment: Needs assistance  Sitting-balance support: Feet supported;No upper extremity supported Sitting balance-Leahy Scale: Fair Sitting balance - Comments: seated at EOB, required cueing for foot placement on floor and assistance with posterior lean                                    Cognition  Arousal/Alertness: Awake/alert Behavior During Therapy: WFL for tasks assessed/performed Overall Cognitive Status: History of cognitive impairments - at baseline                                 General Comments: Able to recall name and DOB.  Unaware that she is in hospital, thought she was home      Exercises General Exercises - Upper Extremity Shoulder Flexion: AROM;Both;5 reps;Seated (working on seated balance training) General Exercises - Lower Extremity Long Arc Quad: Both;5 reps;Seated Hip Flexion/Marching: Both;5 reps;Seated Toe Raises: Both;5 reps;Seated Heel Raises: Both;5 reps;Seated    General Comments        Pertinent Vitals/Pain Pain Score: 6  Pain Location: Rt hip, reports decreased pain at EOS Pain Descriptors / Indicators: Discomfort Pain Intervention(s): Limited activity within patient's tolerance;Monitored during session;Repositioned    Home Living                      Prior Function            PT Goals (current goals can now be found in the care plan section)      Frequency    Min 3X/week      PT Plan Current plan remains appropriate    Co-evaluation              AM-PAC PT "6 Clicks" Mobility   Outcome Measure  Help needed turning from your back to your side while in a flat bed without using bedrails?: A Little Help needed moving from lying on your back to sitting on the side of a flat bed without using bedrails?: A Lot Help needed moving to and from a bed to a chair (including a wheelchair)?: A Lot Help needed standing up from a chair using your arms (e.g., wheelchair or bedside chair)?: A Lot Help needed to walk in hospital room?: A Lot Help needed climbing 3-5 steps with a railing? : Total 6 Click Score: 12    End of Session Equipment Utilized During Treatment: Gait belt Activity Tolerance: Patient tolerated treatment well;Patient limited by fatigue Patient left: in bed;with call bell/phone within  reach;with family/visitor present Nurse Communication: Mobility status PT Visit Diagnosis: Other abnormalities of gait and mobility (R26.89);Unsteadiness on feet (R26.81);Muscle weakness (generalized) (M62.81)     Time: 3748-2707 PT Time Calculation (min) (ACUTE ONLY): 28 min  Charges:  $Therapeutic Activity: 23-37 mins                     [name], LPTA/CLT; CBIS [phone]  [name] 08/02/2020, 10:31 AM

## 2020-08-02 NOTE — TOC Progression Note (Signed)
Transition of Care [facility]) - Progression Note   Patient Details  Name: [name] MRN: [id number] Date of Birth: Jan 08, 1934  [facility]) CM/SW [place], LCSW Phone Number: 08/02/2020, 10:33 AM  Clinical Narrative: CSW spoke with daughter, Trellis Paganini, regarding Covid vaccine card. Daughter provided copy to CSW. CSW provided copy to Tami with [facility]. Husband aware [facility] has copy. TOC to follow.  Expected Discharge Plan: [place] Barriers to Discharge: Continued Medical Work up  Expected Discharge Plan and Services Expected Discharge Plan: [place] In-house Referral: Clinical Social Work Post Acute Care Choice: [place] arrangements for the past 2 months: Single Family Home             DME Arranged: N/A DME Agency: NA HH Arranged: NA [place] Agency: NA  Readmission Risk Interventions No flowsheet data found.

## 2020-08-02 NOTE — Progress Notes (Signed)
Patient Demographics:    [name], is a 86 y.o. female, DOB - [birth year], RXV:[id number]  Admit date - 07/31/2020   Admitting Physician Ejiroghene Arlyce Dice, MD  Outpatient Primary MD for the patient is Erven Colla, DO  LOS - 1   Chief Complaint  Patient presents with  . Weakness        Subjective:    [place] today has no fevers, no emesis,  No chest pain,   -Patient's daughter at bedside, questions answered  Assessment  & Plan :    Principal Problem:   Acute metabolic encephalopathy Active Problems:   Moderately advanced dementia without behavioral disturbance (HCC)   Type 2 diabetes mellitus (HCC)   Essential hypertension, benign   H/o Stroke /--history of chronic microhemorrhages   Diabetic retinopathy ([place])  Brief Summary:- [name] is a 86 y.o. female with a history of diabetes mellitus, hypertension, stroke.  Patient with no previously known history of dementia.  Patient presented secondary to increasing confusion with associated incontinence and significantly worsening gait, admitted on 07/31/2020 for further work-up  A/p 1) acute metabolic encephalopathy superimposed on baseline moderately advanced dementia with cognitive and memory deficits--- -Hyponatremia noted with a sodium of 133 -MRI brain from 07/31/2020 consistent with progressive cerebral atrophy and chronic microhemorrhage which has further progression 2012 -Neurology consult appreciated -Neuroimaging studies without acute stroke at this time -Continue aspirin 81 mg daily  2) ambulatory dysfunction and fall risk--- PTA patient was relatively independent, recently developed significant weakness and gait problems -Physical therapy eval appreciated, recommends SNF rehab -Neurology input appreciated patient does not meet criteria for NPH  PT/OT evaluations performed. SNF Rehab  recommended. SNF appropriate as the patient has received 3 days of hospital care (or the 3 day period has been waived by the patient's insurance company) and is felt to need rehab services to restore this patient to their prior level of function to achieve safe transition back to home care. This patient needs rehab services for at least 5 days per week and skilled nursing services daily to facilitate this transition. Rehab is being requested as the most appropriate d/c option for this patient and is NOT felt to be for custodial care as evidenced by previously independent with ADLs including not limited to  shopping, cooking , bathing, toileting, personal hygiene care prior to admission   3)HTN--neuroimaging studies suggest sequela of HTN with chronic microhemorrhages -Continue amlodipine 2.5 mg daily, ramipril 5 mg daily and Toprol-XL 25 mg daily  4) depression/anxiety--- continue Lexapro, may use Xanax as needed  5)DM2 with history of diabetic retinopathy--- last A1c 5.9 reflecting excellent DM control PTA, patient is basically using diet and lifestyle for diabetic control  6)Social/Ethics--plan of care discussed with patient daughter at bedside, Trellis Paganini- questions answered, patient remains a full code -  Disposition/Need for in-Hospital Stay- patient unable to be discharged at this time due to --unsafe discharge home, due to recurrent fall risk awaiting transfer to SNF rehab  Status is: Inpatient  Remains inpatient appropriate because:unsafe discharge home, due to recurrent fall risk awaiting transfer to SNF rehab   Disposition: The patient is from: Home              Anticipated d/c is  to: SNF              Anticipated d/c date is: 1 day              Patient currently is medically stable to d/c. Barriers: Not Clinically Stable- unsafe discharge home, due to recurrent fall risk awaiting transfer to SNF rehab  Code Status : full code  Family Communication:   Discussed with daughter at  bedside  Consults  :  Neuro  DVT Prophylaxis  :   - Heparin - SCDs   Lab Results  Component Value Date   PLT 291 08/02/2020    Inpatient Medications  Scheduled Meds: . ALPRAZolam  0.25 mg Oral Q24H  . amLODipine  2.5 mg Oral Daily  . aspirin EC  81 mg Oral Daily  . escitalopram  30 mg Oral Daily  . metoprolol succinate  25 mg Oral Daily  . ramipril  5 mg Oral BID   Continuous Infusions: PRN Meds:.acetaminophen **OR** acetaminophen, ALPRAZolam, ondansetron **OR** ondansetron (ZOFRAN) IV, polyethylene glycol    Anti-infectives (From admission, onward)   None        Objective:   Vitals:   08/01/20 1819 08/01/20 2223 08/02/20 0613 08/02/20 1425  BP: (!) 166/72 (!) 158/71 (!) 102/57 129/60  Pulse: (!) 57 (!) 52 (!) 55 (!) 51  Resp: 18 18 16 16   Temp: 98 F (36.7 C) 98.1 F (36.7 C) 98 F (36.7 C)   TempSrc: Oral Oral Oral   SpO2: 96% 95% 96% 100%  Weight:      Height:        Wt Readings from Last 3 Encounters:  08/01/20 65.8 kg  07/25/20 65.8 kg  04/17/20 65.3 kg     Intake/Output Summary (Last 24 hours) at 08/02/2020 1653 Last data filed at 08/02/2020 1100 Gross per 24 hour  Intake 580 ml  Output --  Net 580 ml     Physical Exam  Gen:- Awake Alert,  In no apparent distress  HEENT:- [place].AT, No sclera icterus Neck-Supple Neck,No JVD,.  Lungs-  CTAB , fair symmetrical air movement CV- S1, S2 normal, regular  Abd-  +ve B.Sounds, Abd Soft, No tenderness,    Extremity/Skin:- No  edema, pedal pulses present  Psych-cognitive and memory deficits noted  neuro-generalized weakness and unsteady gait concerns, no new focal deficits, no tremors   Data Review:   Micro Results Recent Results (from the past 240 hour(s))  Urine culture     Status: Abnormal   Collection Time: 07/31/20  4:58 PM   Specimen: Urine, Clean Catch  Result Value Ref Range Status   Specimen Description   Final    URINE, CLEAN CATCH Performed at [facility], [address],  [place], [place] [address]    Special Requests   Final    NONE Performed at [facility], [address] [place], [place] [address]    Culture MULTIPLE SPECIES PRESENT, SUGGEST RECOLLECTION (A)  Final   Report Status 08/02/2020 FINAL  Final  Resp Panel by RT-PCR (Flu A&B, Covid) Nasopharyngeal Swab     Status: None   Collection Time: 07/31/20  5:04 PM   Specimen: Nasopharyngeal Swab; Nasopharyngeal(NP) swabs in vial transport medium  Result Value Ref Range Status   SARS Coronavirus 2 by RT PCR NEGATIVE NEGATIVE Final    Comment: (NOTE) SARS-CoV-2 target nucleic acids are NOT DETECTED.  The SARS-CoV-2 RNA is generally detectable in upper respiratory specimens during the acute phase of infection. The lowest concentration of  SARS-CoV-2 viral copies this assay can detect is 138 copies/mL. A negative result does not preclude SARS-Cov-2 infection and should not be used as the sole basis for treatment or other patient management decisions. A negative result may occur with  improper specimen collection/handling, submission of specimen other than nasopharyngeal swab, presence of viral mutation(s) within the areas targeted by this assay, and inadequate number of viral copies(<138 copies/mL). A negative result must be combined with clinical observations, patient history, and epidemiological information. The expected result is Negative.  Fact Sheet for Patients:  EntrepreneurPulse.com.au  Fact Sheet for Healthcare Providers:  IncredibleEmployment.be  This test is no t yet approved or cleared by the [place] FDA and  has been authorized for detection and/or diagnosis of SARS-CoV-2 by FDA under an Emergency Use Authorization (EUA). This EUA will remain  in effect (meaning this test can be used) for the duration of the COVID-19 declaration under Section 564(b)(1) of the Act, 21 U.S.C.section 360bbb-3(b)(1), unless the authorization is terminated  or revoked  sooner.       Influenza A by PCR NEGATIVE NEGATIVE Final   Influenza B by PCR NEGATIVE NEGATIVE Final    Comment: (NOTE) The Xpert Xpress SARS-CoV-2/FLU/RSV plus assay is intended as an aid in the diagnosis of influenza from Nasopharyngeal swab specimens and should not be used as a sole basis for treatment. Nasal washings and aspirates are unacceptable for Xpert Xpress SARS-CoV-2/FLU/RSV testing.  Fact Sheet for Patients: EntrepreneurPulse.com.au  Fact Sheet for Healthcare Providers: IncredibleEmployment.be  This test is not yet approved or cleared by the [place] FDA and has been authorized for detection and/or diagnosis of SARS-CoV-2 by FDA under an Emergency Use Authorization (EUA). This EUA will remain in effect (meaning this test can be used) for the duration of the COVID-19 declaration under Section 564(b)(1) of the Act, 21 U.S.C. section 360bbb-3(b)(1), unless the authorization is terminated or revoked.  Performed at [facility], [address], [place], [place] [address]     Radiology Reports DG Chest 1 View  Result Date: 07/31/2020 CLINICAL DATA:  Weakness. EXAM: CHEST  1 VIEW COMPARISON:  December 19, 2010. FINDINGS: The heart size and mediastinal contours are within normal limits. Both lungs are clear. The visualized skeletal structures are unremarkable. IMPRESSION: No active disease. Electronically Signed   By: Marijo Conception M.D.   On: 07/31/2020 15:09   CT Head Wo Contrast  Result Date: 07/31/2020 CLINICAL DATA:  Mental status change, unknown cause. Weakness, confusion per family which began today, denies injury. EXAM: CT HEAD WITHOUT CONTRAST TECHNIQUE: Contiguous axial images were obtained from the base of the skull through the vertex without intravenous contrast. COMPARISON:  Brain MRI 04/03/2011.  Head CT 12/20/2010. FINDINGS: Brain: Mild-to-moderate generalized cerebral atrophy. Moderate ill-defined hypoattenuation  within the cerebral white matter is nonspecific, but compatible chronic small vessel ischemic disease. Lateral and third ventriculomegaly is at least partially related to cerebral volume loss. However, there is prominence of the sylvian fissures bilaterally. Additionally, there is crowding of the cerebral sulci along the high cerebral convexities and a decreased callosal angle. These findings may be seen in the setting of normal pressure hydrocephalus. There is no acute intracranial hemorrhage. No demarcated cortical infarct. No extra-axial fluid collection. No evidence of intracranial mass. No midline shift. Vascular: No hyperdense vessel.  Atherosclerotic calcifications. Skull: Normal. Negative for fracture or focal lesion. Sinuses/Orbits: Visualized orbits show no acute finding. Mild ethmoid sinus mucosal thickening. IMPRESSION: No evidence of acute intracranial hemorrhage or acute infarction.  Mild lateral and third ventriculomegaly is at least partially related to cerebral volume loss. However, there are also some imaging findings which may be seen in the setting of normal pressure hydrocephalus and clinical correlation is recommended. Moderate cerebral white matter chronic small vessel ischemic disease. Electronically Signed   By: Kellie Simmering DO   On: 07/31/2020 15:43   CT Cervical Spine Wo Contrast  Result Date: 07/31/2020 CLINICAL DATA:  Trauma. EXAM: CT CERVICAL SPINE WITHOUT CONTRAST TECHNIQUE: Multidetector CT imaging of the cervical spine was performed without intravenous contrast. Multiplanar CT image reconstructions were also generated. COMPARISON:  None. FINDINGS: Alignment: Straightening of the normal cervical lordosis. Mild stepwise degenerative retrolisthesis of C5 on C6 and C6 on C7. Skull base and vertebrae: No evidence of acute fracture. Vertebral body heights are maintained. Osteopenia. Soft tissues and spinal canal: No prevertebral fluid or swelling. No visible canal hematoma. Disc  levels: Multilevel degenerative disc disease with disc height loss and posterior spurring. There is a left eccentric posterior disc osteophyte complex at C6-C7 which results in at least mild canal stenosis and at least moderate left foraminal stenosis. Multilevel facet and uncovertebral hypertrophy with multilevel suspected moderate to severe foraminal stenosis. Possibly severe foraminal stenosis on the right at C5-C6. Upper chest: Negative. IMPRESSION: 1. No evidence of acute fracture or traumatic malalignment. 2. Multilevel degenerative changes, as detailed above. Suspected multilevel moderate to severe foraminal stenosis. MRI could better characterize if clinically indicated. Electronically Signed   By: Margaretha Sheffield MD   On: 07/31/2020 15:42   MR BRAIN WO CONTRAST  Result Date: 07/31/2020 CLINICAL DATA:  Altered mental status EXAM: MRI HEAD WITHOUT CONTRAST TECHNIQUE: Multiplanar, multiecho pulse sequences of the brain and surrounding structures were obtained without intravenous contrast. COMPARISON:  MRI head 04/03/2011 FINDINGS: Brain: Limited study.  Patient not able to complete the study. Moderate to advanced atrophy with progression. Progressive white matter changes compatible with chronic microvascular ischemia. Chronic hemorrhage right thalamus unchanged. Chronic hemorrhage left thalamus is new since the prior study. Negative for mass lesion. Negative for acute infarct. Vascular: Normal arterial flow voids Skull and upper cervical spine: No focal skeletal abnormality. Sinuses/Orbits: Mild mucosal edema paranasal sinuses. Negative orbit Other: None IMPRESSION: No acute abnormality Progression of atrophy and chronic microhemorrhage since 2012. Chronic hemorrhage in the thalamus bilaterally. Correlate with hypertension history. Electronically Signed   By: Franchot Gallo M.D.   On: 07/31/2020 18:59   DG Knee Complete 4 Views Right  Result Date: 07/31/2020 CLINICAL DATA:  Right knee pain without  known injury. EXAM: RIGHT KNEE - COMPLETE 4+ VIEW COMPARISON:  None. FINDINGS: No evidence of fracture, dislocation, or joint effusion. Severe narrowing of medial joint space is noted. Soft tissues are unremarkable. IMPRESSION: Severe degenerative joint disease. No acute abnormality seen in the right knee. Electronically Signed   By: Marijo Conception M.D.   On: 07/31/2020 15:09     CBC Recent Labs  Lab 07/31/20 1432 08/02/20 0627  WBC 7.7 7.1  HGB 11.8* 12.6  HCT 35.9* 38.5  PLT 256 291  MCV 94.7 92.8  MCH 31.1 30.4  MCHC 32.9 32.7  RDW 11.7 11.4*  LYMPHSABS 1.2  --   MONOABS 0.7  --   EOSABS 0.2  --   BASOSABS 0.1  --     Chemistries  Recent Labs  Lab 07/31/20 1432 08/02/20 0627  NA 133* 133*  K 4.2 4.1  CL 99 97*  CO2 25 27  GLUCOSE 143* 124*  BUN  17 14  CREATININE 1.05* 0.91  CALCIUM 9.1 9.4  AST 18  --   ALT 13  --   ALKPHOS 70  --   BILITOT 0.9  --    ------------------------------------------------------------------------------------------------------------------ Recent Labs    08/02/20 0627  CHOL 139  HDL 50  LDLCALC 69  TRIG 99  CHOLHDL 2.8    Lab Results  Component Value Date   HGBA1C 5.7 (H) 07/31/2020   ------------------------------------------------------------------------------------------------------------------ Recent Labs    08/01/20 0427  TSH 3.052   ------------------------------------------------------------------------------------------------------------------ Recent Labs    08/01/20 0427  VITAMINB12 390  FOLATE 17.0    Coagulation profile Recent Labs  Lab 07/31/20 1432  INR 1.0    No results for input(s): DDIMER in the last 72 hours.  Cardiac Enzymes No results for input(s): CKMB, TROPONINI, MYOGLOBIN in the last 168 hours.  Invalid input(s): CK ------------------------------------------------------------------------------------------------------------------ No results found for: BNP   Roxan Hockey M.D on  08/02/2020 at 4:53 PM  Go to [email or web address] - for contact info  [facility] - Office  [phone]

## 2020-08-02 NOTE — Care Management (Signed)
Daughter threatening to "pop a zanax in momma's mouth" if "they dont help her".

## 2020-08-02 NOTE — Telephone Encounter (Signed)
Pls give neuro referral for the new stroke.  And cancel her next appt with [place] due to being in rehab.   Take care, dr. Lovena Le

## 2020-08-02 NOTE — Telephone Encounter (Signed)
Referral ordered in Epic. Daughter(DPR) notified.

## 2020-08-02 NOTE — Telephone Encounter (Signed)
Husband came in with a note from the daughter stating that they needed a referral to Dr. Harrietta Guardian and [facility]. Because that is who the patient saw last time when she had a stroke.  [facility] is requesting a new referral.  Patient is still in the hospital but will be released soon to a nursing home for rehab.  Per husband she has "fluid on the brain" and has possibly had another stroke. They would like her to see the same neurologist that she saw before.  Patient last seen 07/25/20 and had an appt with [place] set up for next week but won't be able to bring her then since she will be in rehab.  Would like to get the referral for neuro started since it takes a while to get in with them.  Please call daughter Trellis Paganini at [phone] or son Carletta Feasel [phone]

## 2020-08-02 NOTE — TOC Progression Note (Signed)
Transition of Care [facility]) - Progression Note    Patient Details  Name: [name] MRN: [id number] Date of Birth: 08/29/1934  [facility]) CM/SW Contact  Shade Flood, LCSW Phone Number: 08/02/2020, 4:39 PM  Clinical Narrative:     TOC following. Elizabeth at [facility] contacted this AM to inform that pt medically stable for dc. TOC contacted Benjamine Mola again to inquire on auth status this afternoon. Benjamine Mola stated that medical director reviewing the case. As of yet, have not received return call with any information on status of the authorization request. Updated MD.  Will follow up in AM.  Expected Discharge Plan: [place] Barriers to Discharge: Insurance Authorization  Expected Discharge Plan and Services Expected Discharge Plan: [place] In-house Referral: Clinical Social Work   Post Acute Care Choice: [place] Living arrangements for the past 2 months: Single Family Home                 DME Arranged: N/A DME Agency: NA       HH Arranged: NA HH Agency: NA         Social Determinants of Health (SDOH) Interventions    Readmission Risk Interventions No flowsheet data found.

## 2020-08-03 ENCOUNTER — Inpatient Hospital Stay
Admission: RE | Admit: 2020-08-03 | Discharge: 2020-08-25 | Disposition: A | Discharge status: Home / Self Care | Payer: PPO | Source: Ambulatory Visit | Attending: Internal Medicine | Admitting: Internal Medicine

## 2020-08-03 DIAGNOSIS — I639 Cerebral infarction, unspecified: Secondary | ICD-10-CM | POA: Diagnosis not present

## 2020-08-03 DIAGNOSIS — M6281 Muscle weakness (generalized): Secondary | ICD-10-CM | POA: Diagnosis not present

## 2020-08-03 DIAGNOSIS — R41841 Cognitive communication deficit: Secondary | ICD-10-CM | POA: Diagnosis not present

## 2020-08-03 DIAGNOSIS — R55 Syncope and collapse: Secondary | ICD-10-CM | POA: Diagnosis not present

## 2020-08-03 DIAGNOSIS — F0390 Unspecified dementia without behavioral disturbance: Secondary | ICD-10-CM | POA: Diagnosis not present

## 2020-08-03 DIAGNOSIS — G9341 Metabolic encephalopathy: Secondary | ICD-10-CM | POA: Diagnosis not present

## 2020-08-03 DIAGNOSIS — R262 Difficulty in walking, not elsewhere classified: Secondary | ICD-10-CM | POA: Diagnosis not present

## 2020-08-03 DIAGNOSIS — E1149 Type 2 diabetes mellitus with other diabetic neurological complication: Secondary | ICD-10-CM | POA: Diagnosis not present

## 2020-08-03 DIAGNOSIS — I2510 Atherosclerotic heart disease of native coronary artery without angina pectoris: Secondary | ICD-10-CM | POA: Diagnosis not present

## 2020-08-03 DIAGNOSIS — R4182 Altered mental status, unspecified: Secondary | ICD-10-CM | POA: Diagnosis not present

## 2020-08-03 DIAGNOSIS — F339 Major depressive disorder, recurrent, unspecified: Secondary | ICD-10-CM | POA: Diagnosis not present

## 2020-08-03 DIAGNOSIS — K219 Gastro-esophageal reflux disease without esophagitis: Secondary | ICD-10-CM | POA: Diagnosis not present

## 2020-08-03 DIAGNOSIS — F329 Major depressive disorder, single episode, unspecified: Secondary | ICD-10-CM | POA: Diagnosis not present

## 2020-08-03 DIAGNOSIS — F0150 Vascular dementia without behavioral disturbance: Secondary | ICD-10-CM | POA: Diagnosis not present

## 2020-08-03 DIAGNOSIS — E11319 Type 2 diabetes mellitus with unspecified diabetic retinopathy without macular edema: Secondary | ICD-10-CM | POA: Diagnosis not present

## 2020-08-03 DIAGNOSIS — E1169 Type 2 diabetes mellitus with other specified complication: Secondary | ICD-10-CM | POA: Diagnosis not present

## 2020-08-03 DIAGNOSIS — I10 Essential (primary) hypertension: Secondary | ICD-10-CM | POA: Diagnosis not present

## 2020-08-03 DIAGNOSIS — R410 Disorientation, unspecified: Secondary | ICD-10-CM | POA: Diagnosis not present

## 2020-08-03 DIAGNOSIS — Z741 Need for assistance with personal care: Secondary | ICD-10-CM | POA: Diagnosis not present

## 2020-08-03 DIAGNOSIS — M1711 Unilateral primary osteoarthritis, right knee: Secondary | ICD-10-CM | POA: Diagnosis not present

## 2020-08-03 DIAGNOSIS — E871 Hypo-osmolality and hyponatremia: Secondary | ICD-10-CM | POA: Diagnosis not present

## 2020-08-03 DIAGNOSIS — K5909 Other constipation: Secondary | ICD-10-CM | POA: Diagnosis not present

## 2020-08-03 DIAGNOSIS — E785 Hyperlipidemia, unspecified: Secondary | ICD-10-CM | POA: Diagnosis not present

## 2020-08-03 LAB — GLUCOSE, CAPILLARY
Glucose-Capillary: 104 mg/dL — ABNORMAL HIGH (ref 70–99)
Glucose-Capillary: 159 mg/dL — ABNORMAL HIGH (ref 70–99)

## 2020-08-03 MED ORDER — ALPRAZOLAM 0.25 MG PO TABS
0.2500 mg | ORAL_TABLET | Freq: Two times a day (BID) | ORAL | 0 refills | Status: DC | PRN
Start: 2020-08-03 — End: 2020-08-07

## 2020-08-03 MED ORDER — FLUVASTATIN SODIUM 40 MG PO CAPS
40.0000 mg | ORAL_CAPSULE | Freq: Every day | ORAL | 5 refills | Status: DC
Start: 2020-08-03 — End: 2020-08-18

## 2020-08-03 MED ORDER — DICLOFENAC SODIUM 1 % EX GEL: 2.0000 g | Freq: Two times a day (BID) | CUTANEOUS | 3 refills | Status: DC | PRN

## 2020-08-03 MED ORDER — POLYETHYLENE GLYCOL 3350 17 G PO PACK: 17.0000 g | PACK | Freq: Every day | ORAL | 2 refills | Status: AC

## 2020-08-03 MED ORDER — ASPIRIN 81 MG PO TABS: 81.0000 mg | ORAL_TABLET | Freq: Every day | ORAL | 5 refills | Status: AC

## 2020-08-03 MED ORDER — ONDANSETRON HCL 4 MG PO TABS
4.0000 mg | ORAL_TABLET | Freq: Four times a day (QID) | ORAL | 0 refills | Status: DC | PRN
Start: 2020-08-03 — End: 2020-08-18

## 2020-08-03 MED ORDER — NAPHAZOLINE-GLYCERIN 0.012-0.2 % OP SOLN: 2.0000 [drp] | Freq: Four times a day (QID) | OPHTHALMIC | 1 refills | Status: AC | PRN

## 2020-08-03 MED ORDER — EZETIMIBE 10 MG PO TABS
10.0000 mg | ORAL_TABLET | Freq: Every day | ORAL | 3 refills | Status: DC
Start: 2020-08-03 — End: 2020-08-18

## 2020-08-03 MED ORDER — RAMIPRIL 5 MG PO CAPS
5.0000 mg | ORAL_CAPSULE | Freq: Every day | ORAL | 5 refills | Status: DC
Start: 2020-08-03 — End: 2020-08-18

## 2020-08-03 MED ORDER — METOPROLOL SUCCINATE ER 25 MG PO TB24
ORAL_TABLET | ORAL | 1 refills | Status: DC
Start: 2020-08-03 — End: 2020-08-18

## 2020-08-03 MED ORDER — ACETAMINOPHEN 325 MG PO TABS: 650.0000 mg | ORAL_TABLET | ORAL | 1 refills | Status: AC | PRN

## 2020-08-03 MED ORDER — TRAMADOL HCL 50 MG PO TABS: 50.0000 mg | ORAL_TABLET | Freq: Four times a day (QID) | ORAL | 0 refills | Status: DC | PRN

## 2020-08-03 MED ORDER — ESCITALOPRAM OXALATE 20 MG PO TABS: 30.0000 mg | ORAL_TABLET | Freq: Every day | ORAL | 2 refills | Status: DC

## 2020-08-03 NOTE — Discharge Instructions (Signed)
1)Avoid ibuprofen/Advil/Aleve/Motrin/Goody Powders/Naproxen/BC powders/Meloxicam/Diclofenac/Indomethacin and other Nonsteroidal anti-inflammatory medications as these will make you more likely to bleed and can cause stomach ulcers, can also cause Kidney problems.   2)Repeat BMP in 1 week

## 2020-08-03 NOTE — Care Management Important Message (Signed)
Important Message  Patient Details  Name: [name] MRN: [id number] Date of Birth: [birth year]   Medicare Important Message Given:  Yes     Tommy Medal 08/03/2020, 12:32 PM

## 2020-08-03 NOTE — TOC Transition Note (Signed)
Transition of Care [facility]) - CM/SW Discharge Note   Patient Details  Name: KHRISTA BRAUN MRN: [id number] Date of Birth: 27-Apr-1934  Transition of Care [facility]) CM/SW Contact:  Shade Flood, LCSW Phone Number: 08/03/2020, 11:00 AM   Clinical Narrative:     Pt stable for dc per MD. Marilynn Rail received. Auth number is [id number] and pt is authorized for 7 days. Spoke with pt's daughter who had some questions about [facility] lockdown status. Offered to ask Marianna Fuss at [facility] to call her with further info and Butch Penny was agreeable. Spoke with Marianna Fuss at [facility] to update. They can accept pt today. She will call daughter, Butch Penny, to answer her questions.  DC clinical will be sent electronically. RN to call report. There are no other TOC needs for dc.  Final next level of care: [place] Barriers to Discharge: Barriers Resolved   Patient Goals and CMS Choice Patient states their goals for this hospitalization and ongoing recovery are:: short term SNF   Choice offered to / list presented to : Spouse, Adult Children  Discharge Placement PASRR number recieved: 08/01/20            Patient chooses bed at: [facility] Patient to be transferred to facility by: w/c Name of family member notified: Butch Penny Patient and family notified of of transfer: 08/03/20  Discharge Plan and Services In-house Referral: Clinical Social Work   Post Acute Care Choice: [place]          DME Arranged: N/A DME Agency: NA       HH Arranged: NA HH Agency: NA        Social Determinants of Health (SDOH) Interventions     Readmission Risk Interventions No flowsheet data found.

## 2020-08-03 NOTE — Plan of Care (Signed)
  Problem: Education: Goal: Knowledge of General Education information will improve Description: Including pain rating scale, medication(s)/side effects and non-pharmacologic comfort measures 08/03/2020 1413 by Melony Overly, RN Outcome: Adequate for Discharge 08/03/2020 1413 by Melony Overly, RN Outcome: Adequate for Discharge   Problem: Health Behavior/Discharge Planning: Goal: Ability to manage health-related needs will improve 08/03/2020 1413 by Melony Overly, RN Outcome: Adequate for Discharge 08/03/2020 1413 by Melony Overly, RN Outcome: Adequate for Discharge   Problem: Clinical Measurements: Goal: Ability to maintain clinical measurements within normal limits will improve 08/03/2020 1413 by Melony Overly, RN Outcome: Adequate for Discharge 08/03/2020 1413 by Melony Overly, RN Outcome: Adequate for Discharge Goal: Will remain free from infection 08/03/2020 1413 by Melony Overly, RN Outcome: Adequate for Discharge 08/03/2020 1413 by Melony Overly, RN Outcome: Adequate for Discharge Goal: Diagnostic test results will improve 08/03/2020 1413 by Melony Overly, RN Outcome: Adequate for Discharge 08/03/2020 1413 by Melony Overly, RN Outcome: Adequate for Discharge Goal: Respiratory complications will improve 08/03/2020 1413 by Melony Overly, RN Outcome: Adequate for Discharge 08/03/2020 1413 by Melony Overly, RN Outcome: Adequate for Discharge Goal: Cardiovascular complication will be avoided 08/03/2020 1413 by Melony Overly, RN Outcome: Adequate for Discharge 08/03/2020 1413 by Melony Overly, RN Outcome: Adequate for Discharge   Problem: Activity: Goal: Risk for activity intolerance will decrease 08/03/2020 1413 by Melony Overly, RN Outcome: Adequate for Discharge 08/03/2020 1413 by Melony Overly, RN Outcome: Adequate for Discharge   Problem: Nutrition: Goal: Adequate nutrition will be maintained 08/03/2020 1413 by Melony Overly, RN Outcome:  Adequate for Discharge 08/03/2020 1413 by Melony Overly, RN Outcome: Adequate for Discharge   Problem: Coping: Goal: Level of anxiety will decrease 08/03/2020 1413 by Melony Overly, RN Outcome: Adequate for Discharge 08/03/2020 1413 by Melony Overly, RN Outcome: Adequate for Discharge   Problem: Elimination: Goal: Will not experience complications related to bowel motility 08/03/2020 1413 by Melony Overly, RN Outcome: Adequate for Discharge 08/03/2020 1413 by Melony Overly, RN Outcome: Adequate for Discharge Goal: Will not experience complications related to urinary retention 08/03/2020 1413 by Melony Overly, RN Outcome: Adequate for Discharge 08/03/2020 1413 by Melony Overly, RN Outcome: Adequate for Discharge   Problem: Pain Managment: Goal: General experience of comfort will improve 08/03/2020 1413 by Melony Overly, RN Outcome: Adequate for Discharge 08/03/2020 1413 by Melony Overly, RN Outcome: Adequate for Discharge   Problem: Safety: Goal: Ability to remain free from injury will improve 08/03/2020 1413 by Melony Overly, RN Outcome: Adequate for Discharge 08/03/2020 1413 by Melony Overly, RN Outcome: Adequate for Discharge   Problem: Skin Integrity: Goal: Risk for impaired skin integrity will decrease 08/03/2020 1413 by Melony Overly, RN Outcome: Adequate for Discharge 08/03/2020 1413 by Melony Overly, RN Outcome: Adequate for Discharge

## 2020-08-03 NOTE — Discharge Summary (Signed)
[name], is a 86 y.o. female  DOB Mar 12, 1934  MRN [id number].  Admission date:  07/31/2020  Admitting Physician  Bethena Roys, MD  Discharge Date:  08/03/2020   Primary MD  Erven Colla, DO  Recommendations for primary care physician for things to follow:   1)Avoid ibuprofen/Advil/Aleve/Motrin/Goody Powders/Naproxen/BC powders/Meloxicam/Diclofenac/Indomethacin and other Nonsteroidal anti-inflammatory medications as these will make you more likely to bleed and can cause stomach ulcers, can also cause Kidney problems.   2)Repeat BMP in 1 week   Admission Diagnosis  Weakness [R53.1] Disorientation [R41.0] AMS (altered mental status) [R41.82]   Discharge Diagnosis  Weakness [R53.1] Disorientation [R41.0] AMS (altered mental status) [R41.82]    Principal Problem:   Acute metabolic encephalopathy Active Problems:   H/o Stroke /--history of chronic microhemorrhages   Moderately advanced dementia without behavioral disturbance (HCC)   Essential hypertension, benign   Coronary atherosclerosis of native coronary artery   Type 2 diabetes mellitus (HCC)   Diabetic retinopathy ([place])      Past Medical History:  Diagnosis Date  . Allergic rhinitis   . Cataracts, bilateral   . Coronary atherosclerosis of native coronary artery    DES LAD 3/12, 99% nondominant RCA, No CAD otherwise, LVEF 65%  . Diabetic retinopathy ([place])   . Diverticulitis   . Essential hypertension   . GERD (gastroesophageal reflux disease)   . Hemorrhoids   . Hyperlipidemia   . Stroke, hemorrhagic ([place])    Right thalamic hemorrhage 4/12  . Syncope    Neurally mediated  . Type 2 diabetes mellitus ([place])     Past Surgical History:  Procedure Laterality Date  . BREAST CYST EXCISION    . CARDIAC CATHETERIZATION    . CAROTID STENT  11/16/10  . COLONOSCOPY N/A 10/02/2014   Dr. Rourk:engorged internal  hemorrhoids/pancolonic diverticulosis  . ESOPHAGOGASTRODUODENOSCOPY  10/02/14   Dr. Rourk:normal esophagus 2 cm hiatal hernia, gastric erosions likely from NSAID effect     HPI  from the history and physical done on the day of admission:    Chief Complaint: Confusion  HPI: [name] is a 86 y.o. female with medical history significant for diabetes mellitus, hypertension, stroke in 2012. Patient was brought to the ED reports of increasing confusion.  Symptoms initially started about 2 weeks ago, but daughter present at bedside reports that over the past 4 days patient has rapidly declined.  She reports patient is constantly reaching for something that is not there, wandering around in her feet tremors sitting down to staring into space, speech trailing off and patient forgetting what she was about to do. Over the past week patient has had episodes of urinary incontinence, daughter reports this has never happened before.  Patient tells me she has urgency and incontinence. Daughter reports after patient stroke in 2012, patient's gait was changed slightly, not picking up her feet from the floor when she walks, but over the past week this has significantly worsened, she also reports patient's gait has slowed..  Today patient was  so weak, patient's daughter and patient's son had difficulty getting patient up. Prior to this patient has a cane, but ambulates without assistance.  Patient was able to go on a trip in October with family and he had no issues.  Patient denies difficulty breathing, no chest pain, no pain with urination.  She reports pain in her knees and back.  Patient lives with her husband.  Daughter voices concern that patient cannot go back home in this state, as husband is also elderly and would not be able to take care of patient.  ED Course: Heart rate 50s.  Otherwise stable vitals.  Very few bacteria.  Unremarkable CBC BMP.  Lactic acid 1.3.  Ammonia 9.  Chest x-ray, right  knee x-ray without acute abnormality.  Head CT mild lateral and third ventriculomegaly-at least partially related to cerebral volume loss.  Findings also concerning for normal pressure hydrocephalus.  Patient unable to ambulate in the ED. Cervical spine CT without acute abnormality.  Shows multilevel moderate to severe foraminal stenosis. Hospitalist called to admit for altered mental status.   Hospital Course:      Brief Summary:- [place] G Blackwellis a 86 y.o.female with a history of diabetes mellitus, hypertension, stroke. Patient with no previouslyknown history of dementia. Patient presented secondary to increasing confusion with associated incontinence and significantly worsening gait, admitted on 07/31/2020 for further work-up  MRI Brain- Progression of atrophy and chronic microhemorrhage since 2012. -Moderate to advanced atrophy with progression. Progressive white matter changes compatible with chronic microvascular ischemia. Chronic hemorrhage right thalamus unchanged. Chronic hemorrhage left thalamus is new since the prior study  A/p 1) acute metabolic encephalopathy superimposed on baseline moderately advanced dementia with cognitive and memory deficits--- -Hyponatremia noted with a sodium of 133, Repeat BMP in 1 week  -MRI brain from 07/31/2020 consistent with progressive cerebral atrophy and chronic microhemorrhage which has further progression since 2012 -Neurology consult appreciated -Neuroimaging studies without acute stroke at this time -Continue aspirin 81 mg daily and Lescol  2)Ambulatory dysfunction and fall risk--- PTA patient was relatively independent, recently developed significant weakness and gait problems -Physical therapy eval appreciated, recommends SNF rehab -Neurology input appreciated patient does not meet criteria for NPH  PT/OT evaluations performed. SNF Rehab recommended. SNF appropriate as the patient has received 3 days of hospital care (or  the 3 day period has been waived by the patient's insurance company) and is felt to need rehab services to restore this patient to their prior level of function to achieve safe transition back to home care. This patient needs rehab services for at least 5 days per week and skilled nursing services daily to facilitate this transition. Rehab is being requested as the most appropriate d/c option for this patient and is NOT felt to be for custodial care as evidenced by previously independent with ADLs including not limited to  shopping, cooking , bathing, toileting, personal hygiene care prior to admission   3)HTN--neuroimaging studies suggest sequela of HTN with chronic microhemorrhages -Continue amlodipine 2.5 mg daily, ramipril 5 mg daily and Toprol-XL 25 mg daily  4) depression/anxiety--- continue Lexapro, may use Xanax as needed  5)DM2 with history of diabetic retinopathy--- last A1c 5.9 reflecting excellent DM control PTA, patient is basically using diet and lifestyle for diabetic control  6)Social/Ethics--plan of care discussed with patient daughter at bedside, Trellis Paganini, also Discussed with pt's son Iantha Titsworth at bedside- questions answered, patient remains a full code -  Disposition---unsafe discharge home, due to recurrent fall risk awaiting  transfer to SNF rehab   Disposition: The patient is from: Home  Anticipated d/c is to: SNF    Code Status : full code  Family Communication:   Discussed with daughter and son at bedside  Consults  :  Neuro  Discharge Condition: stable  Follow UP   Contact information for after-discharge care    [facility] .   Service: Skilled Nursing Contact information: [address] [place] [address] [phone]                  Diet and Activity recommendation:  As advised  Discharge Instructions    Discharge Instructions    Call MD  for:  difficulty breathing, headache or visual disturbances   Complete by: As directed    Call MD for:  persistant dizziness or light-headedness   Complete by: As directed    Call MD for:  persistant nausea and vomiting   Complete by: As directed    Call MD for:  severe uncontrolled pain   Complete by: As directed    Call MD for:  temperature >100.4   Complete by: As directed    Diet - low sodium heart healthy   Complete by: As directed    Discharge instructions   Complete by: As directed    1)Avoid ibuprofen/Advil/Aleve/Motrin/Goody Powders/Naproxen/BC powders/Meloxicam/Diclofenac/Indomethacin and other Nonsteroidal anti-inflammatory medications as these will make you more likely to bleed and can cause stomach ulcers, can also cause Kidney problems.   2)Repeat BMP in 1 week   Increase activity slowly   Complete by: As directed        Discharge Medications     Allergies as of 08/03/2020      Reactions   Macrodantin    Pravachol [pravastatin Sodium]    Tetanus Toxoids       Medication List    STOP taking these medications   glucose blood test strip Commonly known as: ONE TOUCH ULTRA TEST     TAKE these medications   acetaminophen 325 MG tablet Commonly known as: TYLENOL Take 2 tablets (650 mg total) by mouth every 4 (four) hours as needed for mild pain, fever or headache (or Fever >/= 101).   ALPRAZolam 0.25 MG tablet Commonly known as: XANAX Take 1 tablet (0.25 mg total) by mouth 2 (two) times daily as needed for anxiety or sleep. What changed:   when to take this  reasons to take this   amLODipine 2.5 MG tablet Commonly known as: NORVASC Take 1 tablet (2.5 mg total) by mouth daily.   aspirin 81 MG tablet Take 1 tablet (81 mg total) by mouth daily with breakfast. What changed: when to take this   diclofenac Sodium 1 % Gel Commonly known as: Voltaren Apply 2 g topically 2 (two) times daily as needed.   escitalopram 20 MG tablet Commonly known as:  LEXAPRO Take 1.5 tablets (30 mg total) by mouth daily.   ezetimibe 10 MG tablet Commonly known as: ZETIA Take 1 tablet (10 mg total) by mouth daily.   fluvastatin 40 MG capsule Commonly known as: LESCOL Take 1 capsule (40 mg total) by mouth daily.   metoprolol succinate 25 MG 24 hr tablet Commonly known as: TOPROL-XL TAKE (1) TABLET BY MOUTH AT BEDTIME. NEEDS OFFICE VISIT   naphazoline-glycerin 0.012-0.2 % Soln Commonly known as: CLEAR EYES REDNESS Place 2 drops into both eyes 4 (four) times daily as needed for eye irritation.   nitroGLYCERIN  0.4 MG SL tablet Commonly known as: NITROSTAT Dissolve 1 tablet under tongue every 5 mins up to 3 dose in 15 mins for chest pain. If no relief call 911.   ondansetron 4 MG tablet Commonly known as: ZOFRAN Take 1 tablet (4 mg total) by mouth every 6 (six) hours as needed for nausea.   polyethylene glycol 17 g packet Commonly known as: MIRALAX / GLYCOLAX Take 17 g by mouth daily.   ramipril 5 MG capsule Commonly known as: ALTACE Take 1 capsule (5 mg total) by mouth daily. What changed: when to take this   traMADol 50 MG tablet Commonly known as: ULTRAM Take 1 tablet (50 mg total) by mouth every 6 (six) hours as needed for moderate pain.      Major procedures and Radiology Reports - PLEASE review detailed and final reports for all details, in brief -   DG Chest 1 View  Result Date: 07/31/2020 CLINICAL DATA:  Weakness. EXAM: CHEST  1 VIEW COMPARISON:  December 19, 2010. FINDINGS: The heart size and mediastinal contours are within normal limits. Both lungs are clear. The visualized skeletal structures are unremarkable. IMPRESSION: No active disease. Electronically Signed   By: Marijo Conception M.D.   On: 07/31/2020 15:09   CT Head Wo Contrast  Result Date: 07/31/2020 CLINICAL DATA:  Mental status change, unknown cause. Weakness, confusion per family which began today, denies injury. EXAM: CT HEAD WITHOUT CONTRAST TECHNIQUE: Contiguous  axial images were obtained from the base of the skull through the vertex without intravenous contrast. COMPARISON:  Brain MRI 04/03/2011.  Head CT 12/20/2010. FINDINGS: Brain: Mild-to-moderate generalized cerebral atrophy. Moderate ill-defined hypoattenuation within the cerebral white matter is nonspecific, but compatible chronic small vessel ischemic disease. Lateral and third ventriculomegaly is at least partially related to cerebral volume loss. However, there is prominence of the sylvian fissures bilaterally. Additionally, there is crowding of the cerebral sulci along the high cerebral convexities and a decreased callosal angle. These findings may be seen in the setting of normal pressure hydrocephalus. There is no acute intracranial hemorrhage. No demarcated cortical infarct. No extra-axial fluid collection. No evidence of intracranial mass. No midline shift. Vascular: No hyperdense vessel.  Atherosclerotic calcifications. Skull: Normal. Negative for fracture or focal lesion. Sinuses/Orbits: Visualized orbits show no acute finding. Mild ethmoid sinus mucosal thickening. IMPRESSION: No evidence of acute intracranial hemorrhage or acute infarction. Mild lateral and third ventriculomegaly is at least partially related to cerebral volume loss. However, there are also some imaging findings which may be seen in the setting of normal pressure hydrocephalus and clinical correlation is recommended. Moderate cerebral white matter chronic small vessel ischemic disease. Electronically Signed   By: Kellie Simmering DO   On: 07/31/2020 15:43   CT Cervical Spine Wo Contrast  Result Date: 07/31/2020 CLINICAL DATA:  Trauma. EXAM: CT CERVICAL SPINE WITHOUT CONTRAST TECHNIQUE: Multidetector CT imaging of the cervical spine was performed without intravenous contrast. Multiplanar CT image reconstructions were also generated. COMPARISON:  None. FINDINGS: Alignment: Straightening of the normal cervical lordosis. Mild stepwise  degenerative retrolisthesis of C5 on C6 and C6 on C7. Skull base and vertebrae: No evidence of acute fracture. Vertebral body heights are maintained. Osteopenia. Soft tissues and spinal canal: No prevertebral fluid or swelling. No visible canal hematoma. Disc levels: Multilevel degenerative disc disease with disc height loss and posterior spurring. There is a left eccentric posterior disc osteophyte complex at C6-C7 which results in at least mild canal stenosis and at least moderate left  foraminal stenosis. Multilevel facet and uncovertebral hypertrophy with multilevel suspected moderate to severe foraminal stenosis. Possibly severe foraminal stenosis on the right at C5-C6. Upper chest: Negative. IMPRESSION: 1. No evidence of acute fracture or traumatic malalignment. 2. Multilevel degenerative changes, as detailed above. Suspected multilevel moderate to severe foraminal stenosis. MRI could better characterize if clinically indicated. Electronically Signed   By: Margaretha Sheffield MD   On: 07/31/2020 15:42   MR BRAIN WO CONTRAST  Result Date: 07/31/2020 CLINICAL DATA:  Altered mental status EXAM: MRI HEAD WITHOUT CONTRAST TECHNIQUE: Multiplanar, multiecho pulse sequences of the brain and surrounding structures were obtained without intravenous contrast. COMPARISON:  MRI head 04/03/2011 FINDINGS: Brain: Limited study.  Patient not able to complete the study. Moderate to advanced atrophy with progression. Progressive white matter changes compatible with chronic microvascular ischemia. Chronic hemorrhage right thalamus unchanged. Chronic hemorrhage left thalamus is new since the prior study. Negative for mass lesion. Negative for acute infarct. Vascular: Normal arterial flow voids Skull and upper cervical spine: No focal skeletal abnormality. Sinuses/Orbits: Mild mucosal edema paranasal sinuses. Negative orbit Other: None IMPRESSION: No acute abnormality Progression of atrophy and chronic microhemorrhage since 2012.  Chronic hemorrhage in the thalamus bilaterally. Correlate with hypertension history. Electronically Signed   By: Franchot Gallo M.D.   On: 07/31/2020 18:59   DG Knee Complete 4 Views Right  Result Date: 07/31/2020 CLINICAL DATA:  Right knee pain without known injury. EXAM: RIGHT KNEE - COMPLETE 4+ VIEW COMPARISON:  None. FINDINGS: No evidence of fracture, dislocation, or joint effusion. Severe narrowing of medial joint space is noted. Soft tissues are unremarkable. IMPRESSION: Severe degenerative joint disease. No acute abnormality seen in the right knee. Electronically Signed   By: Marijo Conception M.D.   On: 07/31/2020 15:09   Micro Results   Recent Results (from the past 240 hour(s))  Urine culture     Status: Abnormal   Collection Time: 07/31/20  4:58 PM   Specimen: Urine, Clean Catch  Result Value Ref Range Status   Specimen Description   Final    URINE, CLEAN CATCH Performed at [facility], [address], [place], [place] [address]    Special Requests   Final    NONE Performed at [facility], [address], [place], [place] [address]    Culture MULTIPLE SPECIES PRESENT, SUGGEST RECOLLECTION (A)  Final   Report Status 08/02/2020 FINAL  Final  Resp Panel by RT-PCR (Flu A&B, Covid) Nasopharyngeal Swab     Status: None   Collection Time: 07/31/20  5:04 PM   Specimen: Nasopharyngeal Swab; Nasopharyngeal(NP) swabs in vial transport medium  Result Value Ref Range Status   SARS Coronavirus 2 by RT PCR NEGATIVE NEGATIVE Final    Comment: (NOTE) SARS-CoV-2 target nucleic acids are NOT DETECTED.  The SARS-CoV-2 RNA is generally detectable in upper respiratory specimens during the acute phase of infection. The lowest concentration of SARS-CoV-2 viral copies this assay can detect is 138 copies/mL. A negative result does not preclude SARS-Cov-2 infection and should not be used as the sole basis for treatment or other patient management decisions. A negative result may occur with    improper specimen collection/handling, submission of specimen other than nasopharyngeal swab, presence of viral mutation(s) within the areas targeted by this assay, and inadequate number of viral copies(<138 copies/mL). A negative result must be combined with clinical observations, patient history, and epidemiological information. The expected result is Negative.  Fact Sheet for Patients:  EntrepreneurPulse.com.au  Fact Sheet for  Healthcare Providers:  IncredibleEmployment.be  This test is no t yet approved or cleared by the [place] and  has been authorized for detection and/or diagnosis of SARS-CoV-2 by FDA under an Emergency Use Authorization (EUA). This EUA will remain  in effect (meaning this test can be used) for the duration of the COVID-19 declaration under Section 564(b)(1) of the Act, 21 U.S.C.section 360bbb-3(b)(1), unless the authorization is terminated  or revoked sooner.       Influenza A by PCR NEGATIVE NEGATIVE Final   Influenza B by PCR NEGATIVE NEGATIVE Final    Comment: (NOTE) The Xpert Xpress SARS-CoV-2/FLU/RSV plus assay is intended as an aid in the diagnosis of influenza from Nasopharyngeal swab specimens and should not be used as a sole basis for treatment. Nasal washings and aspirates are unacceptable for Xpert Xpress SARS-CoV-2/FLU/RSV testing.  Fact Sheet for Patients: EntrepreneurPulse.com.au  Fact Sheet for Healthcare Providers: IncredibleEmployment.be  This test is not yet approved or cleared by the [place] FDA and has been authorized for detection and/or diagnosis of SARS-CoV-2 by FDA under an Emergency Use Authorization (EUA). This EUA will remain in effect (meaning this test can be used) for the duration of the COVID-19 declaration under Section 564(b)(1) of the Act, 21 U.S.C. section 360bbb-3(b)(1), unless the authorization is terminated  or revoked.  Performed at [facility], [address], [place], [place] [address]        Today   [place] today has no new concerns, -Patient son at bedside, oral intake is fair -Patient apparently slept much better overnight          Patient has been seen and examined prior to discharge   Objective   Blood pressure (!) 123/54, pulse 72, temperature 97.7 F (36.5 C), resp. rate 16, height 5\' 1"  (1.549 m), weight 65.8 kg, SpO2 94 %.   Intake/Output Summary (Last 24 hours) at 08/03/2020 1101 Last data filed at 08/02/2020 1903 Gross per 24 hour  Intake 240 ml  Output --  Net 240 ml    Exam Gen:- Awake Alert,  In no apparent distress  HEENT:- [place].AT, No sclera icterus Neck-Supple Neck,No JVD,.  Lungs-  CTAB , fair symmetrical air movement CV- S1, S2 normal, regular  Abd-  +ve B.Sounds, Abd Soft, No tenderness,    Extremity/Skin:- No  edema, pedal pulses present  Psych-cognitive and memory deficits noted  neuro-generalized weakness and unsteady gait concerns, no new focal deficits, no tremors   Data Review   CBC w Diff:  Lab Results  Component Value Date   WBC 7.1 08/02/2020   HGB 12.6 08/02/2020   HGB 13.5 07/25/2020   HCT 38.5 08/02/2020   HCT 40.7 07/25/2020   PLT 291 08/02/2020   PLT 320 07/25/2020   LYMPHOPCT 15 07/31/2020   MONOPCT 9 07/31/2020   EOSPCT 3 07/31/2020   BASOPCT 1 07/31/2020    CMP:  Lab Results  Component Value Date   NA 133 (L) 08/02/2020   NA 137 07/25/2020   K 4.1 08/02/2020   CL 97 (L) 08/02/2020   CO2 27 08/02/2020   BUN 14 08/02/2020   BUN 15 07/25/2020   CREATININE 0.91 08/02/2020   CREATININE 0.98 03/02/2014   PROT 6.8 07/31/2020   PROT 6.9 07/25/2020   ALBUMIN 3.6 07/31/2020   ALBUMIN 4.3 07/25/2020   BILITOT 0.9 07/31/2020   BILITOT 0.4 07/25/2020   ALKPHOS 70 07/31/2020   AST 18 07/31/2020   ALT 13 07/31/2020  .  Total Discharge time is about 33 minutes  Roxan Hockey M.D on  08/03/2020 at 11:01 AM  Go to [email or web address] -  for contact info  [facility] - Office  [phone]

## 2020-08-04 ENCOUNTER — Encounter: Payer: Self-pay | Admitting: Adult Health

## 2020-08-04 ENCOUNTER — Non-Acute Institutional Stay (SKILLED_NURSING_FACILITY): Payer: PPO | Admitting: Adult Health

## 2020-08-04 DIAGNOSIS — E785 Hyperlipidemia, unspecified: Secondary | ICD-10-CM | POA: Diagnosis not present

## 2020-08-04 DIAGNOSIS — G9341 Metabolic encephalopathy: Secondary | ICD-10-CM

## 2020-08-04 DIAGNOSIS — I639 Cerebral infarction, unspecified: Secondary | ICD-10-CM | POA: Diagnosis not present

## 2020-08-04 DIAGNOSIS — E871 Hypo-osmolality and hyponatremia: Secondary | ICD-10-CM

## 2020-08-04 DIAGNOSIS — F339 Major depressive disorder, recurrent, unspecified: Secondary | ICD-10-CM

## 2020-08-04 DIAGNOSIS — K5909 Other constipation: Secondary | ICD-10-CM | POA: Insufficient documentation

## 2020-08-04 DIAGNOSIS — F0150 Vascular dementia without behavioral disturbance: Secondary | ICD-10-CM | POA: Diagnosis not present

## 2020-08-04 DIAGNOSIS — E1169 Type 2 diabetes mellitus with other specified complication: Secondary | ICD-10-CM

## 2020-08-04 DIAGNOSIS — I2510 Atherosclerotic heart disease of native coronary artery without angina pectoris: Secondary | ICD-10-CM | POA: Diagnosis not present

## 2020-08-04 DIAGNOSIS — E11319 Type 2 diabetes mellitus with unspecified diabetic retinopathy without macular edema: Secondary | ICD-10-CM | POA: Diagnosis not present

## 2020-08-04 DIAGNOSIS — I10 Essential (primary) hypertension: Secondary | ICD-10-CM

## 2020-08-04 DIAGNOSIS — M1711 Unilateral primary osteoarthritis, right knee: Secondary | ICD-10-CM

## 2020-08-04 NOTE — Progress Notes (Signed)
Location:    [facility] Room Number: 133-P Place of Service:  SNF (31)   CODE STATUS: Full Code   Allergies  Allergen Reactions  . Macrodantin   . Pravachol [Pravastatin Sodium]   . Tetanus Toxoids     Chief Complaint  Patient presents with  . Hospitalization Follow-up    Hospital follow-up from 07/31/2020-08/03/2020     HPI:  She is a 86 year old woman who has been hospitalized from 07-31-20 through 08-03-20. She was taken to the ED for increased confusion. This started about 2 weeks prior to her hospitalization; however; the confusion got much worse 4 days prior to presenting the ED. She did experience urinary incontinence which is new for her. Her gait has slowed down. She was hospitalized for acute metabolic encephalopathy superimposed on moderately advanced dementia and ambulatory dysfunction. She does not meet the criteria for NPH. She is here for short term rehab with her goal to return home. She denies any pain; no anxiety; no insomnia no constipation. She will continue to be followed for her chronic illnesses including: Vascular dementia without behavioral disturbance:     Cerebrovascular accident unspecified mechanism: . Atherosclerosis of native coronary artery of native heart without angina pectoris:     Past Medical History:  Diagnosis Date  . Allergic rhinitis   . Cataracts, bilateral   . Coronary atherosclerosis of native coronary artery    DES LAD 3/12, 99% nondominant RCA, No CAD otherwise, LVEF 65%  . Diabetic retinopathy ([place])   . Diverticulitis   . Essential hypertension   . GERD (gastroesophageal reflux disease)   . Hemorrhoids   . Hyperlipidemia   . Stroke, hemorrhagic ([place])    Right thalamic hemorrhage 4/12  . Syncope    Neurally mediated  . Type 2 diabetes mellitus ([place])     Past Surgical History:  Procedure Laterality Date  . BREAST CYST EXCISION    . CARDIAC CATHETERIZATION    . CAROTID STENT  11/16/10  . COLONOSCOPY N/A  10/02/2014   Dr. Rourk:engorged internal hemorrhoids/pancolonic diverticulosis  . ESOPHAGOGASTRODUODENOSCOPY  10/02/14   Dr. Rourk:normal esophagus 2 cm hiatal hernia, gastric erosions likely from NSAID effect    Social History   Socioeconomic History  . Marital status: Married    Spouse name: Not on file  . Number of children: 2  . Years of education: 36  . Highest education level: Not on file  Occupational History  . Occupation: Retired  Tobacco Use  . Smoking status: Former Smoker    Packs/day: 0.25    Types: Cigarettes    Quit date: 09/02/1988    Years since quitting: 31.9  . Smokeless tobacco: Never Used  Vaping Use  . Vaping Use: Never used  Substance and Sexual Activity  . Alcohol use: No    Alcohol/week: 0.0 standard drinks  . Drug use: No  . Sexual activity: Never  Other Topics Concern  . Not on file  Social History Narrative   Married   Right handed   Caffeine use - coffee x 2 cups   Social Determinants of Health   Financial Resource Strain:   . Difficulty of Paying Living Expenses: Not on file  Food Insecurity:   . Worried About Charity fundraiser in the Last Year: Not on file  . Ran Out of Food in the Last Year: Not on file  Transportation Needs:   . Lack of Transportation (Medical): Not on file  . Lack of Transportation (  Non-Medical): Not on file  Physical Activity:   . Days of Exercise per Week: Not on file  . Minutes of Exercise per Session: Not on file  Stress:   . Feeling of Stress : Not on file  Social Connections:   . Frequency of Communication with Friends and Family: Not on file  . Frequency of Social Gatherings with Friends and Family: Not on file  . Attends Religious Services: Not on file  . Active Member of Clubs or Organizations: Not on file  . Attends Archivist Meetings: Not on file  . Marital Status: Not on file  Intimate Partner Violence:   . Fear of Current or Ex-Partner: Not on file  . Emotionally Abused: Not on file   . Physically Abused: Not on file  . Sexually Abused: Not on file   Family History  Problem Relation Age of Onset  . Pancreatic cancer Mother        Died at age 31  . Diabetes Mother   . Cancer Mother   . Coronary artery disease Father        Died in his 62s  . Heart attack Father   . Hypertension Father   . Breast cancer Sister   . Cancer Sister       VITAL SIGNS BP (!) 134/52   Pulse (!) 56   Temp 98 F (36.7 C)   Resp 20   Ht 5\' 1"  (1.549 m)   Wt 141 lb 9.6 oz (64.2 kg)   SpO2 92%   BMI 26.76 kg/m   Outpatient Encounter Medications as of 08/04/2020  Medication Sig  . acetaminophen (TYLENOL) 325 MG tablet Take 2 tablets (650 mg total) by mouth every 4 (four) hours as needed for mild pain, fever or headache (or Fever >/= 101).  Marland Kitchen ALPRAZolam (XANAX) 0.25 MG tablet Take 1 tablet (0.25 mg total) by mouth 2 (two) times daily as needed for anxiety or sleep.  Marland Kitchen amLODipine (NORVASC) 2.5 MG tablet Take 1 tablet (2.5 mg total) by mouth daily.  Marland Kitchen aspirin 81 MG tablet Take 1 tablet (81 mg total) by mouth daily with breakfast.  . escitalopram (LEXAPRO) 20 MG tablet Take 1.5 tablets (30 mg total) by mouth daily.  Marland Kitchen ezetimibe (ZETIA) 10 MG tablet Take 1 tablet (10 mg total) by mouth daily.  . fluvastatin (LESCOL) 40 MG capsule Take 1 capsule (40 mg total) by mouth daily.  . metoprolol succinate (TOPROL-XL) 25 MG 24 hr tablet TAKE (1) TABLET BY MOUTH AT BEDTIME. NEEDS OFFICE VISIT  . naphazoline-glycerin (CLEAR EYES REDNESS) 0.012-0.2 % SOLN Place 2 drops into both eyes 4 (four) times daily as needed for eye irritation.  . nitroGLYCERIN (NITROSTAT) 0.4 MG SL tablet Dissolve 1 tablet under tongue every 5 mins up to 3 dose in 15 mins for chest pain. If no relief call 911.  . ondansetron (ZOFRAN) 4 MG tablet Take 1 tablet (4 mg total) by mouth every 6 (six) hours as needed for nausea.  . polyethylene glycol (MIRALAX / GLYCOLAX) 17 g packet Take 17 g by mouth daily.  . ramipril (ALTACE) 5  MG capsule Take 1 capsule (5 mg total) by mouth daily.  . traMADol (ULTRAM) 50 MG tablet Take 1 tablet (50 mg total) by mouth every 6 (six) hours as needed for moderate pain.  Marland Kitchen UNABLE TO FIND Diet: NAS  . [DISCONTINUED] diclofenac Sodium (VOLTAREN) 1 % GEL Apply 2 g topically 2 (two) times daily as needed.   No  facility-administered encounter medications on file as of 08/04/2020.     SIGNIFICANT DIAGNOSTIC EXAMS  TODAY  07-31-20: ct of head:  No evidence of acute intracranial hemorrhage or acute infarction. Mild lateral and third ventriculomegaly is at least partially related to cerebral volume loss. However, there are also some imaging findings which may be seen in the setting of normal pressure hydrocephalus and clinical correlation is recommended. Moderate cerebral white matter chronic small vessel ischemic disease.  07-31-20: ct of cervical spine:  1. No evidence of acute fracture or traumatic malalignment. 2. Multilevel degenerative changes, as detailed above. Suspected multilevel moderate to severe foraminal stenosis. MRI could better characterize if clinically indicated.  07-31-20: chest x-ray:  The heart size and mediastinal contours are within normal limits. Both lungs are clear. The visualized skeletal structures are unremarkable  07-31-20: right knee x-rau:  Severe degenerative joint disease. No acute abnormality seen in the right knee  07-31-20: mri of brain:  No acute abnormality Progression of atrophy and chronic microhemorrhage since 2012. Chronic hemorrhage in the thalamus bilaterally. Correlate with hypertension history.  LABS REVIEWED TODAY;   07-31-20: wbc 7.7; hgb 11.8; hct 35.9; mcv 94.7 plt 256; glucose 143; bun 17; creat 1.05; k+ 4.2; na++ 133; ca 9.1 liver normal albumin 3.6 urine culture multiple; hgb a1c 5.7 08-01-20: vit B12: 390; folate 17.0 tsh 3.052 08-02-20: wbc 7.1; hgb 12.6; hct 38.5; mcv 92.8 plt 291; glucose 124; bun 14; creat 0.91; k+ 4.1; na++  133; ca 9.4 chol 139; ldl 69 trig 99; hdl 50     Review of Systems  Constitutional: Negative for malaise/fatigue.  Respiratory: Negative for cough and shortness of breath.   Cardiovascular: Negative for chest pain, palpitations and leg swelling.  Gastrointestinal: Negative for abdominal pain, constipation and heartburn.  Musculoskeletal: Negative for back pain, joint pain and myalgias.  Skin: Negative.   Neurological: Negative for dizziness.  Psychiatric/Behavioral: The patient is not nervous/anxious.     Physical Exam Constitutional:      General: She is not in acute distress.    Appearance: She is well-developed. She is not diaphoretic.  Neck:     Thyroid: No thyromegaly.  Cardiovascular:     Rate and Rhythm: Normal rate and regular rhythm.     Pulses: Normal pulses.     Heart sounds: Normal heart sounds.  Pulmonary:     Effort: Pulmonary effort is normal. No respiratory distress.     Breath sounds: Normal breath sounds.  Abdominal:     General: Bowel sounds are normal. There is no distension.     Palpations: Abdomen is soft.     Tenderness: There is no abdominal tenderness.  Musculoskeletal:        General: Normal range of motion.     Cervical back: Neck supple.     Right lower leg: No edema.     Left lower leg: No edema.  Lymphadenopathy:     Cervical: No cervical adenopathy.  Skin:    General: Skin is warm and dry.  Neurological:     Mental Status: She is alert. Mental status is at baseline.  Psychiatric:        Mood and Affect: Mood normal.        ASSESSMENT/ PLAN:  TODAY  1. Acute metabolic encephalopathy: does have moderate dementia: will continue to monitor her status.   2. Vascular dementia without behavioral disturbance: is moderate: weight is 141 pounds will monitor   3. Cerebrovascular accident unspecified mechanism: is neurologically stable: will  continue asa 81 mg daily   4. Atherosclerosis of native coronary artery of native heart without  angina pectoris: is stable will continue asa 81 mg daily toprol xl 25 mg daily lescol 40 mg daily zetia 10 mg daily has prn ntg.   5. Essential hypertension; benign: is stable b/p 134/52 will continue norvasc 2.5 mg daily altace 5 mg daily toprol xl 25 mg daily asa 81 mg daily   6. Primary osteoarthritis of right knee: has ultram 50 mg every 6 hours as needed  7. Chronic constipation: is stable will continue miralax daily   8. Major depression recurrent chronic: is stable will continue lexapro 30 mg daily xanax 0.25 mg twice daily as needed  9. Hyperlipidemia associated with type 2 diabetes mellitus: is stable LDL 69 will continue lexcol 40 mg daily zetia 10 mg daily   10. Type 2 diabetes mellitus with retinopathy without long term current use of insulin macular edema presence unspecified unspecified laterality unspecified retinopathy severity: is stable hgb a1c 5.7 will monitor is on ace; statin asa  11. Hyponatremia: is stable na++ 133 will monitor     MD is aware of resident's narcotic use and is in agreement with current plan of care. We will attempt to wean resident as appropriate.  Ok Edwards NP [facility]  Contact [phone] Monday through Friday 8am- 5pm  After hours call [phone]

## 2020-08-07 ENCOUNTER — Other Ambulatory Visit: Payer: Self-pay | Admitting: Adult Health

## 2020-08-07 ENCOUNTER — Non-Acute Institutional Stay (SKILLED_NURSING_FACILITY): Payer: PPO | Admitting: Internal Medicine

## 2020-08-07 ENCOUNTER — Encounter: Payer: Self-pay | Admitting: Internal Medicine

## 2020-08-07 DIAGNOSIS — G9341 Metabolic encephalopathy: Secondary | ICD-10-CM | POA: Diagnosis not present

## 2020-08-07 DIAGNOSIS — E785 Hyperlipidemia, unspecified: Secondary | ICD-10-CM

## 2020-08-07 DIAGNOSIS — E1149 Type 2 diabetes mellitus with other diabetic neurological complication: Secondary | ICD-10-CM | POA: Diagnosis not present

## 2020-08-07 DIAGNOSIS — E1169 Type 2 diabetes mellitus with other specified complication: Secondary | ICD-10-CM | POA: Diagnosis not present

## 2020-08-07 MED ORDER — ALPRAZOLAM 0.25 MG PO TABS: 0.2500 mg | ORAL_TABLET | Freq: Two times a day (BID) | ORAL | 0 refills | Status: DC | PRN

## 2020-08-07 NOTE — Assessment & Plan Note (Signed)
Most recent A1c was 5.9% indicating prediabetic status.  No indication for medication.

## 2020-08-07 NOTE — Progress Notes (Signed)
NURSING HOME LOCATION:  [facility] NUMBER:  133 P  CODE STATUS:  Full Code  PCP:  Malena Taylor DO   This is a comprehensive admission note to [facility] performed on this date less than 30 days from date of admission. Included are preadmission medical/surgical history; reconciled medication list; family history; social history and comprehensive review of systems.  Corrections and additions to the records were documented. Comprehensive physical exam was also performed. Additionally a clinical summary was entered for each active diagnosis pertinent to this admission in the Problem List to enhance continuity of care.  HPI: Patient was hospitalized 11/29-12/10/2019, presenting with altered mental status and weakness.  Symptoms initially started approximately 2 weeks prior to admission but were progressively worse in the 4 days PTA.  She was described as exhibiting  reaching for items which were not there, wandering purposelessly, staring blankly, speech trailing off, slow and shuffling gait, and lack of task completion.  The day of admission patient was so weak that her daughter and son had difficulty helping her arise. Clinical diagnosis was acute metabolic encephalopathy in the context of a history of hemorrhagic stroke and history of chronic microhemorrhages associated with moderately advanced dementia without behavioral disturbances.  These changes were associated with urinary urgency and incontinence. Bradycardia was present; lactic acid level was 1.3 and ammonia 9.  Hyponatremia was present with a sodium of 133.   Head CT revealed mild lateral and third ventriculomegaly at least partially related to cerebral volume loss, progressive compared to imaging in 2012 at the time of her hemorrhagic stroke.  Chronic microvascular ischemic changes were present with chronic hemorrhage of the right  thalamus which was stable.  Chronic hemorrhage in the left thalamus was new  compared to the prior imaging.  There is also suggestion of possible normal pressure hydrocephalus.  Cervical spine CT revealed multilevel moderate-severe foraminal stenoses. Neurology consulted and recommended low-dose aspirin and continuing [place]. Triple drug antihypertensive therapy was continued. PT recommended SNF placement for rehab.  Family did not feel the patient could return to her home as her elderly husband cannot meet her healthcare needs.  Past medical and surgical history: Includes coronary atherosclerosis, essential hypertension, history of diverticulitis, GERD, hx of NSAID induced gastric erosions, dyslipidemia, and diabetes with neurologic complications and retinopathy. Surgeries and procedures include breast cyst excision, cardiac cath, colonoscopy, and carotid stenting.  Social history: Lives with elderly husband. Non drinker; former smoker  Family history: extensive hx reviewed; noncontributary due to age.   Review of systems was invalid due to dementia. Date given as December 12, 19??.  She did state that she was weak but could not define where.  When asked why she had been in the hospital her response was "I am confused".  When asked about depression; she stated "I would rather be home".  Constitutional: No fever, significant weight change  Eyes: No redness, discharge, pain, vision change ENT/mouth: No nasal congestion, purulent discharge, earache, change in hearing, sore throat  Cardiovascular: No chest pain, palpitations, paroxysmal nocturnal dyspnea, claudication, edema  Respiratory: No cough, sputum production, hemoptysis, DOE, significant snoring, apnea Gastrointestinal: No heartburn, dysphagia, abdominal pain, nausea /vomiting, rectal bleeding, melena, change in bowels Genitourinary: No dysuria, hematuria, pyuria, incontinence, nocturia Musculoskeletal: No joint stiffness, joint swelling, pain Dermatologic: No rash, pruritus, change in appearance of  skin Neurologic: No dizziness, headache, syncope, seizures, numbness, tingling Psychiatric: No significant insomnia, anorexia Endocrine: No change in hair/skin/nails, excessive thirst, excessive hunger,  excessive urination  Hematologic/lymphatic: No significant bruising, lymphadenopathy, abnormal bleeding Allergy/immunology: No itchy/watery eyes, significant sneezing, urticaria, angioedema  Physical exam:  Pertinent or positive findings: She was not in her room; she was in a wheelchair visiting someone several doors away.  When I asked who her friend was she stated "I don't know".  There may be insignificant anisocoria with the right pupil minimally larger than the left.  She has an upper partial.  There is a fine mandibular tremor.  Heart sounds are distant and slow.  Chest was surprisingly clear.  Abdomen is protuberant.  Dorsalis pedis pulses are stronger than posterior tibial pulses.  She has marked DIP and PIP osteoarthritic changes of the hands.  Strength to opposition was fair in all extremities.  General appearance: no acute distress, increased work of breathing is present.   Lymphatic: No lymphadenopathy about the head, neck, axilla. Eyes: No conjunctival inflammation or lid edema is present. There is no scleral icterus. Ears:  External ear exam shows no significant lesions or deformities.   Nose:  External nasal examination shows no deformity or inflammation. Nasal mucosa are pink and moist without lesions, exudates Oral exam: Lips and gums are healthy appearing.There is no oropharyngeal erythema or exudate. Neck:  No thyromegaly, masses, tenderness noted.    Heart:  No gallop, murmur, click, rub.  Lungs:  without wheezes, rhonchi, rales, rubs. Abdomen: Bowel sounds are normal.  Abdomen is soft and nontender with no organomegaly, hernias, masses. GU: Deferred  Extremities:  No cyanosis, clubbing, edema. Neurologic exam: Balance, Rhomberg, finger to nose testing could not be completed  due to clinical state Skin: Warm & dry w/o tenting. No significant lesions or rash.  See clinical summary under each active problem in the Problem List with associated updated therapeutic plan

## 2020-08-07 NOTE — Assessment & Plan Note (Addendum)
Vascular dementia suggested.  CODE STATUS was discussed with family @ hospital; but she remains a full code.

## 2020-08-07 NOTE — Assessment & Plan Note (Addendum)
Continue Lescol & Zetia as secondary prevention.

## 2020-08-07 NOTE — Patient Instructions (Signed)
See assessment and plan under each diagnosis in the problem list and acutely for this visit 

## 2020-08-08 ENCOUNTER — Non-Acute Institutional Stay (SKILLED_NURSING_FACILITY): Payer: PPO | Admitting: Adult Health

## 2020-08-08 ENCOUNTER — Encounter: Payer: Self-pay | Admitting: Adult Health

## 2020-08-08 DIAGNOSIS — M1711 Unilateral primary osteoarthritis, right knee: Secondary | ICD-10-CM | POA: Diagnosis not present

## 2020-08-08 NOTE — Progress Notes (Unsigned)
Location:    [place] Room Number: 133-P Place of Service:  SNF (31)   CODE STATUS: Full Code  Allergies  Allergen Reactions  . Macrodantin   . Pravachol [Pravastatin Sodium]   . Tetanus Toxoids     Chief Complaint  Patient presents with  . Acute Visit    Pain management     HPI:  She has ordered ultram 50 mg every 6 hours as needed for her right knee osteoarthritis. She has not utilized this medication. She denies any pain; no changes in appetite; no insomnia.   Past Medical History:  Diagnosis Date  . Allergic rhinitis   . Altered mental status    Per records at [facility]   . Cataracts, bilateral   . Cerebral infarct [facility])    Per records at [facility]   . Coronary atherosclerosis of native coronary artery    DES LAD 3/12, 99% nondominant RCA, No CAD otherwise, LVEF 65%  . Coronary atherosclerosis of native coronary artery    Per records at [facility] [place]   . Diabetic retinopathy ([place])   . Disorientation, unspecified    Per records at [facility]   . Diverticulitis   . Essential hypertension   . GERD (gastroesophageal reflux disease)   . Hemorrhoids   . Hyperlipidemia   . Major depressive disorder with single episode    Per records at [facility]   . Metabolic encephalopathy    Per records at [facility]   . Muscle weakness (generalized)    Per records at [facility]   . Stroke, hemorrhagic ([place])    Right thalamic hemorrhage 4/12  . Syncope    Neurally mediated  . Syncope and collapse    Per records at [facility]   . Type 2 diabetes mellitus ([place])   . Type 2 diabetes mellitus with unspecified diabetic retinopathy without macular edema ([place])    Per records at [facility]   . Unspecified dementia without behavioral disturbance ([place])    Per records at [facility]     Past Surgical History:  Procedure Laterality Date  . BREAST CYST EXCISION    .  CARDIAC CATHETERIZATION    . CAROTID STENT  11/16/10  . COLONOSCOPY N/A 10/02/2014   Dr. Rourk:engorged internal hemorrhoids/pancolonic diverticulosis  . ESOPHAGOGASTRODUODENOSCOPY  10/02/14   Dr. Rourk:normal esophagus 2 cm hiatal hernia, gastric erosions likely from NSAID effect    Social History   Socioeconomic History  . Marital status: Married    Spouse name: Not on file  . Number of children: 2  . Years of education: 20  . Highest education level: Not on file  Occupational History  . Occupation: Retired  Tobacco Use  . Smoking status: Former Smoker    Packs/day: 0.25    Types: Cigarettes    Quit date: 09/02/1988    Years since quitting: 31.9  . Smokeless tobacco: Never Used  Vaping Use  . Vaping Use: Never used  Substance and Sexual Activity  . Alcohol use: No    Alcohol/week: 0.0 standard drinks  . Drug use: No  . Sexual activity: Not Currently  Other Topics Concern  . Not on file  Social History Narrative   Married   Right handed   Caffeine use - coffee x 2 cups   Social Determinants of Health   Financial Resource Strain:   . Difficulty of Paying Living Expenses: Not on  file  Food Insecurity:   . Worried About Charity fundraiser in the Last Year: Not on file  . Ran Out of Food in the Last Year: Not on file  Transportation Needs:   . Lack of Transportation (Medical): Not on file  . Lack of Transportation (Non-Medical): Not on file  Physical Activity:   . Days of Exercise per Week: Not on file  . Minutes of Exercise per Session: Not on file  Stress:   . Feeling of Stress : Not on file  Social Connections:   . Frequency of Communication with Friends and Family: Not on file  . Frequency of Social Gatherings with Friends and Family: Not on file  . Attends Religious Services: Not on file  . Active Member of Clubs or Organizations: Not on file  . Attends Archivist Meetings: Not on file  . Marital Status: Not on file  Intimate Partner Violence:   .  Fear of Current or Ex-Partner: Not on file  . Emotionally Abused: Not on file  . Physically Abused: Not on file  . Sexually Abused: Not on file   Family History  Problem Relation Age of Onset  . Pancreatic cancer Mother        Died at age 13  . Diabetes Mother   . Cancer Mother   . Coronary artery disease Father        Died in his 27s  . Heart attack Father   . Hypertension Father   . Breast cancer Sister   . Cancer Sister       VITAL SIGNS BP (!) 126/58   Pulse 60   Temp (!) 97.1 F (36.2 C)   Resp 20   Ht 5\' 1"  (1.549 m)   Wt 142 lb 6.4 oz (64.6 kg)   SpO2 100%   BMI 26.91 kg/m   Outpatient Encounter Medications as of 08/08/2020  Medication Sig  . acetaminophen (TYLENOL) 325 MG tablet Take 2 tablets (650 mg total) by mouth every 4 (four) hours as needed for mild pain, fever or headache (or Fever >/= 101).  Marland Kitchen ALPRAZolam (XANAX) 0.25 MG tablet Take 1 tablet (0.25 mg total) by mouth 2 (two) times daily as needed for anxiety or sleep.  Marland Kitchen amLODipine (NORVASC) 2.5 MG tablet Take 1 tablet (2.5 mg total) by mouth daily.  Marland Kitchen aspirin 81 MG tablet Take 1 tablet (81 mg total) by mouth daily with breakfast.  . escitalopram (LEXAPRO) 20 MG tablet Take 1.5 tablets (30 mg total) by mouth daily.  Marland Kitchen ezetimibe (ZETIA) 10 MG tablet Take 1 tablet (10 mg total) by mouth daily.  . fluvastatin (LESCOL) 40 MG capsule Take 1 capsule (40 mg total) by mouth daily.  . metoprolol succinate (TOPROL-XL) 25 MG 24 hr tablet TAKE (1) TABLET BY MOUTH AT BEDTIME. NEEDS OFFICE VISIT  . naphazoline-glycerin (CLEAR EYES REDNESS) 0.012-0.2 % SOLN Place 2 drops into both eyes 4 (four) times daily as needed for eye irritation.  . nitroGLYCERIN (NITROSTAT) 0.4 MG SL tablet Dissolve 1 tablet under tongue every 5 mins up to 3 dose in 15 mins for chest pain. If no relief call 911.  . ondansetron (ZOFRAN) 4 MG tablet Take 1 tablet (4 mg total) by mouth every 6 (six) hours as needed for nausea.  . polyethylene glycol  (MIRALAX / GLYCOLAX) 17 g packet Take 17 g by mouth daily.  . ramipril (ALTACE) 5 MG capsule Take 1 capsule (5 mg total) by mouth daily.  . [  DISCONTINUED] UNABLE TO FIND Diet: NAS  . traMADol (ULTRAM) 50 MG tablet Take 1 tablet (50 mg total) by mouth every 6 (six) hours as needed for moderate pain.   No facility-administered encounter medications on file as of 08/08/2020.     SIGNIFICANT DIAGNOSTIC EXAMS  PREVIOUS   07-31-20: ct of head:  No evidence of acute intracranial hemorrhage or acute infarction. Mild lateral and third ventriculomegaly is at least partially related to cerebral volume loss. However, there are also some imaging findings which may be seen in the setting of normal pressure hydrocephalus and clinical correlation is recommended. Moderate cerebral white matter chronic small vessel ischemic disease.  07-31-20: ct of cervical spine:  1. No evidence of acute fracture or traumatic malalignment. 2. Multilevel degenerative changes, as detailed above. Suspected multilevel moderate to severe foraminal stenosis. MRI could better characterize if clinically indicated.  07-31-20: chest x-ray:  The heart size and mediastinal contours are within normal limits. Both lungs are clear. The visualized skeletal structures are unremarkable  07-31-20: right knee x-rau:  Severe degenerative joint disease. No acute abnormality seen in the right knee  07-31-20: mri of brain:  No acute abnormality Progression of atrophy and chronic microhemorrhage since 2012. Chronic hemorrhage in the thalamus bilaterally. Correlate with hypertension history.  NO NEW EXAMS.   LABS REVIEWED PREVIOUS;   07-31-20: wbc 7.7; hgb 11.8; hct 35.9; mcv 94.7 plt 256; glucose 143; bun 17; creat 1.05; k+ 4.2; na++ 133; ca 9.1 liver normal albumin 3.6 urine culture multiple; hgb a1c 5.7 08-01-20: vit B12: 390; folate 17.0 tsh 3.052 08-02-20: wbc 7.1; hgb 12.6; hct 38.5; mcv 92.8 plt 291; glucose 124; bun 14; creat  0.91; k+ 4.1; na++ 133; ca 9.4 chol 139; ldl 69 trig 99; hdl 50   NO NEW LABS.     Review of Systems  Constitutional: Negative for malaise/fatigue.  Respiratory: Negative for cough and shortness of breath.   Cardiovascular: Negative for chest pain, palpitations and leg swelling.  Gastrointestinal: Negative for abdominal pain, constipation and heartburn.  Musculoskeletal: Negative for back pain, joint pain and myalgias.  Skin: Negative.   Neurological: Negative for dizziness.  Psychiatric/Behavioral: The patient is not nervous/anxious.     Physical Exam Constitutional:      General: She is not in acute distress.    Appearance: She is well-developed. She is not diaphoretic.  Neck:     Thyroid: No thyromegaly.  Cardiovascular:     Rate and Rhythm: Normal rate and regular rhythm.     Pulses: Normal pulses.     Heart sounds: Normal heart sounds.  Pulmonary:     Effort: Pulmonary effort is normal. No respiratory distress.     Breath sounds: Normal breath sounds.  Abdominal:     General: Bowel sounds are normal. There is no distension.     Palpations: Abdomen is soft.     Tenderness: There is no abdominal tenderness.  Musculoskeletal:        General: Normal range of motion.     Cervical back: Neck supple.     Right lower leg: No edema.     Left lower leg: No edema.  Lymphadenopathy:     Cervical: No cervical adenopathy.  Skin:    General: Skin is warm and dry.  Neurological:     Mental Status: She is alert. Mental status is at baseline.  Psychiatric:        Mood and Affect: Mood normal.      ASSESSMENT/ PLAN:  TODAY  1. Primary osteoarthritis right knee: she is stable will stop ultram due to non-use will monitor   MD is aware of resident's narcotic use and is in agreement with current plan of care. We will attempt to wean resident as appropriate.  Ok Edwards NP [facility]  Contact [phone] Monday through Friday 8am- 5pm  After hours call  [phone]

## 2020-08-09 ENCOUNTER — Ambulatory Visit: Payer: PPO | Admitting: Family Medicine

## 2020-08-10 DIAGNOSIS — Z029 Encounter for administrative examinations, unspecified: Secondary | ICD-10-CM

## 2020-08-11 ENCOUNTER — Non-Acute Institutional Stay (SKILLED_NURSING_FACILITY): Payer: PPO | Admitting: Adult Health

## 2020-08-11 ENCOUNTER — Encounter: Payer: Self-pay | Admitting: Adult Health

## 2020-08-11 DIAGNOSIS — F0150 Vascular dementia without behavioral disturbance: Secondary | ICD-10-CM | POA: Diagnosis not present

## 2020-08-11 DIAGNOSIS — I639 Cerebral infarction, unspecified: Secondary | ICD-10-CM

## 2020-08-11 DIAGNOSIS — I2510 Atherosclerotic heart disease of native coronary artery without angina pectoris: Secondary | ICD-10-CM

## 2020-08-11 NOTE — Progress Notes (Signed)
Location:  [place] Room Number: 133-P Place of Service:  SNF (31)   CODE STATUS: FULL CODE  Allergies  Allergen Reactions  . Macrodantin   . Pravachol [Pravastatin Sodium]   . Tetanus Toxoids     Chief Complaint  Patient presents with  . Acute Visit         Vascular dementia without behavioral disturbance:    Cerebrovascular accident unspecified mechanism:  Atherosclerosis of native coronary artery of native heart without angina pectoris  Weekly follow up for the first 30 days post hospitalization.     HPI:  She is a 86 year old short term rehab patient being seen for the management of her chronic illnesses: Vascular dementia without behavioral disturbance:    Cerebrovascular accident unspecified mechanism:  Atherosclerosis of native coronary artery of native heart without angina pectoris. There are no reports of uncontrolled pain; is off ultram. No reports of agitation; no reports of insomnia.   Past Medical History:  Diagnosis Date  . Allergic rhinitis   . Altered mental status    Per records at [facility]   . Cataracts, bilateral   . Cerebral infarct [facility])    Per records at [facility]   . Coronary atherosclerosis of native coronary artery    DES LAD 3/12, 99% nondominant RCA, No CAD otherwise, LVEF 65%  . Coronary atherosclerosis of native coronary artery    Per records at [facility]   . Diabetic retinopathy ([place])   . Disorientation, unspecified    Per records at [facility]   . Diverticulitis   . Essential hypertension   . GERD (gastroesophageal reflux disease)   . Hemorrhoids   . Hyperlipidemia   . Major depressive disorder with single episode    Per records at [facility]   . Metabolic encephalopathy    Per records at [facility]   . Muscle weakness (generalized)    Per records at [facility]   . Stroke, hemorrhagic ([place])    Right thalamic hemorrhage 4/12  . Syncope     Neurally mediated  . Syncope and collapse    Per records at [facility]   . Type 2 diabetes mellitus ([place])   . Type 2 diabetes mellitus with unspecified diabetic retinopathy without macular edema ([place])    Per records at [facility]   . Unspecified dementia without behavioral disturbance ([place])    Per records at [facility]     Past Surgical History:  Procedure Laterality Date  . BREAST CYST EXCISION    . CARDIAC CATHETERIZATION    . CAROTID STENT  11/16/10  . COLONOSCOPY N/A 10/02/2014   Dr. Rourk:engorged internal hemorrhoids/pancolonic diverticulosis  . ESOPHAGOGASTRODUODENOSCOPY  10/02/14   Dr. Rourk:normal esophagus 2 cm hiatal hernia, gastric erosions likely from NSAID effect    Social History   Socioeconomic History  . Marital status: Married    Spouse name: Not on file  . Number of children: 2  . Years of education: 62  . Highest education level: Not on file  Occupational History  . Occupation: Retired  Tobacco Use  . Smoking status: Former Smoker    Packs/day: 0.25    Types: Cigarettes    Quit date: 09/02/1988    Years since quitting: 31.9  . Smokeless tobacco: Never Used  Vaping Use  . Vaping Use: Never used  Substance and Sexual Activity  . Alcohol use: No  Alcohol/week: 0.0 standard drinks  . Drug use: No  . Sexual activity: Not Currently  Other Topics Concern  . Not on file  Social History Narrative   Married   Right handed   Caffeine use - coffee x 2 cups   Social Determinants of Health   Financial Resource Strain: Not on file  Food Insecurity: Not on file  Transportation Needs: Not on file  Physical Activity: Not on file  Stress: Not on file  Social Connections: Not on file  Intimate Partner Violence: Not on file   Family History  Problem Relation Age of Onset  . Pancreatic cancer Mother        Died at age 24  . Diabetes Mother   . Cancer Mother   . Coronary artery disease Father        Died in his 20s  . Heart  attack Father   . Hypertension Father   . Breast cancer Sister   . Cancer Sister       VITAL SIGNS There were no vitals taken for this visit.  Outpatient Encounter Medications as of 08/11/2020  Medication Sig  . acetaminophen (TYLENOL) 325 MG tablet Take 2 tablets (650 mg total) by mouth every 4 (four) hours as needed for mild pain, fever or headache (or Fever >/= 101).  Marland Kitchen ALPRAZolam (XANAX) 0.25 MG tablet Take 1 tablet (0.25 mg total) by mouth 2 (two) times daily as needed for anxiety or sleep.  Marland Kitchen amLODipine (NORVASC) 2.5 MG tablet Take 1 tablet (2.5 mg total) by mouth daily.  Marland Kitchen aspirin 81 MG tablet Take 1 tablet (81 mg total) by mouth daily with breakfast.  . escitalopram (LEXAPRO) 20 MG tablet Take 1.5 tablets (30 mg total) by mouth daily.  Marland Kitchen ezetimibe (ZETIA) 10 MG tablet Take 1 tablet (10 mg total) by mouth daily.  . fluvastatin (LESCOL) 40 MG capsule Take 1 capsule (40 mg total) by mouth daily.  . metoprolol succinate (TOPROL-XL) 25 MG 24 hr tablet TAKE (1) TABLET BY MOUTH AT BEDTIME. NEEDS OFFICE VISIT  . naphazoline-glycerin (CLEAR EYES REDNESS) 0.012-0.2 % SOLN Place 2 drops into both eyes 4 (four) times daily as needed for eye irritation.  . nitroGLYCERIN (NITROSTAT) 0.4 MG SL tablet Dissolve 1 tablet under tongue every 5 mins up to 3 dose in 15 mins for chest pain. If no relief call 911.  . ondansetron (ZOFRAN) 4 MG tablet Take 1 tablet (4 mg total) by mouth every 6 (six) hours as needed for nausea.  . polyethylene glycol (MIRALAX / GLYCOLAX) 17 g packet Take 17 g by mouth daily.  . ramipril (ALTACE) 5 MG capsule Take 1 capsule (5 mg total) by mouth daily.  . traMADol (ULTRAM) 50 MG tablet Take 1 tablet (50 mg total) by mouth every 6 (six) hours as needed for moderate pain.   No facility-administered encounter medications on file as of 08/11/2020.     SIGNIFICANT DIAGNOSTIC EXAMS   PREVIOUS   07-31-20: ct of head:  No evidence of acute intracranial hemorrhage or acute  infarction. Mild lateral and third ventriculomegaly is at least partially related to cerebral volume loss. However, there are also some imaging findings which may be seen in the setting of normal pressure hydrocephalus and clinical correlation is recommended. Moderate cerebral white matter chronic small vessel ischemic disease.  07-31-20: ct of cervical spine:  1. No evidence of acute fracture or traumatic malalignment. 2. Multilevel degenerative changes, as detailed above. Suspected multilevel moderate to severe foraminal stenosis.  MRI could better characterize if clinically indicated.  07-31-20: chest x-ray:  The heart size and mediastinal contours are within normal limits. Both lungs are clear. The visualized skeletal structures are unremarkable  07-31-20: right knee x-rau:  Severe degenerative joint disease. No acute abnormality seen in the right knee  07-31-20: mri of brain:  No acute abnormality Progression of atrophy and chronic microhemorrhage since 2012. Chronic hemorrhage in the thalamus bilaterally. Correlate with hypertension history.  NO NEW EXAMS.   LABS REVIEWED PREVIOUS;   07-31-20: wbc 7.7; hgb 11.8; hct 35.9; mcv 94.7 plt 256; glucose 143; bun 17; creat 1.05; k+ 4.2; na++ 133; ca 9.1 liver normal albumin 3.6 urine culture multiple; hgb a1c 5.7 08-01-20: vit B12: 390; folate 17.0 tsh 3.052 08-02-20: wbc 7.1; hgb 12.6; hct 38.5; mcv 92.8 plt 291; glucose 124; bun 14; creat 0.91; k+ 4.1; na++ 133; ca 9.4 chol 139; ldl 69 trig 99; hdl 50   NO NEW LABS.    Review of Systems  Constitutional: Negative for malaise/fatigue.  Respiratory: Negative for cough.   Cardiovascular: Negative for chest pain.  Gastrointestinal: Negative for abdominal pain and constipation.  Musculoskeletal: Negative for back pain.  Skin: Negative.   Neurological: Negative for dizziness.  Psychiatric/Behavioral: The patient is not nervous/anxious.       Physical Exam Constitutional:       General: She is not in acute distress.    Appearance: She is well-developed and well-nourished. She is not diaphoretic.  Neck:     Thyroid: No thyromegaly.  Cardiovascular:     Rate and Rhythm: Normal rate and regular rhythm.     Pulses: Normal pulses and intact distal pulses.     Heart sounds: Normal heart sounds.  Pulmonary:     Effort: Pulmonary effort is normal. No respiratory distress.     Breath sounds: Normal breath sounds.  Abdominal:     General: Bowel sounds are normal. There is no distension.     Palpations: Abdomen is soft.     Tenderness: There is no abdominal tenderness.  Musculoskeletal:        General: Normal range of motion.     Cervical back: Neck supple.     Right lower leg: No edema.     Left lower leg: No edema.  Lymphadenopathy:     Cervical: No cervical adenopathy.  Skin:    General: Skin is warm and dry.  Neurological:     Mental Status: She is alert. Mental status is at baseline.  Psychiatric:        Mood and Affect: Mood and affect and mood normal.      ASSESSMENT/ PLAN:   TODAY  1. Vascular dementia without behavioral disturbance: is moderate weight is 139 pounds will monitor   2. Cerebrovascular accident unspecified mechanism: is stable will continue asa 81 mg daily   3. Atherosclerosis of native coronary artery of native heart without angina pectoris: is stable will continue asa 81 mg daily toprol xl 25 mg daily lescol 40 mg daily zetia 10 mg daily has prn ntg.    PREVIOUS    4. Essential hypertension; benign: is stable b/p 134/52 will continue norvasc 2.5 mg daily altace 5 mg daily toprol xl 25 mg daily asa 81 mg daily   5. Primary osteoarthritis of right knee: is stable will monitor   6. Chronic constipation: is stable will continue miralax daily   7. Major depression recurrent chronic: is stable will continue lexapro 30 mg daily xanax 0.25 mg  twice daily as needed  8. Hyperlipidemia associated with type 2 diabetes mellitus: is  stable LDL 69 will continue lexcol 40 mg daily zetia 10 mg daily   9. Type 2 diabetes mellitus with retinopathy without long term current use of insulin macular edema presence unspecified unspecified laterality unspecified retinopathy severity: is stable hgb a1c 5.7 will monitor is on ace; statin asa  10. Hyponatremia: is stable na++ 133 will monitor       MD is aware of resident's narcotic use and is in agreement with current plan of care. We will attempt to wean resident as appropriate.  Ok Edwards NP [facility]  Contact [phone] Monday through Friday 8am- 5pm  After hours call [phone]

## 2020-08-15 ENCOUNTER — Other Ambulatory Visit: Payer: Self-pay | Admitting: Adult Health

## 2020-08-15 ENCOUNTER — Encounter: Payer: Self-pay | Admitting: Adult Health

## 2020-08-15 ENCOUNTER — Non-Acute Institutional Stay: Payer: Self-pay | Admitting: Adult Health

## 2020-08-15 DIAGNOSIS — F0150 Vascular dementia without behavioral disturbance: Secondary | ICD-10-CM

## 2020-08-15 MED ORDER — ALPRAZOLAM 0.25 MG PO TABS: 0.2500 mg | ORAL_TABLET | Freq: Every day | ORAL | 0 refills | Status: DC | PRN

## 2020-08-15 NOTE — Progress Notes (Signed)
Location:  [place] Room Number: 133-P Place of Service:  SNF (31)   CODE STATUS: Full Code   Allergies  Allergen Reactions   Macrodantin    Pravachol [Pravastatin Sodium]    Tetanus Toxoids     Chief Complaint  Patient presents with   Acute Visit    Medication review     HPI:  She is presently taking xanax 0.25 mg nightly as needed for anxiety through 08-29-20. She has not used this medication. There are no reports of insomnia; no anxiety; no depressive thoughts. The care plan feels that since she has not used this medication to allow it to expire.   Past Medical History:  Diagnosis Date   Allergic rhinitis    Altered mental status    Per records at [facility]    Cataracts, bilateral    Cerebral infarct [facility])    Per records at [facility]    Coronary atherosclerosis of native coronary artery    DES LAD 3/12, 99% nondominant RCA, No CAD otherwise, LVEF 65%   Coronary atherosclerosis of native coronary artery    Per records at [facility]    Diabetic retinopathy ([place])    Disorientation, unspecified    Per records at [facility]    Diverticulitis    Essential hypertension    GERD (gastroesophageal reflux disease)    Hemorrhoids    Hyperlipidemia    Major depressive disorder with single episode    Per records at [facility]    Metabolic encephalopathy    Per records at [facility]    Muscle weakness (generalized)    Per records at [facility]    Stroke, hemorrhagic [facility])    Right thalamic hemorrhage 4/12   Syncope    Neurally mediated   Syncope and collapse    Per records at [facility]    Type 2 diabetes mellitus ([place])    Type 2 diabetes mellitus with unspecified diabetic retinopathy without macular edema ([place])    Per records at [facility]    Unspecified dementia without behavioral disturbance ([place])    Per records at [facility]     Past Surgical History:  Procedure Laterality Date   BREAST CYST EXCISION     CARDIAC CATHETERIZATION     CAROTID STENT  11/16/10   COLONOSCOPY N/A 10/02/2014   Dr. Rourk:engorged internal hemorrhoids/pancolonic diverticulosis   ESOPHAGOGASTRODUODENOSCOPY  10/02/14   Dr. Rourk:normal esophagus 2 cm hiatal hernia, gastric erosions likely from NSAID effect    Social History   Socioeconomic History   Marital status: Married    Spouse name: Not on file   Number of children: 2   Years of education: 12   Highest education level: Not on file  Occupational History   Occupation: Retired  Tobacco Use   Smoking status: Former Smoker    Packs/day: 0.25    Types: Cigarettes    Quit date: 09/02/1988    Years since quitting: 31.9   Smokeless tobacco: Never Used  Vaping Use   Vaping Use: Never used  Substance and Sexual Activity   Alcohol use: No    Alcohol/week: 0.0 standard drinks   Drug use: No   Sexual activity: Not Currently  Other Topics Concern   Not on file  Social History Narrative   Married   Right handed   Caffeine use - coffee x 2 cups   Social Determinants of Health  Financial Resource Strain: Not on file  Food Insecurity: Not on file  Transportation Needs: Not on file  Physical Activity: Not on file  Stress: Not on file  Social Connections: Not on file  Intimate Partner Violence: Not on file   Family History  Problem Relation Age of Onset   Pancreatic cancer Mother        Died at age 21   Diabetes Mother    Cancer Mother    Coronary artery disease Father        Died in his 58s   Heart attack Father    Hypertension Father    Breast cancer Sister    Cancer Sister       VITAL SIGNS BP (!) 125/54    Pulse (!) 52    Temp 98 F (36.7 C)    Resp 18    Ht 5\' 1"  (1.549 m)    Wt 139 lb 9.6 oz (63.3 kg)    SpO2 100%    BMI 26.38 kg/m   Outpatient Encounter Medications as of 08/15/2020  Medication Sig   acetaminophen  (TYLENOL) 325 MG tablet Take 2 tablets (650 mg total) by mouth every 4 (four) hours as needed for mild pain, fever or headache (or Fever >/= 101).   ALPRAZolam (XANAX) 0.25 MG tablet Take 1 tablet (0.25 mg total) by mouth 2 (two) times daily as needed for anxiety or sleep.   amLODipine (NORVASC) 2.5 MG tablet Take 1 tablet (2.5 mg total) by mouth daily.   aspirin 81 MG tablet Take 1 tablet (81 mg total) by mouth daily with breakfast.   escitalopram (LEXAPRO) 20 MG tablet Take 1.5 tablets (30 mg total) by mouth daily.   ezetimibe (ZETIA) 10 MG tablet Take 1 tablet (10 mg total) by mouth daily.   fluvastatin (LESCOL) 40 MG capsule Take 1 capsule (40 mg total) by mouth daily.   metoprolol succinate (TOPROL-XL) 25 MG 24 hr tablet TAKE (1) TABLET BY MOUTH AT BEDTIME. NEEDS OFFICE VISIT   naphazoline-glycerin (CLEAR EYES REDNESS) 0.012-0.2 % SOLN Place 2 drops into both eyes 4 (four) times daily as needed for eye irritation.   nitroGLYCERIN (NITROSTAT) 0.4 MG SL tablet Dissolve 1 tablet under tongue every 5 mins up to 3 dose in 15 mins for chest pain. If no relief call 911.   ondansetron (ZOFRAN) 4 MG tablet Take 1 tablet (4 mg total) by mouth every 6 (six) hours as needed for nausea.   polyethylene glycol (MIRALAX / GLYCOLAX) 17 g packet Take 17 g by mouth daily.   ramipril (ALTACE) 5 MG capsule Take 1 capsule (5 mg total) by mouth daily.   No facility-administered encounter medications on file as of 08/15/2020.     SIGNIFICANT DIAGNOSTIC EXAMS   PREVIOUS   07-31-20: ct of head:  No evidence of acute intracranial hemorrhage or acute infarction. Mild lateral and third ventriculomegaly is at least partially related to cerebral volume loss. However, there are also some imaging findings which may be seen in the setting of normal pressure hydrocephalus and clinical correlation is recommended. Moderate cerebral white matter chronic small vessel ischemic disease.  07-31-20: ct of  cervical spine:  1. No evidence of acute fracture or traumatic malalignment. 2. Multilevel degenerative changes, as detailed above. Suspected multilevel moderate to severe foraminal stenosis. MRI could better characterize if clinically indicated.  07-31-20: chest x-ray:  The heart size and mediastinal contours are within normal limits. Both lungs are clear. The visualized skeletal structures are unremarkable  07-31-20: right knee x-rau:  Severe degenerative joint disease. No acute abnormality seen in the right knee  07-31-20: mri of brain:  No acute abnormality Progression of atrophy and chronic microhemorrhage since 2012. Chronic hemorrhage in the thalamus bilaterally. Correlate with hypertension history.  NO NEW EXAMS.   LABS REVIEWED PREVIOUS;   07-31-20: wbc 7.7; hgb 11.8; hct 35.9; mcv 94.7 plt 256; glucose 143; bun 17; creat 1.05; k+ 4.2; na++ 133; ca 9.1 liver normal albumin 3.6 urine culture multiple; hgb a1c 5.7 08-01-20: vit B12: 390; folate 17.0 tsh 3.052 08-02-20: wbc 7.1; hgb 12.6; hct 38.5; mcv 92.8 plt 291; glucose 124; bun 14; creat 0.91; k+ 4.1; na++ 133; ca 9.4 chol 139; ldl 69 trig 99; hdl 50   NO NEW LABS.   Review of Systems  Constitutional: Negative for malaise/fatigue.  Respiratory: Negative for cough.   Cardiovascular: Negative for chest pain and leg swelling.  Gastrointestinal: Negative for constipation and heartburn.  Musculoskeletal: Negative for back pain.  Skin: Negative.   Neurological: Negative for dizziness.  Psychiatric/Behavioral: The patient is not nervous/anxious.     Physical Exam Constitutional:      General: She is not in acute distress.    Appearance: She is well-developed and well-nourished. She is not diaphoretic.  Neck:     Thyroid: No thyromegaly.  Cardiovascular:     Rate and Rhythm: Normal rate and regular rhythm.     Pulses: Normal pulses and intact distal pulses.     Heart sounds: Normal heart sounds.  Pulmonary:      Effort: Pulmonary effort is normal. No respiratory distress.     Breath sounds: Normal breath sounds.  Abdominal:     General: Bowel sounds are normal. There is no distension.     Palpations: Abdomen is soft.     Tenderness: There is no abdominal tenderness.  Musculoskeletal:        General: No edema. Normal range of motion.     Cervical back: Neck supple.     Right lower leg: No edema.     Left lower leg: No edema.  Lymphadenopathy:     Cervical: No cervical adenopathy.  Skin:    General: Skin is warm and dry.  Neurological:     Mental Status: She is alert. Mental status is at baseline.  Psychiatric:        Mood and Affect: Mood and affect and mood normal.       ASSESSMENT/ PLAN:  TODAY  1. Vascular dementia without behavioral disturbance  She is stable will allow the xanax order to expire and will monitor her response   MD is aware of resident's narcotic use and is in agreement with current plan of care. We will attempt to wean resident as appropriate.  Ok Edwards NP [facility]  Contact [phone] Monday through Friday 8am- 5pm  After hours call [phone]

## 2020-08-17 ENCOUNTER — Non-Acute Institutional Stay (SKILLED_NURSING_FACILITY): Payer: PPO | Admitting: Adult Health

## 2020-08-17 ENCOUNTER — Encounter: Payer: Self-pay | Admitting: Adult Health

## 2020-08-17 DIAGNOSIS — F0150 Vascular dementia without behavioral disturbance: Secondary | ICD-10-CM | POA: Diagnosis not present

## 2020-08-17 DIAGNOSIS — F339 Major depressive disorder, recurrent, unspecified: Secondary | ICD-10-CM

## 2020-08-17 DIAGNOSIS — I10 Essential (primary) hypertension: Secondary | ICD-10-CM

## 2020-08-17 NOTE — Progress Notes (Signed)
Location:  [place] Room Number: 133/P Place of Service:  SNF (31)   CODE STATUS: Full Code  Allergies  Allergen Reactions   Macrodantin    Pravachol [Pravastatin Sodium]    Tetanus Toxoids     Chief Complaint  Patient presents with   Acute Visit    Care Plan Meeting     HPI:  We have come together for her care plan meeting. Family present. BIMS 7/15 mood 4/30. She requires limited to extensive assist with adls. Is able to feed herself. Is continue to bowel and frequently incontinent of bladder. She has had one fall with a scratch on her lower back. She is ambulating 100 feet; is taking stairs with supervision. She continues to work with therapy. Her goal is to return back home. She continues to be followed for her chronic illnesses including: Vascular dementia without behavioral disturbance  Major depression recurrent chronic   Essential hypertension benign  Past Medical History:  Diagnosis Date   Allergic rhinitis    Altered mental status    Per records at [place], bilateral    Cerebral infarct [facility])    Per records at [facility]    Coronary atherosclerosis of native coronary artery    DES LAD 3/12, 99% nondominant RCA, No CAD otherwise, LVEF 65%   Coronary atherosclerosis of native coronary artery    Per records at [facility]    Diabetic retinopathy ([place])    Disorientation, unspecified    Per records at [facility]    Diverticulitis    Essential hypertension    GERD (gastroesophageal reflux disease)    Hemorrhoids    Hyperlipidemia    Major depressive disorder with single episode    Per records at [facility]    Metabolic encephalopathy    Per records at [facility]    Muscle weakness (generalized)    Per records at [facility]    Stroke, hemorrhagic [facility])    Right thalamic hemorrhage 4/12   Syncope    Neurally mediated   Syncope and  collapse    Per records at [facility]    Type 2 diabetes mellitus ([place])    Type 2 diabetes mellitus with unspecified diabetic retinopathy without macular edema ([place])    Per records at [facility]    Unspecified dementia without behavioral disturbance [facility])    Per records at [facility]     Past Surgical History:  Procedure Laterality Date   BREAST CYST EXCISION     CARDIAC CATHETERIZATION     CAROTID STENT  11/16/10   COLONOSCOPY N/A 10/02/2014   Dr. Rourk:engorged internal hemorrhoids/pancolonic diverticulosis   ESOPHAGOGASTRODUODENOSCOPY  10/02/14   Dr. Rourk:normal esophagus 2 cm hiatal hernia, gastric erosions likely from NSAID effect    Social History   Socioeconomic History   Marital status: Married    Spouse name: Not on file   Number of children: 2   Years of education: 12   Highest education level: Not on file  Occupational History   Occupation: Retired  Tobacco Use   Smoking status: Former Smoker    Packs/day: 0.25    Types: Cigarettes    Quit date: 09/02/1988    Years since quitting: 31.9   Smokeless tobacco: Never Used  Vaping Use   Vaping Use: Never used  Substance and Sexual Activity   Alcohol use: No    Alcohol/week: 0.0  standard drinks   Drug use: No   Sexual activity: Not Currently  Other Topics Concern   Not on file  Social History Narrative   Married   Right handed   Caffeine use - coffee x 2 cups   Social Determinants of Health   Financial Resource Strain: Not on file  Food Insecurity: Not on file  Transportation Needs: Not on file  Physical Activity: Not on file  Stress: Not on file  Social Connections: Not on file  Intimate Partner Violence: Not on file   Family History  Problem Relation Age of Onset   Pancreatic cancer Mother        Died at age 70   Diabetes Mother    Cancer Mother    Coronary artery disease Father        Died in his 53s   Heart attack Father    Hypertension  Father    Breast cancer Sister    Cancer Sister       VITAL SIGNS BP (!) 125/54    Pulse (!) 52    Temp 98 F (36.7 C)    Resp 18    Ht 5\' 1"  (1.549 m)    Wt 139 lb 9.6 oz (63.3 kg)    SpO2 100%    BMI 26.38 kg/m   Outpatient Encounter Medications as of 08/17/2020  Medication Sig   acetaminophen (TYLENOL) 325 MG tablet Take 2 tablets (650 mg total) by mouth every 4 (four) hours as needed for mild pain, fever or headache (or Fever >/= 101).   ALPRAZolam (XANAX) 0.25 MG tablet Take 0.25 mg by mouth at bedtime as needed for anxiety.   amLODipine (NORVASC) 2.5 MG tablet Take 1 tablet (2.5 mg total) by mouth daily.   aspirin 81 MG tablet Take 1 tablet (81 mg total) by mouth daily with breakfast.   escitalopram (LEXAPRO) 20 MG tablet Take 1.5 tablets (30 mg total) by mouth daily.   ezetimibe (ZETIA) 10 MG tablet Take 1 tablet (10 mg total) by mouth daily.   fluvastatin (LESCOL) 40 MG capsule Take 1 capsule (40 mg total) by mouth daily.   metoprolol succinate (TOPROL-XL) 25 MG 24 hr tablet TAKE (1) TABLET BY MOUTH AT BEDTIME. NEEDS OFFICE VISIT   naphazoline-glycerin (CLEAR EYES REDNESS) 0.012-0.2 % SOLN Place 2 drops into both eyes 4 (four) times daily as needed for eye irritation.   nitroGLYCERIN (NITROSTAT) 0.4 MG SL tablet Dissolve 1 tablet under tongue every 5 mins up to 3 dose in 15 mins for chest pain. If no relief call 911.   ondansetron (ZOFRAN) 4 MG tablet Take 1 tablet (4 mg total) by mouth every 6 (six) hours as needed for nausea.   polyethylene glycol (MIRALAX / GLYCOLAX) 17 g packet Take 17 g by mouth daily.   ramipril (ALTACE) 5 MG capsule Take 1 capsule (5 mg total) by mouth daily.   [DISCONTINUED] ALPRAZolam (XANAX) 0.25 MG tablet Take 1 tablet (0.25 mg total) by mouth daily as needed for up to 14 days for anxiety or sleep.   No facility-administered encounter medications on file as of 08/17/2020.     SIGNIFICANT DIAGNOSTIC EXAMS   PREVIOUS   07-31-20:  ct of head:  No evidence of acute intracranial hemorrhage or acute infarction. Mild lateral and third ventriculomegaly is at least partially related to cerebral volume loss. However, there are also some imaging findings which may be seen in the setting of normal pressure hydrocephalus and clinical correlation is recommended.  Moderate cerebral white matter chronic small vessel ischemic disease.  07-31-20: ct of cervical spine:  1. No evidence of acute fracture or traumatic malalignment. 2. Multilevel degenerative changes, as detailed above. Suspected multilevel moderate to severe foraminal stenosis. MRI could better characterize if clinically indicated.  07-31-20: chest x-ray:  The heart size and mediastinal contours are within normal limits. Both lungs are clear. The visualized skeletal structures are unremarkable  07-31-20: right knee x-rau:  Severe degenerative joint disease. No acute abnormality seen in the right knee  07-31-20: mri of brain:  No acute abnormality Progression of atrophy and chronic microhemorrhage since 2012. Chronic hemorrhage in the thalamus bilaterally. Correlate with hypertension history.  NO NEW EXAMS.   LABS REVIEWED PREVIOUS;   07-31-20: wbc 7.7; hgb 11.8; hct 35.9; mcv 94.7 plt 256; glucose 143; bun 17; creat 1.05; k+ 4.2; na++ 133; ca 9.1 liver normal albumin 3.6 urine culture multiple; hgb a1c 5.7 08-01-20: vit B12: 390; folate 17.0 tsh 3.052 08-02-20: wbc 7.1; hgb 12.6; hct 38.5; mcv 92.8 plt 291; glucose 124; bun 14; creat 0.91; k+ 4.1; na++ 133; ca 9.4 chol 139; ldl 69 trig 99; hdl 50   NO NEW LABS.   Review of Systems  Constitutional: Negative for malaise/fatigue.  Respiratory: Negative for cough and shortness of breath.   Cardiovascular: Negative for chest pain, palpitations and leg swelling.  Gastrointestinal: Negative for abdominal pain, constipation and heartburn.  Musculoskeletal: Negative for back pain, joint pain and myalgias.  Skin:  Negative.   Neurological: Negative for dizziness.  Psychiatric/Behavioral: The patient is not nervous/anxious.     Physical Exam Constitutional:      General: She is not in acute distress.    Appearance: She is well-developed and well-nourished. She is not diaphoretic.  Neck:     Thyroid: No thyromegaly.  Cardiovascular:     Rate and Rhythm: Normal rate and regular rhythm.     Pulses: Normal pulses and intact distal pulses.     Heart sounds: Normal heart sounds.  Pulmonary:     Effort: Pulmonary effort is normal. No respiratory distress.     Breath sounds: Normal breath sounds.  Abdominal:     General: Bowel sounds are normal. There is no distension.     Palpations: Abdomen is soft.     Tenderness: There is no abdominal tenderness.  Musculoskeletal:        General: No edema. Normal range of motion.     Cervical back: Neck supple.     Right lower leg: No edema.     Left lower leg: No edema.  Lymphadenopathy:     Cervical: No cervical adenopathy.  Skin:    General: Skin is warm and dry.  Neurological:     Mental Status: She is alert. Mental status is at baseline.  Psychiatric:        Mood and Affect: Mood and affect and mood normal.       ASSESSMENT/ PLAN:  TODAY  1. Vascular dementia without behavioral disturbance 2. Major depression recurrent chronic  3. Essential hypertension benign  Will continue current medications Will continue therapy as directed Will continue current plan of care Her goal is to return back home    MD is aware of resident's narcotic use and is in agreement with current plan of care. We will attempt to wean resident as appropriate.  Ok Edwards NP [facility]  Contact [phone] Monday through Friday 8am- 5pm  After hours call [phone]

## 2020-08-18 ENCOUNTER — Other Ambulatory Visit: Payer: Self-pay | Admitting: Adult Health

## 2020-08-18 ENCOUNTER — Non-Acute Institutional Stay (SKILLED_NURSING_FACILITY): Payer: PPO | Admitting: Adult Health

## 2020-08-18 ENCOUNTER — Encounter: Payer: Self-pay | Admitting: Adult Health

## 2020-08-18 DIAGNOSIS — F0150 Vascular dementia without behavioral disturbance: Secondary | ICD-10-CM

## 2020-08-18 DIAGNOSIS — E1149 Type 2 diabetes mellitus with other diabetic neurological complication: Secondary | ICD-10-CM

## 2020-08-18 DIAGNOSIS — G9341 Metabolic encephalopathy: Secondary | ICD-10-CM

## 2020-08-18 DIAGNOSIS — I639 Cerebral infarction, unspecified: Secondary | ICD-10-CM

## 2020-08-18 DIAGNOSIS — F32A Depression, unspecified: Secondary | ICD-10-CM

## 2020-08-18 DIAGNOSIS — F339 Major depressive disorder, recurrent, unspecified: Secondary | ICD-10-CM

## 2020-08-18 MED ORDER — ESCITALOPRAM OXALATE 20 MG PO TABS: 30.0000 mg | ORAL_TABLET | Freq: Every day | ORAL | 0 refills | Status: DC

## 2020-08-18 MED ORDER — RAMIPRIL 5 MG PO CAPS: 5.0000 mg | ORAL_CAPSULE | Freq: Every day | ORAL | 0 refills | Status: DC

## 2020-08-18 MED ORDER — ALPRAZOLAM 0.25 MG PO TABS: 0.2500 mg | ORAL_TABLET | Freq: Every evening | ORAL | 0 refills | Status: DC | PRN

## 2020-08-18 MED ORDER — NITROGLYCERIN 0.4 MG SL SUBL: SUBLINGUAL_TABLET | SUBLINGUAL | 0 refills | Status: AC

## 2020-08-18 MED ORDER — AMLODIPINE BESYLATE 2.5 MG PO TABS: 2.5000 mg | ORAL_TABLET | Freq: Every day | ORAL | 0 refills | Status: DC

## 2020-08-18 MED ORDER — EZETIMIBE 10 MG PO TABS: 10.0000 mg | ORAL_TABLET | Freq: Every day | ORAL | 0 refills | Status: DC

## 2020-08-18 MED ORDER — ONDANSETRON HCL 4 MG PO TABS: 4.0000 mg | ORAL_TABLET | Freq: Four times a day (QID) | ORAL | 0 refills | Status: DC | PRN

## 2020-08-18 MED ORDER — METOPROLOL SUCCINATE ER 25 MG PO TB24: ORAL_TABLET | ORAL | 0 refills | Status: DC

## 2020-08-18 MED ORDER — FLUVASTATIN SODIUM 40 MG PO CAPS: 40.0000 mg | ORAL_CAPSULE | Freq: Every day | ORAL | 0 refills | Status: DC

## 2020-08-18 NOTE — Progress Notes (Signed)
Location:  [place] Room Number: 133-P Place of Service:  SNF (31)   CODE STATUS: Full Code  Allergies  Allergen Reactions   Macrodantin    Pravachol [Pravastatin Sodium]    Tetanus Toxoids     Chief Complaint  Patient presents with   Discharge Note    Discharge from [facility] on 08/20/2020     HPI:  She is being discharged to home with home health for pt/ot. She will not need any dme and will need to follow up with her medical provider. She had been hospitalized for increased weakness; acute encephalopathy; with dementia. She was admitted to this facility for short term rehab. She has participated in pt/ot to improve upon her level of independence with her ald. She is now ready to continue her therapy on a home health basis.   Past Medical History:  Diagnosis Date   Allergic rhinitis    Altered mental status    Per records at [facility]    Cataracts, bilateral    Cerebral infarct [facility])    Per records at [facility]    Coronary atherosclerosis of native coronary artery    DES LAD 3/12, 99% nondominant RCA, No CAD otherwise, LVEF 65%   Coronary atherosclerosis of native coronary artery    Per records at [facility]    Diabetic retinopathy ([place])    Disorientation, unspecified    Per records at [facility]    Diverticulitis    Essential hypertension    GERD (gastroesophageal reflux disease)    Hemorrhoids    Hyperlipidemia    Major depressive disorder with single episode    Per records at [facility]    Metabolic encephalopathy    Per records at [facility]    Muscle weakness (generalized)    Per records at [facility]    Stroke, hemorrhagic [facility])    Right thalamic hemorrhage 4/12   Syncope    Neurally mediated   Syncope and collapse    Per records at [facility]    Type 2 diabetes mellitus ([place])    Type 2 diabetes mellitus with  unspecified diabetic retinopathy without macular edema ([place])    Per records at [facility]    Unspecified dementia without behavioral disturbance ([place])    Per records at [facility]     Past Surgical History:  Procedure Laterality Date   BREAST CYST EXCISION     CARDIAC CATHETERIZATION     CAROTID STENT  11/16/10   COLONOSCOPY N/A 10/02/2014   Dr. Rourk:engorged internal hemorrhoids/pancolonic diverticulosis   ESOPHAGOGASTRODUODENOSCOPY  10/02/14   Dr. Rourk:normal esophagus 2 cm hiatal hernia, gastric erosions likely from NSAID effect    Social History   Socioeconomic History   Marital status: Married    Spouse name: Not on file   Number of children: 2   Years of education: 12   Highest education level: Not on file  Occupational History   Occupation: Retired  Tobacco Use   Smoking status: Former Smoker    Packs/day: 0.25    Types: Cigarettes    Quit date: 09/02/1988    Years since quitting: 31.9   Smokeless tobacco: Never Used  Vaping Use   Vaping Use: Never used  Substance and Sexual Activity   Alcohol use: No    Alcohol/week: 0.0 standard drinks   Drug use: No   Sexual activity: Not Currently  Other Topics  Concern   Not on file  Social History Narrative   Married   Right handed   Caffeine use - coffee x 2 cups   Social Determinants of Health   Financial Resource Strain: Not on file  Food Insecurity: Not on file  Transportation Needs: Not on file  Physical Activity: Not on file  Stress: Not on file  Social Connections: Not on file  Intimate Partner Violence: Not on file   Family History  Problem Relation Age of Onset   Pancreatic cancer Mother        Died at age 105   Diabetes Mother    Cancer Mother    Coronary artery disease Father        Died in his 45s   Heart attack Father    Hypertension Father    Breast cancer Sister    Cancer Sister       VITAL SIGNS BP (!) 125/54    Pulse (!) 52    Temp 98 F  (36.7 C)    Ht 5\' 1"  (1.549 m)    Wt 139 lb 9.6 oz (63.3 kg)    SpO2 100%    BMI 26.38 kg/m   Outpatient Encounter Medications as of 08/18/2020  Medication Sig   acetaminophen (TYLENOL) 325 MG tablet Take 2 tablets (650 mg total) by mouth every 4 (four) hours as needed for mild pain, fever or headache (or Fever >/= 101).   ALPRAZolam (XANAX) 0.25 MG tablet Take 0.25 mg by mouth at bedtime as needed for anxiety.   amLODipine (NORVASC) 2.5 MG tablet Take 1 tablet (2.5 mg total) by mouth daily.   aspirin 81 MG tablet Take 1 tablet (81 mg total) by mouth daily with breakfast.   escitalopram (LEXAPRO) 20 MG tablet Take 1.5 tablets (30 mg total) by mouth daily.   ezetimibe (ZETIA) 10 MG tablet Take 1 tablet (10 mg total) by mouth daily.   fluvastatin (LESCOL) 40 MG capsule Take 1 capsule (40 mg total) by mouth daily.   metoprolol succinate (TOPROL-XL) 25 MG 24 hr tablet TAKE (1) TABLET BY MOUTH AT BEDTIME. NEEDS OFFICE VISIT   naphazoline-glycerin (CLEAR EYES REDNESS) 0.012-0.2 % SOLN Place 2 drops into both eyes 4 (four) times daily as needed for eye irritation.   nitroGLYCERIN (NITROSTAT) 0.4 MG SL tablet Dissolve 1 tablet under tongue every 5 mins up to 3 dose in 15 mins for chest pain. If no relief call 911.   ondansetron (ZOFRAN) 4 MG tablet Take 1 tablet (4 mg total) by mouth every 6 (six) hours as needed for nausea.   polyethylene glycol (MIRALAX / GLYCOLAX) 17 g packet Take 17 g by mouth daily.   ramipril (ALTACE) 5 MG capsule Take 1 capsule (5 mg total) by mouth daily.   UNABLE TO FIND Diet: NAS   No facility-administered encounter medications on file as of 08/18/2020.     SIGNIFICANT DIAGNOSTIC EXAMS   PREVIOUS   07-31-20: ct of head:  No evidence of acute intracranial hemorrhage or acute infarction. Mild lateral and third ventriculomegaly is at least partially related to cerebral volume loss. However, there are also some imaging findings which may be seen in the  setting of normal pressure hydrocephalus and clinical correlation is recommended. Moderate cerebral white matter chronic small vessel ischemic disease.  07-31-20: ct of cervical spine:  1. No evidence of acute fracture or traumatic malalignment. 2. Multilevel degenerative changes, as detailed above. Suspected multilevel moderate to severe foraminal stenosis. MRI could better  characterize if clinically indicated.  07-31-20: chest x-ray:  The heart size and mediastinal contours are within normal limits. Both lungs are clear. The visualized skeletal structures are unremarkable  07-31-20: right knee x-rau:  Severe degenerative joint disease. No acute abnormality seen in the right knee  07-31-20: mri of brain:  No acute abnormality Progression of atrophy and chronic microhemorrhage since 2012. Chronic hemorrhage in the thalamus bilaterally. Correlate with hypertension history.  NO NEW EXAMS.   LABS REVIEWED PREVIOUS;   07-31-20: wbc 7.7; hgb 11.8; hct 35.9; mcv 94.7 plt 256; glucose 143; bun 17; creat 1.05; k+ 4.2; na++ 133; ca 9.1 liver normal albumin 3.6 urine culture multiple; hgb a1c 5.7 08-01-20: vit B12: 390; folate 17.0 tsh 3.052 08-02-20: wbc 7.1; hgb 12.6; hct 38.5; mcv 92.8 plt 291; glucose 124; bun 14; creat 0.91; k+ 4.1; na++ 133; ca 9.4 chol 139; ldl 69 trig 99; hdl 50   NO NEW LABS.   Review of Systems  Constitutional: Negative for malaise/fatigue.  Respiratory: Negative for cough and shortness of breath.   Cardiovascular: Negative for chest pain, palpitations and leg swelling.  Gastrointestinal: Negative for abdominal pain, constipation and heartburn.  Musculoskeletal: Negative for back pain, joint pain and myalgias.  Skin: Negative.   Neurological: Negative for dizziness.  Psychiatric/Behavioral: The patient is not nervous/anxious.     Physical Exam Constitutional:      General: She is not in acute distress.    Appearance: She is well-developed and well-nourished.  She is not diaphoretic.  Neck:     Thyroid: No thyromegaly.  Cardiovascular:     Rate and Rhythm: Normal rate and regular rhythm.     Pulses: Normal pulses and intact distal pulses.     Heart sounds: Normal heart sounds.  Pulmonary:     Effort: Pulmonary effort is normal. No respiratory distress.     Breath sounds: Normal breath sounds.  Abdominal:     General: Bowel sounds are normal. There is no distension.     Palpations: Abdomen is soft.     Tenderness: There is no abdominal tenderness.  Musculoskeletal:        General: Normal range of motion.     Cervical back: Neck supple.     Right lower leg: No edema.     Left lower leg: No edema.  Lymphadenopathy:     Cervical: No cervical adenopathy.  Skin:    General: Skin is warm and dry.  Neurological:     Mental Status: She is alert. Mental status is at baseline.  Psychiatric:        Mood and Affect: Mood and affect and mood normal.      ASSESSMENT/ PLAN:   Patient is being discharged with the following home health services:  Pt/ot to evaluate and treat as indicated for gait balance strength adl training   Patient is being discharged with the following durable medical equipment: none needed    Patient has been advised to f/u with their PCP in 1-2 weeks to bring them up to date on their rehab stay.  Social services at facility was responsible for arranging this appointment.  Pt was provided with a 30 day supply of prescriptions for medications and refills must be obtained from their PCP.  For controlled substances, a more limited supply may be provided adequate until PCP appointment only.  A 30 day supply of her prescription medication have been sent to [facility]   Time spent with patient 35 minutes: dme; medications; home  health.   Ok Edwards NP [facility]  Contact [phone] Monday through Friday 8am- 5pm  After hours call [phone]

## 2020-08-23 ENCOUNTER — Encounter: Payer: Self-pay | Admitting: Adult Health

## 2020-08-23 ENCOUNTER — Other Ambulatory Visit: Payer: Self-pay | Admitting: Adult Health

## 2020-08-23 DIAGNOSIS — F32A Depression, unspecified: Secondary | ICD-10-CM

## 2020-08-23 MED ORDER — AMLODIPINE BESYLATE 2.5 MG PO TABS: 2.5000 mg | ORAL_TABLET | Freq: Every day | ORAL | 0 refills | Status: DC

## 2020-08-23 MED ORDER — RAMIPRIL 5 MG PO CAPS: 5.0000 mg | ORAL_CAPSULE | Freq: Every day | ORAL | 0 refills | Status: DC

## 2020-08-23 MED ORDER — ONDANSETRON 4 MG PO TBDP: 4.0000 mg | ORAL_TABLET | Freq: Four times a day (QID) | ORAL | 0 refills | Status: DC | PRN

## 2020-08-23 MED ORDER — ALPRAZOLAM 0.25 MG PO TABS: 0.2500 mg | ORAL_TABLET | Freq: Every evening | ORAL | 0 refills | Status: AC | PRN

## 2020-08-23 MED ORDER — ESCITALOPRAM OXALATE 20 MG PO TABS: 30.0000 mg | ORAL_TABLET | Freq: Every day | ORAL | 0 refills | Status: DC

## 2020-08-23 MED ORDER — FLUVASTATIN SODIUM 40 MG PO CAPS: 40.0000 mg | ORAL_CAPSULE | Freq: Every day | ORAL | 0 refills | Status: DC

## 2020-08-23 MED ORDER — EZETIMIBE 10 MG PO TABS: 10.0000 mg | ORAL_TABLET | Freq: Every day | ORAL | 0 refills | Status: DC

## 2020-08-23 MED ORDER — METOPROLOL SUCCINATE ER 25 MG PO TB24: ORAL_TABLET | ORAL | 0 refills | Status: DC

## 2020-08-23 NOTE — Progress Notes (Signed)
Location:    [place] Room Number: 133-P Place of Service:  SNF (31)    CODE STATUS: Full Code  Allergies  Allergen Reactions  . Macrodantin   . Pravachol [Pravastatin Sodium]   . Tetanus Toxoids     Chief Complaint  Patient presents with  . Discharge Note    Discharge from [facility] on 08/24/2020     HPI:  Past Medical History:  Diagnosis Date  . Allergic rhinitis   . Altered mental status    Per records at [facility]   . Cataracts, bilateral   . Cerebral infarct [facility])    Per records at [facility]   . Coronary atherosclerosis of native coronary artery    DES LAD 3/12, 99% nondominant RCA, No CAD otherwise, LVEF 65%  . Coronary atherosclerosis of native coronary artery    Per records at [facility]   . Diabetic retinopathy ([place])   . Disorientation, unspecified    Per records at [facility]   . Diverticulitis   . Essential hypertension   . GERD (gastroesophageal reflux disease)   . Hemorrhoids   . Hyperlipidemia   . Major depressive disorder with single episode    Per records at [facility]   . Metabolic encephalopathy    Per records at [facility]   . Muscle weakness (generalized)    Per records at [facility]   . Stroke, hemorrhagic ([place])    Right thalamic hemorrhage 4/12  . Syncope    Neurally mediated  . Syncope and collapse    Per records at [facility]   . Type 2 diabetes mellitus ([place])   . Type 2 diabetes mellitus with unspecified diabetic retinopathy without macular edema ([place])    Per records at [facility]   . Unspecified dementia without behavioral disturbance ([place])    Per records at [facility]     Past Surgical History:  Procedure Laterality Date  . BREAST CYST EXCISION    . CARDIAC CATHETERIZATION    . CAROTID STENT  11/16/10  . COLONOSCOPY N/A 10/02/2014   Dr. Rourk:engorged internal hemorrhoids/pancolonic diverticulosis   . ESOPHAGOGASTRODUODENOSCOPY  10/02/14   Dr. Rourk:normal esophagus 2 cm hiatal hernia, gastric erosions likely from NSAID effect    Social History   Socioeconomic History  . Marital status: Married    Spouse name: Not on file  . Number of children: 2  . Years of education: 16  . Highest education level: Not on file  Occupational History  . Occupation: Retired  Tobacco Use  . Smoking status: Former Smoker    Packs/day: 0.25    Types: Cigarettes    Quit date: 09/02/1988    Years since quitting: 31.9  . Smokeless tobacco: Never Used  Vaping Use  . Vaping Use: Never used  Substance and Sexual Activity  . Alcohol use: No    Alcohol/week: 0.0 standard drinks  . Drug use: No  . Sexual activity: Not Currently  Other Topics Concern  . Not on file  Social History Narrative   Married   Right handed   Caffeine use - coffee x 2 cups   Social Determinants of Health   Financial Resource Strain: Not on file  Food Insecurity: Not on file  Transportation Needs: Not on file  Physical Activity: Not on file  Stress: Not on file  Social Connections: Not on file  Intimate Partner Violence: Not on  file   Family History  Problem Relation Age of Onset  . Pancreatic cancer Mother        Died at age 43  . Diabetes Mother   . Cancer Mother   . Coronary artery disease Father        Died in his 23s  . Heart attack Father   . Hypertension Father   . Breast cancer Sister   . Cancer Sister     VITAL SIGNS BP (!) 136/53   Pulse (!) 52   Temp 97.9 F (36.6 C)   Resp 20   Ht 5\' 1"  (1.549 m)   Wt 139 lb 9.6 oz (63.3 kg)   SpO2 100%   BMI 26.38 kg/m   Patient's Medications  New Prescriptions   No medications on file  Previous Medications   ACETAMINOPHEN (TYLENOL) 325 MG TABLET    Take 2 tablets (650 mg total) by mouth every 4 (four) hours as needed for mild pain, fever or headache (or Fever >/= 101).   ALPRAZOLAM (XANAX) 0.25 MG TABLET    Take 1 tablet (0.25 mg total) by mouth at  bedtime as needed for up to 14 days for anxiety.   AMLODIPINE (NORVASC) 2.5 MG TABLET    Take 1 tablet (2.5 mg total) by mouth daily.   ASPIRIN 81 MG TABLET    Take 1 tablet (81 mg total) by mouth daily with breakfast.   ESCITALOPRAM (LEXAPRO) 20 MG TABLET    Take 1.5 tablets (30 mg total) by mouth daily.   EZETIMIBE (ZETIA) 10 MG TABLET    Take 1 tablet (10 mg total) by mouth daily.   FLUVASTATIN (LESCOL) 40 MG CAPSULE    Take 1 capsule (40 mg total) by mouth daily.   METOPROLOL SUCCINATE (TOPROL-XL) 25 MG 24 HR TABLET    TAKE (1) TABLET BY MOUTH AT BEDTIME. NEEDS OFFICE VISIT   NAPHAZOLINE-GLYCERIN (CLEAR EYES REDNESS) 0.012-0.2 % SOLN    Place 2 drops into both eyes 4 (four) times daily as needed for eye irritation.   NITROGLYCERIN (NITROSTAT) 0.4 MG SL TABLET    Dissolve 1 tablet under tongue every 5 mins up to 3 dose in 15 mins for chest pain. If no relief call 911.   ONDANSETRON (ZOFRAN-ODT) 4 MG DISINTEGRATING TABLET    Take 4 mg by mouth every 6 (six) hours as needed for nausea or vomiting.   POLYETHYLENE GLYCOL (MIRALAX / GLYCOLAX) 17 G PACKET    Take 17 g by mouth daily.   RAMIPRIL (ALTACE) 5 MG CAPSULE    Take 1 capsule (5 mg total) by mouth daily.   UNABLE TO FIND    Diet: NAS  Modified Medications   No medications on file  Discontinued Medications   ONDANSETRON (ZOFRAN) 4 MG TABLET    Take 1 tablet (4 mg total) by mouth every 6 (six) hours as needed for nausea.     SIGNIFICANT DIAGNOSTIC EXAMS       ASSESSMENT/ PLAN:      Patient is being discharged with the following home health services:    Patient is being discharged with the following durable medical equipment:    Patient has been advised to f/u with their PCP in 1-2 weeks to bring them up to date on their rehab stay.  Social services at facility was responsible for arranging this appointment.  Pt was provided with a 30 day supply of prescriptions for medications and refills must be obtained from their PCP.  For  controlled substances, a more limited supply may be provided adequate until PCP appointment only.   Synthia Innocent NP [facility]  Contact [phone] Monday through Friday 8am- 5pm  After hours call [phone]

## 2020-08-23 NOTE — Progress Notes (Signed)
This encounter was created in error - please disregard.  This encounter was created in error - please disregard.

## 2020-08-28 ENCOUNTER — Encounter: Payer: Self-pay | Admitting: Neurology

## 2020-08-28 ENCOUNTER — Inpatient Hospital Stay: Payer: PPO | Admitting: Family Medicine

## 2020-08-28 ENCOUNTER — Ambulatory Visit (INDEPENDENT_AMBULATORY_CARE_PROVIDER_SITE_OTHER): Payer: PPO | Admitting: Neurology

## 2020-08-28 VITALS — BP 124/60 | HR 54 | Ht 62.0 in | Wt 142.0 lb

## 2020-08-28 DIAGNOSIS — W19XXXS Unspecified fall, sequela: Secondary | ICD-10-CM

## 2020-08-28 DIAGNOSIS — R413 Other amnesia: Secondary | ICD-10-CM

## 2020-08-28 MED ORDER — MELATONIN 10 MG PO TABS: 10.0000 mg | ORAL_TABLET | Freq: Every day | ORAL | 1 refills | Status: AC

## 2020-08-28 MED ORDER — DONEPEZIL HCL 10 MG PO TABS: 10.0000 mg | ORAL_TABLET | Freq: Every day | ORAL | 3 refills | Status: DC

## 2020-08-28 NOTE — Patient Instructions (Signed)
I had a long discussion the patient, husband and daughter regarding her complaints of memory loss and frequent falls which are likely multifactorial due to combination of age-related mild cognitive impairment, previous stroke and medication effect.  I recommend she taper Xanax to half a tablet every day for a week and then half a tablet every other day for another week and stop.  She can start taking melatonin 10 mg tablets at sunset as well as Tylenol PM at bedtime to help her sleep.  I encouraged her to keep her appointment for outpatient physical occupational therapy and gait training.  Trial of Aricept 10 mg half a tablet at bedtime for a month to be followed by 1 tablet if tolerated.  I discussed possible side effects with the patient and family advised him to call me if needed.  She will stay on aspirin for stroke prevention and maintain aggressive risk factor modification with strict control of hypertension with blood pressure goal below 130/90, lipids with LDL cholesterol goal below 70 mg percent.  She was encouraged to use a cane at all times and we discussed fall prevention precautions.  She will return for follow-up in the future in 3 months or call earlier if necessary.  Fall Prevention in the Home, Adult Falls can cause injuries. They can happen to people of all ages. There are many things you can do to make your home safe and to help prevent falls. Ask for help when making these changes, if needed. What actions can I take to prevent falls? General Instructions  Use good lighting in all rooms. Replace any light bulbs that burn out.  Turn on the lights when you go into a dark area. Use night-lights.  Keep items that you use often in easy-to-reach places. Lower the shelves around your home if necessary.  Set up your furniture so you have a clear path. Avoid moving your furniture around.  Do not have throw rugs and other things on the floor that can make you trip.  Avoid walking on wet  floors.  If any of your floors are uneven, fix them.  Add color or contrast paint or tape to clearly mark and help you see: ? Any grab bars or handrails. ? First and last steps of stairways. ? Where the edge of each step is.  If you use a stepladder: ? Make sure that it is fully opened. Do not climb a closed stepladder. ? Make sure that both sides of the stepladder are locked into place. ? Ask someone to hold the stepladder for you while you use it.  If there are any pets around you, be aware of where they are. What can I do in the bathroom?      Keep the floor dry. Clean up any water that spills onto the floor as soon as it happens.  Remove soap buildup in the tub or shower regularly.  Use non-skid mats or decals on the floor of the tub or shower.  Attach bath mats securely with double-sided, non-slip rug tape.  If you need to sit down in the shower, use a plastic, non-slip stool.  Install grab bars by the toilet and in the tub and shower. Do not use towel bars as grab bars. What can I do in the bedroom?  Make sure that you have a light by your bed that is easy to reach.  Do not use any sheets or blankets that are too big for your bed. They should not hang  down onto the floor.  Have a firm chair that has side arms. You can use this for support while you get dressed. What can I do in the kitchen?  Clean up any spills right away.  If you need to reach something above you, use a strong step stool that has a grab bar.  Keep electrical cords out of the way.  Do not use floor polish or wax that makes floors slippery. If you must use wax, use non-skid floor wax. What can I do with my stairs?  Do not leave any items on the stairs.  Make sure that you have a light switch at the top of the stairs and the bottom of the stairs. If you do not have them, ask someone to add them for you.  Make sure that there are handrails on both sides of the stairs, and use them. Fix  handrails that are broken or loose. Make sure that handrails are as long as the stairways.  Install non-slip stair treads on all stairs in your home.  Avoid having throw rugs at the top or bottom of the stairs. If you do have throw rugs, attach them to the floor with carpet tape.  Choose a carpet that does not hide the edge of the steps on the stairway.  Check any carpeting to make sure that it is firmly attached to the stairs. Fix any carpet that is loose or worn. What can I do on the outside of my home?  Use bright outdoor lighting.  Regularly fix the edges of walkways and driveways and fix any cracks.  Remove anything that might make you trip as you walk through a door, such as a raised step or threshold.  Trim any bushes or trees on the path to your home.  Regularly check to see if handrails are loose or broken. Make sure that both sides of any steps have handrails.  Install guardrails along the edges of any raised decks and porches.  Clear walking paths of anything that might make someone trip, such as tools or rocks.  Have any leaves, snow, or ice cleared regularly.  Use sand or salt on walking paths during winter.  Clean up any spills in your garage right away. This includes grease or oil spills. What other actions can I take?  Wear shoes that: ? Have a low heel. Do not wear high heels. ? Have rubber bottoms. ? Are comfortable and fit you well. ? Are closed at the toe. Do not wear open-toe sandals.  Use tools that help you move around (mobility aids) if they are needed. These include: ? Canes. ? Walkers. ? Scooters. ? Crutches.  Review your medicines with your doctor. Some medicines can make you feel dizzy. This can increase your chance of falling. Ask your doctor what other things you can do to help prevent falls. Where to find more information  Centers for Disease Control and Prevention, [place]: HealthcareCounselor.com.pt  [employer] on Aging:  RingConnections.si Contact a doctor if:  You are afraid of falling at home.  You feel weak, drowsy, or dizzy at home.  You fall at home. Summary  There are many simple things that you can do to make your home safe and to help prevent falls.  Ways to make your home safe include removing tripping hazards and installing grab bars in the bathroom.  Ask for help when making these changes in your home. This information is not intended to replace advice given to you by  your health care provider. Make sure you discuss any questions you have with your health care provider. Document Revised: 12/10/2018 Document Reviewed: 04/03/2017 Elsevier Patient Education  2020 [employer].

## 2020-08-28 NOTE — Progress Notes (Signed)
[facility] [address] [place]. [place] [address] [phone]       OFFICE CONSULT NOTE  [name] Date of Birth:  10-28-1933 Medical Record Number:  [id number]   Referring MD:  Malena taylor  Reason for Referral: History of stroke  HPI: [name] is a 86 year old pleasant Caucasian lady with past medical history of right thalamic hemorrhage in Apr 2012, syncope, gastroesophageal reflux disease, hypertension, diverticulitis, diabetes, gait difficulties and mild cognitive impairment.  She is seen today for consultation visit for increasing falls and memory impairment.  History is obtained from the patient and husband as well as daughter who accompany her today.  I have also personally reviewed electronic medical records and prior relevant imaging films in PACS.  Patient has had some balance difficulties ongoing since 2012 following her right thalamic hemorrhage but recently has been falling a lot.  She is unable to pinpoint exact reason for the falls and they occur at variable intervals and circumstances.  She is also noticed some worsening of her cognitive impairment and she gets confused and disoriented at times.  She does not remember to use a cane or a walker which she both has but does not not use consistently.  She states she just forgets to use it.  She was recently admitted to [facility] on 08/01/2020 and found to have some confusion and gait imbalance which was thought to be due to metabolic issues.  She was thought to have low-grade UTI but urine cultures were negative.  MRI scan had shown moderate ventriculomegaly as well as cerebral atrophy raising question of NPH but Dr. Merlene Laughter who saw the patient did not feel patient had clinical history to suggest NPH.  Patient underwent extensive work-up including vitamin B12, TSH which were negative and made gradual improvement.  She has been referred to physical therapy but she has not yet started  outpatient therapy but plans to do so soon.  On Mini-Mental status exam testing today she scored 29/30 with only minor deficits.  Patient has not been on Aricept or Namenda like medications but she was seen by me following the previous hemorrhage and mild cognitive impairment was diagnosed even as far back as in 2012.  There is no family Struve dementia.  She denies any recent headaches, seizures, loss of consciousness or new strokelike symptoms.  The patient has Xanax listed as a as needed medication but states that she has been taking it every night to help her sleep.  ROS:   14 system review of systems is positive for memory loss, imbalance, falls, confusion, disorientation, staring episodes, facial twitching and all other systems negative  PMH:  Past Medical History:  Diagnosis Date  . Allergic rhinitis   . Altered mental status    Per records at [facility]   . Cataracts, bilateral   . Cerebral infarct [facility])    Per records at [facility]   . Coronary atherosclerosis of native coronary artery    DES LAD 3/12, 99% nondominant RCA, No CAD otherwise, LVEF 65%  . Coronary atherosclerosis of native coronary artery    Per records at [facility]   . Diabetic retinopathy ([place])   . Disorientation, unspecified    Per records at [facility]   . Diverticulitis   . Essential hypertension   . GERD (gastroesophageal reflux disease)   . Hemorrhoids   . Hyperlipidemia   . Major depressive disorder with single episode  Per records at [facility]   . Metabolic encephalopathy    Per records at [facility]   . Muscle weakness (generalized)    Per records at [facility]   . Stroke, hemorrhagic ([place])    Right thalamic hemorrhage 4/12  . Syncope    Neurally mediated  . Syncope and collapse    Per records at [facility]   . Type 2 diabetes mellitus ([place])   . Type 2 diabetes mellitus with unspecified diabetic retinopathy without  macular edema ([place])    Per records at [facility]   . Unspecified dementia without behavioral disturbance ([place])    Per records at [facility]     Social History:  Social History   Socioeconomic History  . Marital status: Married    Spouse name: [name] Resides  . Number of children: 2  . Years of education: 59  . Highest education level: Not on file  Occupational History  . Occupation: Retired  Tobacco Use  . Smoking status: Former Smoker    Packs/day: 0.25    Types: Cigarettes    Quit date: 09/02/1988    Years since quitting: 32.0  . Smokeless tobacco: Never Used  Vaping Use  . Vaping Use: Never used  Substance and Sexual Activity  . Alcohol use: No    Alcohol/week: 0.0 standard drinks  . Drug use: No  . Sexual activity: Not Currently  Other Topics Concern  . Not on file  Social History Narrative   Lives with husband   Right handed   Caffeine use - coffee x 2 cups   Social Determinants of Health   Financial Resource Strain: Not on file  Food Insecurity: Not on file  Transportation Needs: Not on file  Physical Activity: Not on file  Stress: Not on file  Social Connections: Not on file  Intimate Partner Violence: Not on file    Medications:   Current Outpatient Medications on File Prior to Visit  Medication Sig Dispense Refill  . acetaminophen (TYLENOL) 325 MG tablet Take 2 tablets (650 mg total) by mouth every 4 (four) hours as needed for mild pain, fever or headache (or Fever >/= 101). 12 tablet 1  . ALPRAZolam (XANAX) 0.25 MG tablet Take 1 tablet (0.25 mg total) by mouth at bedtime as needed for up to 14 days for anxiety. 10 tablet 0  . amLODipine (NORVASC) 2.5 MG tablet Take 1 tablet (2.5 mg total) by mouth daily. 30 tablet 0  . aspirin 81 MG tablet Take 1 tablet (81 mg total) by mouth daily with breakfast. 30 tablet 5  . escitalopram (LEXAPRO) 20 MG tablet Take 1.5 tablets (30 mg total) by mouth daily. 45 tablet 0  . ezetimibe (ZETIA) 10 MG tablet Take  1 tablet (10 mg total) by mouth daily. 30 tablet 0  . fluvastatin (LESCOL) 40 MG capsule Take 1 capsule (40 mg total) by mouth daily. 30 capsule 0  . metoprolol succinate (TOPROL-XL) 25 MG 24 hr tablet TAKE (1) TABLET BY MOUTH AT BEDTIME. NEEDS OFFICE VISIT 30 tablet 0  . naphazoline-glycerin (CLEAR EYES REDNESS) 0.012-0.2 % SOLN Place 2 drops into both eyes 4 (four) times daily as needed for eye irritation. 15 mL 1  . nitroGLYCERIN (NITROSTAT) 0.4 MG SL tablet Dissolve 1 tablet under tongue every 5 mins up to 3 dose in 15 mins for chest pain. If no relief call 911. 25 tablet 0  . ondansetron (ZOFRAN-ODT) 4 MG disintegrating tablet Take  1 tablet (4 mg total) by mouth every 6 (six) hours as needed for nausea or vomiting. 10 tablet 0  . polyethylene glycol (MIRALAX / GLYCOLAX) 17 g packet Take 17 g by mouth daily. 30 each 2  . ramipril (ALTACE) 5 MG capsule Take 1 capsule (5 mg total) by mouth daily. 30 capsule 0  . UNABLE TO FIND Diet: NAS     No current facility-administered medications on file prior to visit.    Allergies:   Allergies  Allergen Reactions  . Macrodantin   . Pravachol [Pravastatin Sodium]   . Tetanus Toxoids     Physical Exam General: Frail elderly Caucasian lady, seated, in no evident distress Head: head normocephalic and atraumatic.   Neck: supple with no carotid or supraclavicular bruits Cardiovascular: regular rate and rhythm, no murmurs Musculoskeletal: no deformity Skin:  no rash/petichiae Vascular:  Normal pulses all extremities  Neurologic Exam Mental Status: Awake and fully alert. Oriented to place and time. Recent and remote memory intact. Attention span, concentration and fund of knowledge appropriate. Mood and affect appropriate.  Mini-Mental status exam 29/30 with one deficit in recall.  Able to name 6 animals that can walk on 4 legs.  Clock drawing score 3/4.  Geriatric depression scale scored 7 suggestive of mild depression. Cranial Nerves: Fundoscopic  exam reveals sharp disc margins. Pupils equal, briskly reactive to light. Extraocular movements full without nystagmus. Visual fields full to confrontation. Hearing mildly diminished bilaterally. Facial sensation intact. Face, tongue, palate moves normally and symmetrically.  Motor: Normal bulk and tone. Normal strength in all tested extremity muscles except diminished fine finger movements on the left and orbits right over left upper extremity.  Mild weakness of left grip.. Sensory.: intact to touch , pinprick , position and vibratory sensation.  Coordination: Rapid alternating movements normal in all extremities. Finger-to-nose and heel-to-shin performed accurately bilaterally. Gait and Station: Arises from chair without difficulty. Stance is slightly stooped gait demonstrates normal stride length and mild iimbalance .  Unable to do tandem walking. Reflexes: 1+ and symmetric. Toes downgoing.   NIHSS  0 Modified Rankin  1   ASSESSMENT: 86 year old Caucasian lady with increasing recent falls likely multifactorial due to combination of mild cognitive impairment and medication effect and prior stroke.  Recent cognitive worsening due to mild cognitive impairment.  Remote history of right thalamic hemorrhage in Apr 2012 of hypertensive etiology     PLAN: I had a long discussion the patient, husband and daughter regarding her complaints of memory loss and frequent falls which are likely multifactorial due to combination of age-related mild cognitive impairment, previous stroke and medication effect.  I recommend she taper Xanax to half a tablet every day for a week and then half a tablet every other day for another week and stop.  She can start taking melatonin 10 mg tablets at sunset as well as Tylenol PM at bedtime to help her sleep.  I encouraged her to keep her appointment for outpatient physical occupational therapy and gait training.  Trial of Aricept 10 mg half a tablet at bedtime for a month to  be followed by 1 tablet if tolerated.  I discussed possible side effects with the patient and family advised him to call me if needed.  She will stay on aspirin for stroke prevention and maintain aggressive risk factor modification with strict control of hypertension with blood pressure goal below 130/90, lipids with LDL cholesterol goal below 70 mg percent.  She was encouraged to use a cane  at all times and we discussed fall prevention precautions.  She will return for follow-up in the future in 3 months or call earlier if necessary.  Greater than 50% time during this 45-minute consultation visit was spent on counseling and coordination of care about her falls, imbalance, cognitive impairment and answering questions Antony Contras, MD Note: This document was prepared with digital dictation and possible smart phrase technology. Any transcriptional errors that result from this process are unintentional.

## 2020-08-30 ENCOUNTER — Ambulatory Visit (INDEPENDENT_AMBULATORY_CARE_PROVIDER_SITE_OTHER): Payer: PPO | Admitting: Neurology

## 2020-08-30 DIAGNOSIS — R410 Disorientation, unspecified: Secondary | ICD-10-CM

## 2020-08-30 DIAGNOSIS — R413 Other amnesia: Secondary | ICD-10-CM

## 2020-08-31 ENCOUNTER — Telehealth: Payer: Self-pay | Admitting: Emergency Medicine

## 2020-08-31 NOTE — Telephone Encounter (Signed)
Patient called and informed of Dr. Marlis Edelson findings regarding EEG.  Patient expressed appreciation.

## 2020-08-31 NOTE — Progress Notes (Signed)
Kindly inform the patient that EEG study was normal

## 2020-08-31 NOTE — Telephone Encounter (Signed)
-----   Message from Micki Riley, MD sent at 08/31/2020 10:32 AM EST ----- Kindly inform the patient that EEG study was normal

## 2020-09-05 ENCOUNTER — Encounter (HOSPITAL_COMMUNITY): Payer: Self-pay | Admitting: Physical Therapy

## 2020-09-05 ENCOUNTER — Ambulatory Visit (HOSPITAL_COMMUNITY): Payer: PPO | Attending: Family Medicine | Admitting: Physical Therapy

## 2020-09-05 ENCOUNTER — Other Ambulatory Visit: Payer: Self-pay

## 2020-09-05 DIAGNOSIS — M6281 Muscle weakness (generalized): Secondary | ICD-10-CM | POA: Diagnosis not present

## 2020-09-05 DIAGNOSIS — R2689 Other abnormalities of gait and mobility: Secondary | ICD-10-CM | POA: Diagnosis not present

## 2020-09-05 DIAGNOSIS — R29898 Other symptoms and signs involving the musculoskeletal system: Secondary | ICD-10-CM | POA: Insufficient documentation

## 2020-09-05 NOTE — Therapy (Addendum)
[facility] [address] [place], [place], [address] Phone: [phone]   Fax:  [phone]  Physical Therapy Evaluation  Patient Details  Name: RHINA KRAMME MRN: [id number] Date of Birth: June 09, 1934 Referring Provider (PT): Malena Ladona Ridgel DO   Encounter Date: 09/05/2020   PT End of Session - 09/05/20 1455    Visit Number 1    Number of Visits 8    Date for PT Re-Evaluation 10/03/20    Authorization Type Primary: BCBS Secondary Health team advantage (no visit limit, no auth, follows medicare guidlines)    PT Start Time 1410    PT Stop Time 1447    PT Time Calculation (min) 37 min    Equipment Utilized During Treatment Gait belt    Activity Tolerance Patient tolerated treatment well    Behavior During Therapy WFL for tasks assessed/performed           Past Medical History:  Diagnosis Date  . Allergic rhinitis   . Altered mental status    Per records at [facility]   . Cataracts, bilateral   . Cerebral infarct [facility])    Per records at [facility]   . Coronary atherosclerosis of native coronary artery    DES LAD 3/12, 99% nondominant RCA, No CAD otherwise, LVEF 65%  . Coronary atherosclerosis of native coronary artery    Per records at [facility]   . Diabetic retinopathy (HCC)   . Disorientation, unspecified    Per records at [facility]   . Diverticulitis   . Essential hypertension   . GERD (gastroesophageal reflux disease)   . Hemorrhoids   . Hyperlipidemia   . Major depressive disorder with single episode    Per records at [facility]   . Metabolic encephalopathy    Per records at [facility]   . Muscle weakness (generalized)    Per records at [facility]   . Stroke, hemorrhagic (HCC)    Right thalamic hemorrhage 4/12  . Syncope    Neurally mediated  . Syncope and collapse    Per records at [facility]   . Type 2 diabetes mellitus (HCC)   .  Type 2 diabetes mellitus with unspecified diabetic retinopathy without macular edema (HCC)    Per records at [facility]   . Unspecified dementia without behavioral disturbance (HCC)    Per records at [facility]     Past Surgical History:  Procedure Laterality Date  . BREAST CYST EXCISION    . CARDIAC CATHETERIZATION    . CAROTID STENT  11/16/10  . COLONOSCOPY N/A 10/02/2014   Dr. Rourk:engorged internal hemorrhoids/pancolonic diverticulosis  . ESOPHAGOGASTRODUODENOSCOPY  10/02/14   Dr. Rourk:normal esophagus 2 cm hiatal hernia, gastric erosions likely from NSAID effect    There were no vitals filed for this visit.    Subjective Assessment - 09/05/20 1410    Subjective Patient is a 87 y.o. female who presents to physical therapy with c/o falls. Patient states she had a stroke years ago. She states she has trouble remembering things. She doesn't walk to good so she uses a cane. Her family brings her food and takes her places. She has trouble walking and has had falls. She falls when she gets out of bed sometimes. She wants to work on her walking, balance and leg strength.    Limitations Walking;House hold activities;Standing    How long can you walk comfortably? 1  minute    Patient Stated Goals balance, strength, and walking    Currently in Pain? No/denies              [facility] PT Assessment - 09/05/20 0001      Assessment   Medical Diagnosis Cerebral infarction, syncope and collapse; Weakness, gait, balance    Referring Provider (PT) Malena Lovena Le DO    Onset Date/Surgical Date 08/02/20    Next MD Visit unsure    Prior Therapy hospital      Precautions   Precautions Fall      Restrictions   Weight Bearing Restrictions No      Balance Screen   Has the patient fallen in the past 6 months Yes    How many times? 2    Has the patient had a decrease in activity level because of a fear of falling?  No    Is the patient reluctant to leave their home because of a  fear of falling?  No      Prior Function   Level of Independence Independent with basic ADLs    Vocation Retired      Associate Professor   Overall Cognitive Status History of cognitive impairments - at baseline   forgetful     Observation/Other Assessments   Observations Ambulates with SPC    Focus on Therapeutic Outcomes (FOTO)  n/a      ROM / Strength   AROM / PROM / Strength AROM;Strength      AROM   Overall AROM  Within functional limits for tasks performed      Strength   Strength Assessment Site Hip;Knee;Ankle    Right/Left Hip Right;Left    Right Hip Flexion 4-/5    Left Hip Flexion 4/5    Right/Left Knee Right;Left    Right Knee Flexion 5/5    Right Knee Extension 5/5    Left Knee Flexion 5/5    Left Knee Extension 5/5    Right/Left Ankle Right;Left    Right Ankle Dorsiflexion 5/5    Left Ankle Dorsiflexion 5/5      Transfers   Five time sit to stand comments  18.86 seconds    Comments slow, without use of hands      Ambulation/Gait   Ambulation/Gait Yes    Ambulation/Gait Assistance 4: Min guard    Ambulation Distance (Feet) 195 Feet    Assistive device Straight cane    Gait Pattern Poor foot clearance - left;Poor foot clearance - right;Trunk flexed    Ambulation Surface Level;Indoor    Gait velocity decreased    Gait Comments 2MWT, slow, labored, minimal use of SPC, unsteady      Standardized Balance Assessment   Standardized Balance Assessment Dynamic Gait Index      Dynamic Gait Index   Level Surface Moderate Impairment    Change in Gait Speed Mild Impairment    Gait with Horizontal Head Turns Moderate Impairment    Gait with Vertical Head Turns Moderate Impairment    Gait and Pivot Turn Moderate Impairment    Step Over Obstacle Moderate Impairment    Step Around Obstacles Moderate Impairment    Steps Moderate Impairment    Total Score 9    DGI comment: High risk for falling                      Objective measurements completed on  examination: See above findings.  PT Education - 09/05/20 1410    Education Details Patient educated on exam findings, POC, scope of PT    Person(s) Educated Patient;Child(ren)    Methods Demonstration;Explanation    Comprehension Verbalized understanding;Returned demonstration            PT Short Term Goals - 09/05/20 1506      PT SHORT TERM GOAL #1   Title Patient will be independent with HEP in order to improve functional outcomes.    Time 2    Period Weeks    Status New    Target Date 09/19/20      PT SHORT TERM GOAL #2   Title Patient will report at least 25% improvement in symptoms for improved quality of life.    Time 2    Period Weeks    Status New    Target Date 09/19/20             PT Long Term Goals - 09/05/20 1512      PT LONG TERM GOAL #1   Title Patient will report at least 75% improvement in symptoms for improved quality of life.    Time 4    Period Weeks    Status New    Target Date 10/03/20      PT LONG TERM GOAL #2   Title Patient will be able to complete 5x STS in under 14 seconds in order to reduce the risk of falls.    Time 4    Period Weeks    Status New    Target Date 10/03/20      PT LONG TERM GOAL #3   Title Patient will score at least 15 points on the DGI in order to demonstrate improved balance to reduce the risk of falls.    Time 4    Period Weeks    Status New    Target Date 10/03/20      PT LONG TERM GOAL #4   Title Patient will be able to ambulate at least 226 feet in 2MWT in order to demonstrate improved gait speed for community ambulation.    Time 4    Period Weeks    Status New    Target Date 10/03/20                  Plan - 09/05/20 1518    Clinical Impression Statement Patient is a 87 y.o. female who presents to physical therapy with c/o falls, weakness, and balance deficits. She presents with deficits in LE strength, gait balance and functional mobility with ADL. She is having to  modify and restrict ADL as indicated by DGI, 5xSTS as well as subjective information and objective measures which is affecting overall participation. Patient will benefit from skilled physical therapy in order to improve function and reduce impairment.    Personal Factors and Comorbidities Age;Fitness;Behavior Pattern;Past/Current Experience;Comorbidity 2;Social Background;Time since onset of injury/illness/exacerbation    Comorbidities impaired memory, CVA    Examination-Activity Limitations Locomotion Level;Transfers;Stairs;Squat;Stand;Lift;Caring for Others    Examination-Participation Restrictions Cleaning;Yard Work;Shop;Community Activity    Stability/Clinical Decision Making Stable/Uncomplicated    Clinical Decision Making Low    Rehab Potential Fair    PT Frequency 2x / week    PT Duration 4 weeks    PT Treatment/Interventions ADLs/Self Care Home Management;Aquatic Therapy;Biofeedback;Canalith Repostioning;Cryotherapy;Scientist, product/process development;Iontophoresis 4mg /ml Dexamethasone;Traction;Ultrasound;Moist Heat;Gait training;Stair training;Functional mobility training;Therapeutic activities;Therapeutic exercise;Balance training;Neuromuscular re-education;Patient/family education;Orthotic Fit/Training;Manual techniques;Compression bandaging;Passive range of motion;Scar mobilization;Dry needling;Energy conservation;Splinting;Taping;Spinal Manipulations;Joint Manipulations    PT Next Visit  Plan begin functional strengthening, begin static and dynamic balance training, gait training with K[place] initiate next session    Consulted and Agree with Plan of Care Patient           Patient will benefit from skilled therapeutic intervention in order to improve the following deficits and impairments:  Abnormal gait,Difficulty walking,Decreased endurance,Decreased activity tolerance,Decreased knowledge of use of DME,Decreased balance,Improper body mechanics,Decreased  strength,Decreased mobility  Visit Diagnosis: Other abnormalities of gait and mobility - Plan: PT plan of care cert/re-cert  Muscle weakness (generalized) - Plan: PT plan of care cert/re-cert  Other symptoms and signs involving the musculoskeletal system - Plan: PT plan of care cert/re-cert     Problem List Patient Active Problem List   Diagnosis Date Noted  . Chronic constipation 08/04/2020  . Major depression, recurrent, chronic ([place]) 08/04/2020  . Hyperlipidemia associated with type 2 diabetes mellitus ([place]) 08/04/2020  . Hyponatremia 08/04/2020  . Moderately advanced dementia without behavioral disturbance ([place]) 08/02/2020  . Acute metabolic encephalopathy [id number]  . AMS (altered mental status) 07/31/2020  . Osteoarthritis of right knee 04/14/2020  . History of stroke 01/09/2016  . Acute blood loss anemia   . BRBPR (bright red blood per rectum) 09/30/2014  . DM (diabetes mellitus), type 2 with neurological complications ([place]) [id number]  . Diabetic retinopathy ([place]) 03/16/2013  . H/o Stroke /--history of chronic microhemorrhages 01/24/2011  . Coronary atherosclerosis of native coronary artery 12/13/2010  . Mixed hyperlipidemia 11/05/2010  . Essential hypertension, benign 11/05/2010  . GERD 11/05/2010    3:20 PM, 09/05/20 Mearl Latin PT, DPT Physical Therapist at [facility] [place], [place], [address] Phone: [phone]   Fax:  [phone]  Name: DYSTINY SEHORN MRN: [id number] Date of Birth: 12-11-1933

## 2020-09-05 NOTE — Addendum Note (Signed)
Addended by: Wyman Songster on: 09/05/2020 03:22 PM   Modules accepted: Orders

## 2020-09-07 ENCOUNTER — Ambulatory Visit (HOSPITAL_COMMUNITY): Payer: PPO | Admitting: Physical Therapy

## 2020-09-07 ENCOUNTER — Other Ambulatory Visit: Payer: Self-pay

## 2020-09-07 DIAGNOSIS — M6281 Muscle weakness (generalized): Secondary | ICD-10-CM

## 2020-09-07 DIAGNOSIS — R29898 Other symptoms and signs involving the musculoskeletal system: Secondary | ICD-10-CM

## 2020-09-07 DIAGNOSIS — R2689 Other abnormalities of gait and mobility: Secondary | ICD-10-CM

## 2020-09-07 NOTE — Patient Instructions (Signed)
Abduction: Clam (Eccentric) - Side-Lying    Lie on side with knees bent. Lift top knee, keeping feet together. Keep trunk steady. Slowly lower for 3-5 seconds. _10__ reps per set, _2__ sets per day  Bridge    Lie back, legs bent. Inhale, pressing hips up. Keeping ribs in, lengthen lower back. Exhale, rolling down along spine from top. Repeat _10__ times. Do _2__ sessions per day.  Straight Leg Raise    Tighten stomach and slowly raise locked right leg _10_ inches from floor. Repeat _10_ times per set. Do __2_ sets per session.  Functional Quadriceps: Sit to Stand    Sit on edge of chair, feet flat on floor. Stand upright, extending knees fully.  Repeat __10__ times per set. Do __2__ sets per session

## 2020-09-07 NOTE — Therapy (Signed)
[place], [place], [address] Phone: [phone]   Fax:  [phone]  Physical Therapy Treatment  Patient Details  Name: [name] MRN: [id number] Date of Birth: 1934/05/22 Referring Provider (PT): Malena Lovena Le DO   Encounter Date: 09/07/2020   PT End of Session - 09/07/20 1357    Visit Number 2    Number of Visits 8    Date for PT Re-Evaluation 10/03/20    Authorization Type Primary: [place] team advantage (no visit limit, no auth, follows medicare guidlines)    PT Start Time 1320    PT Stop Time 1400    PT Time Calculation (min) 40 min    Equipment Utilized During Treatment Gait belt    Activity Tolerance Patient tolerated treatment well    Behavior During Therapy [facility] for tasks assessed/performed           Past Medical History:  Diagnosis Date  . Allergic rhinitis   . Altered mental status    Per records at [facility]   . Cataracts, bilateral   . Cerebral infarct [facility])    Per records at [facility]   . Coronary atherosclerosis of native coronary artery    DES LAD 3/12, 99% nondominant RCA, No CAD otherwise, LVEF 65%  . Coronary atherosclerosis of native coronary artery    Per records at [facility]   . Diabetic retinopathy ([place])   . Disorientation, unspecified    Per records at [facility]   . Diverticulitis   . Essential hypertension   . GERD (gastroesophageal reflux disease)   . Hemorrhoids   . Hyperlipidemia   . Major depressive disorder with single episode    Per records at [facility]   . Metabolic encephalopathy    Per records at [facility]   . Muscle weakness (generalized)    Per records at [facility]   . Stroke, hemorrhagic ([place])    Right thalamic hemorrhage 4/12  . Syncope    Neurally mediated  . Syncope and collapse    Per records at [facility]   . Type 2 diabetes mellitus ([place])   .  Type 2 diabetes mellitus with unspecified diabetic retinopathy without macular edema ([place])    Per records at [facility]   . Unspecified dementia without behavioral disturbance ([place])    Per records at [facility]     Past Surgical History:  Procedure Laterality Date  . BREAST CYST EXCISION    . CARDIAC CATHETERIZATION    . CAROTID STENT  11/16/10  . COLONOSCOPY N/A 10/02/2014   Dr. Rourk:engorged internal hemorrhoids/pancolonic diverticulosis  . ESOPHAGOGASTRODUODENOSCOPY  10/02/14   Dr. Rourk:normal esophagus 2 cm hiatal hernia, gastric erosions likely from NSAID effect    There were no vitals filed for this visit.   Subjective Assessment - 09/07/20 1333    Subjective pt states she is doing well today without pain.  No recent falls or stumbles.    Currently in Pain? No/denies              [facility] PT Assessment - 09/07/20 0001      Strength   Right Hip ABduction 3-/5    Left Hip ABduction 4+/5                         [facility] Adult PT Treatment/Exercise - 09/07/20 0001  Ambulation/Gait   Ambulation/Gait Yes    Ambulation/Gait Assistance 4: Min guard    Ambulation Distance (Feet) 226 Feet    Assistive device Straight cane    Gait Pattern Poor foot clearance - left;Poor foot clearance - right;Trunk flexed    Ambulation Surface Indoor    Gait velocity decreased      Knee/Hip Exercises: Seated   Sit to Sand 10 reps;without UE support      Knee/Hip Exercises: Supine   Bridges Both;15 reps    Straight Leg Raises Both;10 reps      Knee/Hip Exercises: Sidelying   Clams 10                  PT Education - 09/07/20 1355    Education Details reviewed goals and POC moving forward.  Initiated HEP    Person(s) Educated Patient    Methods Explanation;Demonstration;Tactile cues;Handout;Verbal cues    Comprehension Verbalized understanding;Returned demonstration;Verbal cues required;Tactile cues required;Need further instruction             PT Short Term Goals - 09/07/20 1356      PT SHORT TERM GOAL #1   Title Patient will be independent with HEP in order to improve functional outcomes.    Time 2    Period Weeks    Status On-going    Target Date 09/19/20      PT SHORT TERM GOAL #2   Title Patient will report at least 25% improvement in symptoms for improved quality of life.    Time 2    Period Weeks    Status On-going    Target Date 09/19/20             PT Long Term Goals - 09/07/20 1356      PT LONG TERM GOAL #1   Title Patient will report at least 75% improvement in symptoms for improved quality of life.    Time 4    Period Weeks    Status On-going      PT LONG TERM GOAL #2   Title Patient will be able to complete 5x STS in under 14 seconds in order to reduce the risk of falls.    Time 4    Period Weeks    Status On-going      PT LONG TERM GOAL #3   Title Patient will score at least 15 points on the DGI in order to demonstrate improved balance to reduce the risk of falls.    Time 4    Period Weeks    Status On-going      PT LONG TERM GOAL #4   Title Patient will be able to ambulate at least 226 feet in 2MWT in order to demonstrate improved gait speed for community ambulation.    Time 4    Period Weeks    Status On-going                 Plan - 09/07/20 1405    Clinical Impression Statement Reviewed goals and POC moving forward.  Began session with gait training.  Pt tends to carry her [facility] rather than use for stability.  Worked on correct sequencing and cues to take larger, more equal steps.  Pt also tends to drag feet and stumble getting off cadence and increasing risk of fall.  Trial of gait using SPC in Lt UE as her Rt knee hurts, however unable to coordinate.  Tested hip abductors today with 4+/5 on Rt LE and 3-/5 on  Lt LE.  Initiated LE strengthening exercises and given written HEP as well. Pt required verbal and tactile cues to complete correctly with slow descent and control.  Also  required cues with keeping correct cound of exercises.    Personal Factors and Comorbidities Age;Fitness;Behavior Pattern;Past/Current Experience;Comorbidity 2;Social Background;Time since onset of injury/illness/exacerbation    Comorbidities impaired memory, CVA    Examination-Activity Limitations Locomotion Level;Transfers;Stairs;Squat;Stand;Lift;Caring for Others    Examination-Participation Restrictions Cleaning;Yard Work;Shop;Community Activity    Stability/Clinical Decision Making Stable/Uncomplicated    Rehab Potential Fair    PT Frequency 2x / week    PT Duration 4 weeks    PT Treatment/Interventions ADLs/Self Care Home Management;Aquatic Therapy;Biofeedback;Canalith Repostioning;Cryotherapy;Psychologist, educational;Iontophoresis 4mg /ml Dexamethasone;Traction;Ultrasound;Moist Heat;Gait training;Stair training;Functional mobility training;Therapeutic activities;Therapeutic exercise;Balance training;Neuromuscular re-education;Patient/family education;Orthotic Fit/Training;Manual techniques;Compression bandaging;Passive range of motion;Scar mobilization;Dry needling;Energy conservation;Splinting;Taping;Spinal Manipulations;Joint Manipulations    PT Next Visit Plan Progress functional strengthening, safety and independence with gait.  Begin static and dynamic balance training.    PT Home Exercise Plan 09/08/19: supine bridge, SLR, sidelying clams, sit to stands    Consulted and Agree with Plan of Care Patient           Patient will benefit from skilled therapeutic intervention in order to improve the following deficits and impairments:  Abnormal gait,Difficulty walking,Decreased endurance,Decreased activity tolerance,Decreased knowledge of use of DME,Decreased balance,Improper body mechanics,Decreased strength,Decreased mobility  Visit Diagnosis: Muscle weakness (generalized)  Other symptoms and signs involving the musculoskeletal system  Other abnormalities of gait and  mobility     Problem List Patient Active Problem List   Diagnosis Date Noted  . Chronic constipation 08/04/2020  . Major depression, recurrent, chronic (HCC) 08/04/2020  . Hyperlipidemia associated with type 2 diabetes mellitus (HCC) 08/04/2020  . Hyponatremia 08/04/2020  . Moderately advanced dementia without behavioral disturbance (HCC) 08/02/2020  . Acute metabolic encephalopathy 08/02/2020  . AMS (altered mental status) 07/31/2020  . Osteoarthritis of right knee 04/14/2020  . History of stroke 01/09/2016  . Acute blood loss anemia   . BRBPR (bright red blood per rectum) 09/30/2014  . DM (diabetes mellitus), type 2 with neurological complications (HCC) 03/16/2013  . Diabetic retinopathy (HCC) 03/16/2013  . H/o Stroke /--history of chronic microhemorrhages 01/24/2011  . Coronary atherosclerosis of native coronary artery 12/13/2010  . Mixed hyperlipidemia 11/05/2010  . Essential hypertension, benign 11/05/2010  . GERD 11/05/2010   01/05/2011, PTA/CLT [phone]  [phone] 09/07/2020, 4:16 PM  [facility] [address] [place], [place], [place] Phone: [phone]   Fax:  [phone]  Name: TAKEYA MARQUIS MRN: Milana Huntsman Date of Birth: 10/06/1933

## 2020-09-12 ENCOUNTER — Encounter (HOSPITAL_COMMUNITY): Payer: Self-pay | Admitting: Physical Therapy

## 2020-09-12 ENCOUNTER — Other Ambulatory Visit: Payer: Self-pay

## 2020-09-12 ENCOUNTER — Ambulatory Visit (HOSPITAL_COMMUNITY): Payer: PPO | Admitting: Physical Therapy

## 2020-09-12 DIAGNOSIS — R2689 Other abnormalities of gait and mobility: Secondary | ICD-10-CM | POA: Diagnosis not present

## 2020-09-12 DIAGNOSIS — M6281 Muscle weakness (generalized): Secondary | ICD-10-CM

## 2020-09-12 DIAGNOSIS — R29898 Other symptoms and signs involving the musculoskeletal system: Secondary | ICD-10-CM

## 2020-09-12 NOTE — Therapy (Signed)
[place], [place], [address] Phone: [phone]   Fax:  [phone]  Physical Therapy Treatment  Patient Details  Name: [name] MRN: [id number] Date of Birth: 02-25-1934 Referring Provider (PT): Malena Lovena Le DO   Encounter Date: 09/12/2020   PT End of Session - 09/12/20 1314    Visit Number 3    Number of Visits 8    Date for PT Re-Evaluation 10/03/20    Authorization Type Primary: [place] team advantage (no visit limit, no auth, follows medicare guidlines)    PT Start Time 1315    PT Stop Time 1354    PT Time Calculation (min) 39 min    Equipment Utilized During Treatment Gait belt    Activity Tolerance Patient tolerated treatment well    Behavior During Therapy WFL for tasks assessed/performed           Past Medical History:  Diagnosis Date  . Allergic rhinitis   . Altered mental status    Per records at [facility]   . Cataracts, bilateral   . Cerebral infarct [facility])    Per records at [facility]   . Coronary atherosclerosis of native coronary artery    DES LAD 3/12, 99% nondominant RCA, No CAD otherwise, LVEF 65%  . Coronary atherosclerosis of native coronary artery    Per records at [facility]   . Diabetic retinopathy ([place])   . Disorientation, unspecified    Per records at [facility]   . Diverticulitis   . Essential hypertension   . GERD (gastroesophageal reflux disease)   . Hemorrhoids   . Hyperlipidemia   . Major depressive disorder with single episode    Per records at [facility]   . Metabolic encephalopathy    Per records at [facility]   . Muscle weakness (generalized)    Per records at [facility]   . Stroke, hemorrhagic ([place])    Right thalamic hemorrhage 4/12  . Syncope    Neurally mediated  . Syncope and collapse    Per records at [facility]   . Type 2 diabetes mellitus ([place])   .  Type 2 diabetes mellitus with unspecified diabetic retinopathy without macular edema ([place])    Per records at [facility]   . Unspecified dementia without behavioral disturbance ([place])    Per records at [facility]     Past Surgical History:  Procedure Laterality Date  . BREAST CYST EXCISION    . CARDIAC CATHETERIZATION    . CAROTID STENT  11/16/10  . COLONOSCOPY N/A 10/02/2014   Dr. Rourk:engorged internal hemorrhoids/pancolonic diverticulosis  . ESOPHAGOGASTRODUODENOSCOPY  10/02/14   Dr. Rourk:normal esophagus 2 cm hiatal hernia, gastric erosions likely from NSAID effect    There were no vitals filed for this visit.   Subjective Assessment - 09/12/20 1315    Subjective Patient states things have been going well. She cant remember if there is anything new.    Currently in Pain? No/denies                             [facility] Adult PT Treatment/Exercise - 09/12/20 0001      Ambulation/Gait   Ambulation/Gait Yes    Ambulation/Gait Assistance 4: Min guard    Ambulation Distance (Feet) 226 Feet    Assistive device Straight cane    Gait  Pattern Poor foot clearance - left;Poor foot clearance - right;Trunk flexed    Ambulation Surface Level;Indoor    Gait velocity decreased    Gait Comments cueing for proper SPC use and step length      Knee/Hip Exercises: Standing   Heel Raises Both;15 reps    Heel Raises Limitations toe raises 15 reps    Knee Flexion 1 set;Both;10 reps    Hip Flexion Both;1 set;10 reps    Hip Abduction Both;2 sets;10 reps      Knee/Hip Exercises: Seated   Long Arc Quad Both;10 reps    Long Arc Quad Limitations 5 second holds    Ball Squeeze 10x 5 second holds    Sit to [employer] 10 reps;without UE support      Knee/Hip Exercises: Supine   Bridges Both;15 reps    Straight Leg Raises Both;15 reps      Knee/Hip Exercises: Sidelying   Clams Both 15 reps                  PT Education - 09/12/20 1314    Education Details  Patient educated on HEP, exercise mechanics    Person(s) Educated Patient    Methods Explanation;Demonstration    Comprehension Verbalized understanding;Returned demonstration            PT Short Term Goals - 09/07/20 1356      PT SHORT TERM GOAL #1   Title Patient will be independent with HEP in order to improve functional outcomes.    Time 2    Period Weeks    Status On-going    Target Date 09/19/20      PT SHORT TERM GOAL #2   Title Patient will report at least 25% improvement in symptoms for improved quality of life.    Time 2    Period Weeks    Status On-going    Target Date 09/19/20             PT Long Term Goals - 09/07/20 1356      PT LONG TERM GOAL #1   Title Patient will report at least 75% improvement in symptoms for improved quality of life.    Time 4    Period Weeks    Status On-going      PT LONG TERM GOAL #2   Title Patient will be able to complete 5x STS in under 14 seconds in order to reduce the risk of falls.    Time 4    Period Weeks    Status On-going      PT LONG TERM GOAL #3   Title Patient will score at least 15 points on the DGI in order to demonstrate improved balance to reduce the risk of falls.    Time 4    Period Weeks    Status On-going      PT LONG TERM GOAL #4   Title Patient will be able to ambulate at least 226 feet in 2MWT in order to demonstrate improved gait speed for community ambulation.    Time 4    Period Weeks    Status On-going                 Plan - 09/12/20 1314    Clinical Impression Statement Patient unsteady upon initially ambulating into clinic with loss of balance when using hand sanitizer and trying to reach for falling cane requiring assist for balance as she reaches for nearby wall avoiding fall. Patient requires cueing for  rest breaks as she tends to lose count after first several exercises completed. Patient requires cueing for avoiding using hip flexor with hip abduction strengthening exercise.  Patient continues to ambulate with limited use of SPC despite cueing for step length and proper use of cane. Patient will continue to benefit from skilled physical therapy in order to improve function and reduce impairment.    Personal Factors and Comorbidities Age;Fitness;Behavior Pattern;Past/Current Experience;Comorbidity 2;Social Background;Time since onset of injury/illness/exacerbation    Comorbidities impaired memory, CVA    Examination-Activity Limitations Locomotion Level;Transfers;Stairs;Squat;Stand;Lift;Caring for Others    Examination-Participation Restrictions Cleaning;Yard Work;Shop;Community Activity    Stability/Clinical Decision Making Stable/Uncomplicated    Rehab Potential Fair    PT Frequency 2x / week    PT Duration 4 weeks    PT Treatment/Interventions ADLs/Self Care Home Management;Aquatic Therapy;Biofeedback;Canalith Repostioning;Cryotherapy;Scientist, product/process development;Iontophoresis 4mg /ml Dexamethasone;Traction;Ultrasound;Moist Heat;Gait training;Stair training;Functional mobility training;Therapeutic activities;Therapeutic exercise;Balance training;Neuromuscular re-education;Patient/family education;Orthotic Fit/Training;Manual techniques;Compression bandaging;Passive range of motion;Scar mobilization;Dry needling;Energy conservation;Splinting;Taping;Spinal Manipulations;Joint Manipulations    PT Next Visit Plan Progress functional strengthening, safety and independence with gait.  Begin static and dynamic balance training.    PT Home Exercise Plan 09/08/19: supine bridge, SLR, sidelying clams, sit to stands    Consulted and Agree with Plan of Care Patient           Patient will benefit from skilled therapeutic intervention in order to improve the following deficits and impairments:  Abnormal gait,Difficulty walking,Decreased endurance,Decreased activity tolerance,Decreased knowledge of use of DME,Decreased balance,Improper body mechanics,Decreased  strength,Decreased mobility  Visit Diagnosis: Muscle weakness (generalized)  Other symptoms and signs involving the musculoskeletal system  Other abnormalities of gait and mobility     Problem List Patient Active Problem List   Diagnosis Date Noted  . Chronic constipation 08/04/2020  . Major depression, recurrent, chronic ([place]) 08/04/2020  . Hyperlipidemia associated with type 2 diabetes mellitus ([place]) 08/04/2020  . Hyponatremia 08/04/2020  . Moderately advanced dementia without behavioral disturbance ([place]) 08/02/2020  . Acute metabolic encephalopathy [id number]  . AMS (altered mental status) 07/31/2020  . Osteoarthritis of right knee 04/14/2020  . History of stroke 01/09/2016  . Acute blood loss anemia   . BRBPR (bright red blood per rectum) 09/30/2014  . DM (diabetes mellitus), type 2 with neurological complications ([place]) [id number]  . Diabetic retinopathy ([place]) 03/16/2013  . H/o Stroke /--history of chronic microhemorrhages 01/24/2011  . Coronary atherosclerosis of native coronary artery 12/13/2010  . Mixed hyperlipidemia 11/05/2010  . Essential hypertension, benign 11/05/2010  . GERD 11/05/2010    1:53 PM, 09/12/20 Mearl Latin PT, DPT Physical Therapist at [place], [place], [address] Phone: [phone]   Fax:  [phone]  Name: [name] MRN: [id number] Date of Birth: 1934/07/09

## 2020-09-14 ENCOUNTER — Other Ambulatory Visit: Payer: Self-pay

## 2020-09-14 ENCOUNTER — Ambulatory Visit (HOSPITAL_COMMUNITY): Payer: PPO | Admitting: Physical Therapy

## 2020-09-14 ENCOUNTER — Encounter (HOSPITAL_COMMUNITY): Payer: Self-pay | Admitting: Physical Therapy

## 2020-09-14 DIAGNOSIS — R2689 Other abnormalities of gait and mobility: Secondary | ICD-10-CM | POA: Diagnosis not present

## 2020-09-14 DIAGNOSIS — R29898 Other symptoms and signs involving the musculoskeletal system: Secondary | ICD-10-CM

## 2020-09-14 DIAGNOSIS — M6281 Muscle weakness (generalized): Secondary | ICD-10-CM

## 2020-09-14 NOTE — Therapy (Signed)
[place], [place], [address] Phone: [phone]   Fax:  [phone]  Physical Therapy Treatment  Patient Details  Name: [name] MRN: [id number] Date of Birth: October 04, 1933 Referring Provider (PT): Malena Lovena Le DO   Encounter Date: 09/14/2020   PT End of Session - 09/14/20 1315    Visit Number 4    Number of Visits 8    Date for PT Re-Evaluation 10/03/20    Authorization Type Primary: [employer] team advantage (no visit limit, no auth, follows medicare guidlines)    PT Start Time 1315    PT Stop Time 1355    PT Time Calculation (min) 40 min    Equipment Utilized During Treatment Gait belt    Activity Tolerance Patient tolerated treatment well    Behavior During Therapy [facility] for tasks assessed/performed           Past Medical History:  Diagnosis Date  . Allergic rhinitis   . Altered mental status    Per records at [facility]   . Cataracts, bilateral   . Cerebral infarct [facility])    Per records at [facility]   . Coronary atherosclerosis of native coronary artery    DES LAD 3/12, 99% nondominant RCA, No CAD otherwise, LVEF 65%  . Coronary atherosclerosis of native coronary artery    Per records at [facility]   . Diabetic retinopathy ([place])   . Disorientation, unspecified    Per records at [facility]   . Diverticulitis   . Essential hypertension   . GERD (gastroesophageal reflux disease)   . Hemorrhoids   . Hyperlipidemia   . Major depressive disorder with single episode    Per records at [facility]   . Metabolic encephalopathy    Per records at [facility]   . Muscle weakness (generalized)    Per records at [facility]   . Stroke, hemorrhagic ([place])    Right thalamic hemorrhage 4/12  . Syncope    Neurally mediated  . Syncope and collapse    Per records at [facility]   . Type 2 diabetes mellitus ([place])   .  Type 2 diabetes mellitus with unspecified diabetic retinopathy without macular edema ([place])    Per records at [facility]   . Unspecified dementia without behavioral disturbance ([place])    Per records at [facility]     Past Surgical History:  Procedure Laterality Date  . BREAST CYST EXCISION    . CARDIAC CATHETERIZATION    . CAROTID STENT  11/16/10  . COLONOSCOPY N/A 10/02/2014   Dr. Rourk:engorged internal hemorrhoids/pancolonic diverticulosis  . ESOPHAGOGASTRODUODENOSCOPY  10/02/14   Dr. Rourk:normal esophagus 2 cm hiatal hernia, gastric erosions likely from NSAID effect    There were no vitals filed for this visit.   Subjective Assessment - 09/14/20 1316    Subjective Patient states nothing new. She has not had any falls. She is not sure if she was tired after last time.    Currently in Pain? No/denies                             [facility] Adult PT Treatment/Exercise - 09/14/20 0001      Ambulation/Gait   Ambulation/Gait Yes    Ambulation/Gait Assistance 4: Min guard    Ambulation Distance (Feet) 226 Feet    Assistive device  Straight cane    Gait Pattern Poor foot clearance - left;Poor foot clearance - right;Trunk flexed    Gait velocity decreased    Gait Comments cueing for proper SPC use and step length and foot clearance throughout with limited carry over      Knee/Hip Exercises: Standing   Heel Raises Both;20 reps    Heel Raises Limitations toe raises 20 reps    Knee Flexion 1 set;Both;15 reps    Knee Flexion Limitations 2#    Hip Flexion Both;1 set;15 reps    Hip Flexion Limitations 2#    Hip Abduction Both;1 set;15 reps    Abduction Limitations 2#    Forward Step Up Both;2 sets;10 reps;Hand Hold: 2;Step Height: 6"    SLS with Vectors 5x 5 second holds bilateral      Knee/Hip Exercises: Seated   Sit to Sand 10 reps;without UE support;2 sets                  PT Education - 09/14/20 1316    Education Details Patient educated  on HEP, exercise mechanics    Person(s) Educated Patient    Methods Explanation;Demonstration    Comprehension Verbalized understanding;Returned demonstration            PT Short Term Goals - 09/07/20 1356      PT SHORT TERM GOAL #1   Title Patient will be independent with HEP in order to improve functional outcomes.    Time 2    Period Weeks    Status On-going    Target Date 09/19/20      PT SHORT TERM GOAL #2   Title Patient will report at least 25% improvement in symptoms for improved quality of life.    Time 2    Period Weeks    Status On-going    Target Date 09/19/20             PT Long Term Goals - 09/07/20 1356      PT LONG TERM GOAL #1   Title Patient will report at least 75% improvement in symptoms for improved quality of life.    Time 4    Period Weeks    Status On-going      PT LONG TERM GOAL #2   Title Patient will be able to complete 5x STS in under 14 seconds in order to reduce the risk of falls.    Time 4    Period Weeks    Status On-going      PT LONG TERM GOAL #3   Title Patient will score at least 15 points on the DGI in order to demonstrate improved balance to reduce the risk of falls.    Time 4    Period Weeks    Status On-going      PT LONG TERM GOAL #4   Title Patient will be able to ambulate at least 226 feet in 2MWT in order to demonstrate improved gait speed for community ambulation.    Time 4    Period Weeks    Status On-going                 Plan - 09/14/20 1315    Clinical Impression Statement Patient continues to ambulate with shuffled steps with poor foot clearance despite cueing for step length, cane use, and improving foot clearance. Patient tolerates additional reps of previously completed exercises. Patient requires unilateral UE support for step up exercise secondary to impaired dynamic balance. She requires cueing  for posture throughout session as she is always looking at her feet when completing exercises. Patient  with limited carry over to cueing with ambulation. Patient requires frequent cueing for mechanics of SLS with vectors. Patient will continue to benefit from skilled physical therapy in order to reduce impairment and improve function.    Personal Factors and Comorbidities Age;Fitness;Behavior Pattern;Past/Current Experience;Comorbidity 2;Social Background;Time since onset of injury/illness/exacerbation    Comorbidities impaired memory, CVA    Examination-Activity Limitations Locomotion Level;Transfers;Stairs;Squat;Stand;Lift;Caring for Others    Examination-Participation Restrictions Cleaning;Yard Work;Shop;Community Activity    Stability/Clinical Decision Making Stable/Uncomplicated    Rehab Potential Fair    PT Frequency 2x / week    PT Duration 4 weeks    PT Treatment/Interventions ADLs/Self Care Home Management;Aquatic Therapy;Biofeedback;Canalith Repostioning;Cryotherapy;Scientist, product/process development;Iontophoresis 4mg /ml Dexamethasone;Traction;Ultrasound;Moist Heat;Gait training;Stair training;Functional mobility training;Therapeutic activities;Therapeutic exercise;Balance training;Neuromuscular re-education;Patient/family education;Orthotic Fit/Training;Manual techniques;Compression bandaging;Passive range of motion;Scar mobilization;Dry needling;Energy conservation;Splinting;Taping;Spinal Manipulations;Joint Manipulations    PT Next Visit Plan Progress functional strengthening, safety and independence with gait.  continue static and dynamic balance training. continue with gait training    PT Home Exercise Plan 09/08/19: supine bridge, SLR, sidelying clams, sit to stands    Consulted and Agree with Plan of Care Patient           Patient will benefit from skilled therapeutic intervention in order to improve the following deficits and impairments:  Abnormal gait,Difficulty walking,Decreased endurance,Decreased activity tolerance,Decreased knowledge of use of DME,Decreased balance,Improper  body mechanics,Decreased strength,Decreased mobility  Visit Diagnosis: Muscle weakness (generalized)  Other symptoms and signs involving the musculoskeletal system  Other abnormalities of gait and mobility     Problem List Patient Active Problem List   Diagnosis Date Noted  . Chronic constipation 08/04/2020  . Major depression, recurrent, chronic ([place]) 08/04/2020  . Hyperlipidemia associated with type 2 diabetes mellitus ([place]) 08/04/2020  . Hyponatremia 08/04/2020  . Moderately advanced dementia without behavioral disturbance ([place]) 08/02/2020  . Acute metabolic encephalopathy 54/56/2563  . AMS (altered mental status) 07/31/2020  . Osteoarthritis of right knee 04/14/2020  . History of stroke 01/09/2016  . Acute blood loss anemia   . BRBPR (bright red blood per rectum) 09/30/2014  . DM (diabetes mellitus), type 2 with neurological complications ([place]) 89/37/3428  . Diabetic retinopathy ([place]) 03/16/2013  . H/o Stroke /--history of chronic microhemorrhages 01/24/2011  . Coronary atherosclerosis of native coronary artery 12/13/2010  . Mixed hyperlipidemia 11/05/2010  . Essential hypertension, benign 11/05/2010  . GERD 11/05/2010    1:55 PM, 09/14/20 Mearl Latin PT, DPT Physical Therapist at [place], [place], [address] Phone: [phone]   Fax:  [phone]  Name: HIILEI GERST MRN: [id number] Date of Birth: [birth year]

## 2020-09-19 ENCOUNTER — Ambulatory Visit (HOSPITAL_COMMUNITY): Payer: PPO | Admitting: Physical Therapy

## 2020-09-21 ENCOUNTER — Other Ambulatory Visit: Payer: Self-pay

## 2020-09-21 ENCOUNTER — Ambulatory Visit (HOSPITAL_COMMUNITY): Payer: PPO | Admitting: Physical Therapy

## 2020-09-21 ENCOUNTER — Encounter (HOSPITAL_COMMUNITY): Payer: Self-pay | Admitting: Physical Therapy

## 2020-09-21 DIAGNOSIS — R29898 Other symptoms and signs involving the musculoskeletal system: Secondary | ICD-10-CM

## 2020-09-21 DIAGNOSIS — M6281 Muscle weakness (generalized): Secondary | ICD-10-CM

## 2020-09-21 DIAGNOSIS — R2689 Other abnormalities of gait and mobility: Secondary | ICD-10-CM

## 2020-09-21 NOTE — Therapy (Signed)
[place], [place], [address] Phone: [phone]   Fax:  [phone]  Physical Therapy Treatment  Patient Details  Name: [name] MRN: [id number] Date of Birth: Aug 19, 1934 Referring Provider (PT): Malena Lovena Le DO   Encounter Date: 09/21/2020   PT End of Session - 09/21/20 1311    Visit Number 5    Number of Visits 8    Date for PT Re-Evaluation 10/03/20    Authorization Type Primary: [place] team advantage (no visit limit, no auth, follows medicare guidlines)    PT Start Time 1315    PT Stop Time 1355    PT Time Calculation (min) 40 min    Equipment Utilized During Treatment Gait belt    Activity Tolerance Patient tolerated treatment well    Behavior During Therapy [facility] for tasks assessed/performed           Past Medical History:  Diagnosis Date  . Allergic rhinitis   . Altered mental status    Per records at [facility]   . Cataracts, bilateral   . Cerebral infarct [facility])    Per records at [facility]   . Coronary atherosclerosis of native coronary artery    DES LAD 3/12, 99% nondominant RCA, No CAD otherwise, LVEF 65%  . Coronary atherosclerosis of native coronary artery    Per records at [facility]   . Diabetic retinopathy ([place])   . Disorientation, unspecified    Per records at [facility]   . Diverticulitis   . Essential hypertension   . GERD (gastroesophageal reflux disease)   . Hemorrhoids   . Hyperlipidemia   . Major depressive disorder with single episode    Per records at [facility]   . Metabolic encephalopathy    Per records at [facility]   . Muscle weakness (generalized)    Per records at [facility]   . Stroke, hemorrhagic ([place])    Right thalamic hemorrhage 4/12  . Syncope    Neurally mediated  . Syncope and collapse    Per records at [name]   . Type 2 diabetes mellitus ([place])   .  Type 2 diabetes mellitus with unspecified diabetic retinopathy without macular edema ([place])    Per records at [facility]   . Unspecified dementia without behavioral disturbance ([place])    Per records at [facility]     Past Surgical History:  Procedure Laterality Date  . BREAST CYST EXCISION    . CARDIAC CATHETERIZATION    . CAROTID STENT  11/16/10  . COLONOSCOPY N/A 10/02/2014   Dr. Rourk:engorged internal hemorrhoids/pancolonic diverticulosis  . ESOPHAGOGASTRODUODENOSCOPY  10/02/14   Dr. Rourk:normal esophagus 2 cm hiatal hernia, gastric erosions likely from NSAID effect    There were no vitals filed for this visit.   Subjective Assessment - 09/21/20 1317    Subjective Patient states shes doing well and has slight pain in her knee 2-3/10. No reported falls. She reports she has not been doing her HEP.    Currently in Pain? Yes    Pain Score 2     Pain Location Knee    Pain Orientation Right                             [facility] Adult PT Treatment/Exercise - 09/21/20 0001      Ambulation/Gait  Ambulation/Gait Yes    Ambulation/Gait Assistance 4: Min guard    Ambulation Distance (Feet) 226 Feet    Assistive device Straight cane    Gait Pattern Poor foot clearance - left;Poor foot clearance - right;Trunk flexed    Ambulation Surface Level;Indoor    Gait velocity decreased    Gait Comments cues for big steps, using SPC at right time,. Improved using SPC when not actively thinking about positioning.      Knee/Hip Exercises: Standing   Other Standing Knee Exercises small hurdles with verbal cues for hip flexion vs circumduction      Knee/Hip Exercises: Seated   Long Arc Quad Strengthening;Both;2 sets;10 reps    Marching Strengthening;Both;2 sets;10 reps    Abduction/Adduction  Strengthening;Both;2 sets;10 reps      Knee/Hip Exercises: Supine   Heel Slides Strengthening;Both;2 sets;10 reps    Other Supine Knee/Hip Exercises Glute sets 10x5 sec  holds                  PT Education - 09/21/20 1356    Education Details patient educated on HEP, exercise mechanics, importance of HEP at home    Person(s) Educated Patient    Methods Explanation;Demonstration    Comprehension Verbalized understanding            PT Short Term Goals - 09/07/20 1356      PT SHORT TERM GOAL #1   Title Patient will be independent with HEP in order to improve functional outcomes.    Time 2    Period Weeks    Status On-going    Target Date 09/19/20      PT SHORT TERM GOAL #2   Title Patient will report at least 25% improvement in symptoms for improved quality of life.    Time 2    Period Weeks    Status On-going    Target Date 09/19/20             PT Long Term Goals - 09/07/20 1356      PT LONG TERM GOAL #1   Title Patient will report at least 75% improvement in symptoms for improved quality of life.    Time 4    Period Weeks    Status On-going      PT LONG TERM GOAL #2   Title Patient will be able to complete 5x STS in under 14 seconds in order to reduce the risk of falls.    Time 4    Period Weeks    Status On-going      PT LONG TERM GOAL #3   Title Patient will score at least 15 points on the DGI in order to demonstrate improved balance to reduce the risk of falls.    Time 4    Period Weeks    Status On-going      PT LONG TERM GOAL #4   Title Patient will be able to ambulate at least 226 feet in 2MWT in order to demonstrate improved gait speed for community ambulation.    Time 4    Period Weeks    Status On-going                 Plan - 09/21/20 1357    Clinical Impression Statement Patient continued to demonstrate increased shuffling during ambulaton, but continued difficulty with coordination between ambulating and cane use, preferencing to take a few shuffle steps before placing cane down. Report increased difficulty with compliance in HEP, resulting in attempting to  complete activites that she can  complete in her recliner. She continues to present with difficulties with active hip flexion, preferring circumduction throughout obstacle negotiation on her RLE. She continues to demo increased fatigue and decreased endurance during standing activities, impacting her ability to ambulate community distances.    Personal Factors and Comorbidities Age;Fitness;Behavior Pattern;Past/Current Experience;Comorbidity 2;Social Background;Time since onset of injury/illness/exacerbation    Comorbidities impaired memory, CVA    Examination-Activity Limitations Locomotion Level;Transfers;Stairs;Squat;Stand;Lift;Caring for Others    Examination-Participation Restrictions Cleaning;Yard Work;Shop;Community Activity    Stability/Clinical Decision Making Stable/Uncomplicated    Rehab Potential Fair    PT Frequency 2x / week    PT Duration 4 weeks    PT Treatment/Interventions ADLs/Self Care Home Management;Aquatic Therapy;Biofeedback;Canalith Repostioning;Cryotherapy;Scientist, product/process development;Iontophoresis 4mg /ml Dexamethasone;Traction;Ultrasound;Moist Heat;Gait training;Stair training;Functional mobility training;Therapeutic activities;Therapeutic exercise;Balance training;Neuromuscular re-education;Patient/family education;Orthotic Fit/Training;Manual techniques;Compression bandaging;Passive range of motion;Scar mobilization;Dry needling;Energy conservation;Splinting;Taping;Spinal Manipulations;Joint Manipulations    PT Next Visit Plan Progress functional strengthening, safety and independence with gait.  continue static and dynamic balance training. continue with gait training    PT Home Exercise Plan 09/08/19: supine bridge, SLR, sidelying clams, sit to stands 1/20: hip abd add and heel slides to complete in recliner.    Consulted and Agree with Plan of Care Patient           Patient will benefit from skilled therapeutic intervention in order to improve the following deficits and impairments:   Abnormal gait,Difficulty walking,Decreased endurance,Decreased activity tolerance,Decreased knowledge of use of DME,Decreased balance,Improper body mechanics,Decreased strength,Decreased mobility  Visit Diagnosis: Muscle weakness (generalized)  Other symptoms and signs involving the musculoskeletal system  Other abnormalities of gait and mobility     Problem List Patient Active Problem List   Diagnosis Date Noted  . Chronic constipation 08/04/2020  . Major depression, recurrent, chronic ([place]) 08/04/2020  . Hyperlipidemia associated with type 2 diabetes mellitus ([place]) 08/04/2020  . Hyponatremia 08/04/2020  . Moderately advanced dementia without behavioral disturbance ([place]) 08/02/2020  . Acute metabolic encephalopathy [id number]  . AMS (altered mental status) 07/31/2020  . Osteoarthritis of right knee 04/14/2020  . History of stroke 01/09/2016  . Acute blood loss anemia   . BRBPR (bright red blood per rectum) 09/30/2014  . DM (diabetes mellitus), type 2 with neurological complications ([place]) [id number]  . Diabetic retinopathy ([place]) 03/16/2013  . H/o Stroke /--history of chronic microhemorrhages 01/24/2011  . Coronary atherosclerosis of native coronary artery 12/13/2010  . Mixed hyperlipidemia 11/05/2010  . Essential hypertension, benign 11/05/2010  . GERD 11/05/2010    2:20 PM,09/21/20 Domenic Moras, PT, DPT Physical Therapist at [place], [place], [address] Phone: [phone]   Fax:  [phone]  Name: [name] MRN: [id number] Date of Birth: July 26, 1934

## 2020-09-25 ENCOUNTER — Ambulatory Visit: Payer: PPO | Admitting: Family Medicine

## 2020-09-26 ENCOUNTER — Ambulatory Visit (HOSPITAL_COMMUNITY): Payer: PPO | Admitting: Physical Therapy

## 2020-09-26 ENCOUNTER — Encounter (HOSPITAL_COMMUNITY): Payer: Self-pay | Admitting: Physical Therapy

## 2020-09-26 ENCOUNTER — Other Ambulatory Visit: Payer: Self-pay

## 2020-09-26 DIAGNOSIS — R29898 Other symptoms and signs involving the musculoskeletal system: Secondary | ICD-10-CM

## 2020-09-26 DIAGNOSIS — R2689 Other abnormalities of gait and mobility: Secondary | ICD-10-CM | POA: Diagnosis not present

## 2020-09-26 DIAGNOSIS — M6281 Muscle weakness (generalized): Secondary | ICD-10-CM

## 2020-09-26 NOTE — Therapy (Signed)
[facility] [address] [place], [place], [address] Phone: [phone]   Fax:  [phone]  Physical Therapy Treatment  Patient Details  Name: [name] MRN: [id number] Date of Birth: 10-Oct-1933 Referring Provider (PT): Malena Lovena Le DO   Encounter Date: 09/26/2020   PT End of Session - 09/26/20 1309    Visit Number 6    Number of Visits 8    Date for PT Re-Evaluation 10/03/20    Authorization Type Primary: [employer] team [employer] (no visit limit, no auth, follows medicare guidlines)    PT Start Time 1310    PT Stop Time 1350    PT Time Calculation (min) 40 min    Equipment Utilized During Treatment Gait belt    Activity Tolerance Patient tolerated treatment well    Behavior During Therapy [facility] for tasks assessed/performed           Past Medical History:  Diagnosis Date  . Allergic rhinitis   . Altered mental status    Per records at [facility]   . Cataracts, bilateral   . Cerebral infarct [facility])    Per records at [facility]   . Coronary atherosclerosis of native coronary artery    DES LAD 3/12, 99% nondominant RCA, No CAD otherwise, LVEF 65%  . Coronary atherosclerosis of native coronary artery    Per records at [facility]   . Diabetic retinopathy ([place])   . Disorientation, unspecified    Per records at [facility]   . Diverticulitis   . Essential hypertension   . GERD (gastroesophageal reflux disease)   . Hemorrhoids   . Hyperlipidemia   . Major depressive disorder with single episode    Per records at [facility]   . Metabolic encephalopathy    Per records at [facility]   . Muscle weakness (generalized)    Per records at [facility]   . Stroke, hemorrhagic ([place])    Right thalamic hemorrhage 4/12  . Syncope    Neurally mediated  . Syncope and collapse    Per records at [facility]   . Type 2 diabetes mellitus ([place])   .  Type 2 diabetes mellitus with unspecified diabetic retinopathy without macular edema ([place])    Per records at [facility]   . Unspecified dementia without behavioral disturbance ([place])    Per records at [facility]     Past Surgical History:  Procedure Laterality Date  . BREAST CYST EXCISION    . CARDIAC CATHETERIZATION    . CAROTID STENT  11/16/10  . COLONOSCOPY N/A 10/02/2014   Dr. Rourk:engorged internal hemorrhoids/pancolonic diverticulosis  . ESOPHAGOGASTRODUODENOSCOPY  10/02/14   Dr. Rourk:normal esophagus 2 cm hiatal hernia, gastric erosions likely from NSAID effect    There were no vitals filed for this visit.   Subjective Assessment - 09/26/20 1310    Subjective Patient states no falls, nothing new to report. She has not been doing her exercises.    Currently in Pain? Yes    Pain Score 2     Pain Location Knee    Pain Orientation Right                             [facility] Adult PT Treatment/Exercise - 09/26/20 0001      Knee/Hip Exercises: Standing   Heel Raises Both;20 reps    Heel  Raises Limitations toe raises 20 reps    Knee Flexion Both;2 sets;10 reps    Knee Flexion Limitations 3#    Hip Flexion Both;2 sets;10 reps    Hip Flexion Limitations 3#    Hip Abduction 2 sets;10 reps    Abduction Limitations 3#    Forward Step Up Both;2 sets;Hand Hold: 2;Step Height: 6";15 reps      Knee/Hip Exercises: Seated   Long Arc Quad Both;1 set;15 reps    Long [employer] Limitations 5 second holds    Sit to [employer] 10 reps;without UE support;2 sets                  PT Education - 09/26/20 1310    Education Details Patient educated on HEP    Person(s) Educated Patient    Methods Explanation;Demonstration    Comprehension Verbalized understanding;Returned demonstration            PT Short Term Goals - 09/07/20 1356      PT SHORT TERM GOAL #1   Title Patient will be independent with HEP in order to improve functional outcomes.     Time 2    Period Weeks    Status On-going    Target Date 09/19/20      PT SHORT TERM GOAL #2   Title Patient will report at least 25% improvement in symptoms for improved quality of life.    Time 2    Period Weeks    Status On-going    Target Date 09/19/20             PT Long Term Goals - 09/07/20 1356      PT LONG TERM GOAL #1   Title Patient will report at least 75% improvement in symptoms for improved quality of life.    Time 4    Period Weeks    Status On-going      PT LONG TERM GOAL #2   Title Patient will be able to complete 5x STS in under 14 seconds in order to reduce the risk of falls.    Time 4    Period Weeks    Status On-going      PT LONG TERM GOAL #3   Title Patient will score at least 15 points on the DGI in order to demonstrate improved balance to reduce the risk of falls.    Time 4    Period Weeks    Status On-going      PT LONG TERM GOAL #4   Title Patient will be able to ambulate at least 226 feet in 2MWT in order to demonstrate improved gait speed for community ambulation.    Time 4    Period Weeks    Status On-going                 Plan - 09/26/20 1309    Clinical Impression Statement Patient unsure if she is ready to be discharged from therapy despite non-compliance with HEP. Patient able to complete increased reps/resistance today with previously performed exercises. Patient requires several seated rest breaks secondary to fatigue. Patient given cueing for posture and exercises mechanics throughout session with good carry over. Patient continues to ambulate with poor use of SPC despite frequent cueing today and in prior sessions. Will reassess patient next session. Patient will continue to benefit from skilled physical therapy in order to improve function and reduce impairment.    Personal Factors and Comorbidities Age;Fitness;Behavior Pattern;Past/Current Experience;Comorbidity 2;Social Background;Time since onset of  injury/illness/exacerbation    Comorbidities impaired memory, CVA    Examination-Activity Limitations Locomotion Level;Transfers;Stairs;Squat;Stand;Lift;Caring for Others    Examination-Participation Restrictions Cleaning;Yard Work;Shop;Community Activity    Stability/Clinical Decision Making Stable/Uncomplicated    Rehab Potential Fair    PT Frequency 2x / week    PT Duration 4 weeks    PT Treatment/Interventions ADLs/Self Care Home Management;Aquatic Therapy;Biofeedback;Canalith Repostioning;Cryotherapy;Scientist, product/process development;Iontophoresis 4mg /ml Dexamethasone;Traction;Ultrasound;Moist Heat;Gait training;Stair training;Functional mobility training;Therapeutic activities;Therapeutic exercise;Balance training;Neuromuscular re-education;Patient/family education;Orthotic Fit/Training;Manual techniques;Compression bandaging;Passive range of motion;Scar mobilization;Dry needling;Energy conservation;Splinting;Taping;Spinal Manipulations;Joint Manipulations    PT Next Visit Plan Progress functional strengthening, safety and independence with gait.  continue static and dynamic balance training. continue with gait training, reassess    PT Home Exercise Plan 09/08/19: supine bridge, SLR, sidelying clams, sit to stands 1/20: hip abd add and heel slides to complete in recliner.    Consulted and Agree with Plan of Care Patient           Patient will benefit from skilled therapeutic intervention in order to improve the following deficits and impairments:  Abnormal gait,Difficulty walking,Decreased endurance,Decreased activity tolerance,Decreased knowledge of use of DME,Decreased balance,Improper body mechanics,Decreased strength,Decreased mobility  Visit Diagnosis: Muscle weakness (generalized)  Other symptoms and signs involving the musculoskeletal system  Other abnormalities of gait and mobility     Problem List Patient Active Problem List   Diagnosis Date Noted  . Chronic  constipation 08/04/2020  . Major depression, recurrent, chronic ([place]) 08/04/2020  . Hyperlipidemia associated with type 2 diabetes mellitus ([place]) 08/04/2020  . Hyponatremia 08/04/2020  . Moderately advanced dementia without behavioral disturbance ([place]) 08/02/2020  . Acute metabolic encephalopathy 66/44/0347  . AMS (altered mental status) 07/31/2020  . Osteoarthritis of right knee 04/14/2020  . History of stroke 01/09/2016  . Acute blood loss anemia   . BRBPR (bright red blood per rectum) 09/30/2014  . DM (diabetes mellitus), type 2 with neurological complications ([place]) 42/59/5638  . Diabetic retinopathy ([place]) 03/16/2013  . H/o Stroke /--history of chronic microhemorrhages 01/24/2011  . Coronary atherosclerosis of native coronary artery 12/13/2010  . Mixed hyperlipidemia 11/05/2010  . Essential hypertension, benign 11/05/2010  . GERD 11/05/2010    1:50 PM, 09/26/20 Mearl Latin PT, DPT Physical Therapist at [facility] [place], [place], [address] Phone: [phone]   Fax:  [phone]  Name: [name] MRN: [id number] Date of Birth: [birth year]

## 2020-09-28 ENCOUNTER — Encounter (HOSPITAL_COMMUNITY): Payer: Self-pay | Admitting: Physical Therapy

## 2020-09-28 ENCOUNTER — Ambulatory Visit (HOSPITAL_COMMUNITY): Payer: PPO | Admitting: Physical Therapy

## 2020-09-28 ENCOUNTER — Other Ambulatory Visit: Payer: Self-pay

## 2020-09-28 DIAGNOSIS — R2689 Other abnormalities of gait and mobility: Secondary | ICD-10-CM | POA: Diagnosis not present

## 2020-09-28 DIAGNOSIS — M6281 Muscle weakness (generalized): Secondary | ICD-10-CM

## 2020-09-28 DIAGNOSIS — R29898 Other symptoms and signs involving the musculoskeletal system: Secondary | ICD-10-CM

## 2020-09-28 NOTE — Therapy (Signed)
[facility] [place], [place], [address] Phone: [phone]   Fax:  [phone]  Physical Therapy Treatment/ Discharge Summary  Patient Details  Name: [name] MRN: [id number] Date of Birth: [birth year] Referring Provider (PT): Malena Lovena Le DO   Encounter Date: 09/28/2020  PHYSICAL THERAPY DISCHARGE SUMMARY  Visits from Start of Care: 7  Current functional level related to goals / functional outcomes: See below   Remaining deficits: See below   Education / Equipment: See below  Plan: Patient agrees to discharge.  Patient goals were met. Patient is being discharged due to meeting the stated rehab goals.  ?????        PT End of Session - 09/28/20 1315    Visit Number 7    Number of Visits 8    Date for PT Re-Evaluation 10/03/20    Authorization Type Primary: [place] team advantage (no visit limit, no auth, follows medicare guidlines)    PT Start Time 1315    PT Stop Time 1353    PT Time Calculation (min) 38 min    Equipment Utilized During Treatment Gait belt    Activity Tolerance Patient tolerated treatment well    Behavior During Therapy WFL for tasks assessed/performed           Past Medical History:  Diagnosis Date  . Allergic rhinitis   . Altered mental status    Per records at [facility]   . Cataracts, bilateral   . Cerebral infarct [facility])    Per records at [facility]   . Coronary atherosclerosis of native coronary artery    DES LAD 3/12, 99% nondominant RCA, No CAD otherwise, LVEF 65%  . Coronary atherosclerosis of native coronary artery    Per records at [facility]   . Diabetic retinopathy ([place])   . Disorientation, unspecified    Per records at [facility]   . Diverticulitis   . Essential hypertension   . GERD (gastroesophageal reflux disease)   . Hemorrhoids   . Hyperlipidemia   . Major depressive disorder with single episode     Per records at [facility]   . Metabolic encephalopathy    Per records at [facility]   . Muscle weakness (generalized)    Per records at [facility]   . Stroke, hemorrhagic ([place])    Right thalamic hemorrhage 4/12  . Syncope    Neurally mediated  . Syncope and collapse    Per records at [facility]   . Type 2 diabetes mellitus ([place])   . Type 2 diabetes mellitus with unspecified diabetic retinopathy without macular edema ([place])    Per records at [facility]   . Unspecified dementia without behavioral disturbance ([place])    Per records at [facility]     Past Surgical History:  Procedure Laterality Date  . BREAST CYST EXCISION    . CARDIAC CATHETERIZATION    . CAROTID STENT  11/16/10  . COLONOSCOPY N/A 10/02/2014   Dr. Rourk:engorged internal hemorrhoids/pancolonic diverticulosis  . ESOPHAGOGASTRODUODENOSCOPY  10/02/14   Dr. Rourk:normal esophagus 2 cm hiatal hernia, gastric erosions likely from NSAID effect    There were no vitals filed for this visit.   Subjective Assessment - 09/28/20 1317    Subjective Patient states she did some of her home exercises and did the marching ones yesterday. Patient states 80% improvement with physical therapy intervention. She feels limited  with walking. She has not had any falls.    Currently in Pain? No/denies              [facility] PT Assessment - 09/28/20 0001      Assessment   Medical Diagnosis Cerebral infarction, syncope and collapse; Weakness, gait, balance    Referring Provider (PT) Malena Lovena Le DO    Onset Date/Surgical Date 08/02/20    Next MD Visit unsure    Prior Therapy hospital      Precautions   Precautions Fall      Restrictions   Weight Bearing Restrictions No      Balance Screen   Has the patient fallen in the past 6 months Yes    How many times? 2   none since beginning therapy   Has the patient had a decrease in activity level because of a fear of falling?  No     Is the patient reluctant to leave their home because of a fear of falling?  No      Prior Function   Level of Independence Independent with basic ADLs    Vocation Retired      Associate Professor   Overall Cognitive Status History of cognitive impairments - at baseline   forgetful     Observation/Other Assessments   Observations Ambulates with SPC, carries cane mostly with unsteady cadence      Strength   Right Hip Flexion 4+/5    Left Hip Flexion 5/5    Right Knee Flexion 5/5    Right Knee Extension 5/5    Left Knee Flexion 5/5    Left Knee Extension 5/5    Right Ankle Dorsiflexion 5/5    Left Ankle Dorsiflexion 5/5      Transfers   Five time sit to stand comments  11.4 seconds without UE use      Ambulation/Gait   Ambulation/Gait Yes    Ambulation/Gait Assistance 4: Min guard    Ambulation Distance (Feet) 305 Feet    Assistive device Straight cane    Gait Pattern Poor foot clearance - left;Poor foot clearance - right;Trunk flexed    Ambulation Surface Level;Indoor    Gait velocity decreased    Gait Comments 2MWT, minimal use of SPC, mostly carries cane      Dynamic Gait Index   Level Surface Mild Impairment    Change in Gait Speed Mild Impairment    Gait with Horizontal Head Turns Moderate Impairment    Gait with Vertical Head Turns Moderate Impairment    Gait and Pivot Turn Mild Impairment    Step Over Obstacle Moderate Impairment    Step Around Obstacles Mild Impairment    Steps Mild Impairment    Total Score 13    DGI comment: increased risk for falls                         [facility] Adult PT Treatment/Exercise - 09/28/20 0001      Knee/Hip Exercises: Standing   Heel Raises Both;20 reps    Heel Raises Limitations toe raises 20 reps    Knee Flexion Both;2 sets;10 reps    Knee Flexion Limitations 3#    Hip Flexion Both;2 sets;10 reps    Hip Flexion Limitations 3#    Forward Step Up Both;2 sets;Hand Hold: 2;Step Height: 6";15 reps                   PT Education - 09/28/20 1316  Education Details Patient educated on HEP, reassessment findings    Person(s) Educated Patient    Methods Explanation;Demonstration    Comprehension Verbalized understanding;Returned demonstration            PT Short Term Goals - 09/28/20 1331      PT SHORT TERM GOAL #1   Title Patient will be independent with HEP in order to improve functional outcomes.    Time 2    Period Weeks    Status Achieved    Target Date 09/19/20      PT SHORT TERM GOAL #2   Title Patient will report at least 25% improvement in symptoms for improved quality of life.    Time 2    Period Weeks    Status Achieved    Target Date 09/19/20             PT Long Term Goals - 09/28/20 1331      PT LONG TERM GOAL #1   Title Patient will report at least 75% improvement in symptoms for improved quality of life.    Time 4    Period Weeks    Status Achieved      PT LONG TERM GOAL #2   Title Patient will be able to complete 5x STS in under 14 seconds in order to reduce the risk of falls.    Time 4    Period Weeks    Status Achieved      PT LONG TERM GOAL #3   Title Patient will score at least 15 points on the DGI in order to demonstrate improved balance to reduce the risk of falls.    Time 4    Period Weeks    Status Not Met      PT LONG TERM GOAL #4   Title Patient will be able to ambulate at least 226 feet in 2MWT in order to demonstrate improved gait speed for community ambulation.    Time 4    Period Weeks    Status Achieved                 Plan - 09/28/20 1316    Clinical Impression Statement Patient has met 2/2 short term goals and 3/4 long term goals with ability to complete HEP and improvement in symptoms, strength, gait, functional mobility and balance. Patient remains limited by impaired dynamic balance and remains a risk for falls as indicated by DGI score. Patient ambulates with limited use of SPC despite frequent cueing  throughout all sessions for cane use and gait mechanics. Patient educated on importance of continuing HEP and returning to physical therapy if needed. Patient discharged from physical therapy at this time.    Personal Factors and Comorbidities Age;Fitness;Behavior Pattern;Past/Current Experience;Comorbidity 2;Social Background;Time since onset of injury/illness/exacerbation    Comorbidities impaired memory, CVA    Examination-Activity Limitations Locomotion Level;Transfers;Stairs;Squat;Stand;Lift;Caring for Others    Examination-Participation Restrictions Cleaning;Yard Work;Shop;Community Activity    Stability/Clinical Decision Making Stable/Uncomplicated    Rehab Potential Fair    PT Frequency 2x / week    PT Duration --    PT Treatment/Interventions ADLs/Self Care Home Management;Aquatic Therapy;Biofeedback;Canalith Repostioning;Cryotherapy;Scientist, product/process development;Iontophoresis 71m/ml Dexamethasone;Traction;Ultrasound;Moist Heat;Gait training;Stair training;Functional mobility training;Therapeutic activities;Therapeutic exercise;Balance training;Neuromuscular re-education;Patient/family education;Orthotic Fit/Training;Manual techniques;Compression bandaging;Passive range of motion;Scar mobilization;Dry needling;Energy conservation;Splinting;Taping;Spinal Manipulations;Joint Manipulations    PT Next Visit Plan n/a    PT Home Exercise Plan 09/08/19: supine bridge, SLR, sidelying clams, sit to stands 1/20: hip abd add and heel slides to complete in recliner.  Consulted and Agree with Plan of Care Patient           Patient will benefit from skilled therapeutic intervention in order to improve the following deficits and impairments:  Abnormal gait,Difficulty walking,Decreased endurance,Decreased activity tolerance,Decreased knowledge of use of DME,Decreased balance,Improper body mechanics,Decreased strength,Decreased mobility  Visit Diagnosis: Muscle weakness (generalized)  Other  symptoms and signs involving the musculoskeletal system  Other abnormalities of gait and mobility     Problem List Patient Active Problem List   Diagnosis Date Noted  . Chronic constipation 08/04/2020  . Major depression, recurrent, chronic ([place]) 08/04/2020  . Hyperlipidemia associated with type 2 diabetes mellitus ([place]) 08/04/2020  . Hyponatremia 08/04/2020  . Moderately advanced dementia without behavioral disturbance ([place]) 08/02/2020  . Acute metabolic encephalopathy 16/75/6125  . AMS (altered mental status) 07/31/2020  . Osteoarthritis of right knee 04/14/2020  . History of stroke 01/09/2016  . Acute blood loss anemia   . BRBPR (bright red blood per rectum) 09/30/2014  . DM (diabetes mellitus), type 2 with neurological complications ([place]) 48/32/3468  . Diabetic retinopathy ([place]) 03/16/2013  . H/o Stroke /--history of chronic microhemorrhages 01/24/2011  . Coronary atherosclerosis of native coronary artery 12/13/2010  . Mixed hyperlipidemia 11/05/2010  . Essential hypertension, benign 11/05/2010  . GERD 11/05/2010   1:55 PM, 09/28/20 Mearl Latin PT, DPT Physical Therapist at [place], [place], [address] Phone: [phone]   Fax:  [phone]  Name: [name] MRN: [id number] Date of Birth: [birth year]

## 2020-10-09 DIAGNOSIS — N10 Acute tubulo-interstitial nephritis: Secondary | ICD-10-CM | POA: Diagnosis not present

## 2020-10-09 DIAGNOSIS — N390 Urinary tract infection, site not specified: Secondary | ICD-10-CM | POA: Diagnosis not present

## 2020-10-11 ENCOUNTER — Other Ambulatory Visit: Payer: Self-pay

## 2020-10-11 ENCOUNTER — Ambulatory Visit
Admission: RE | Admit: 2020-10-11 | Discharge: 2020-10-11 | Disposition: A | Discharge status: Home / Self Care | Payer: PPO | Source: Ambulatory Visit | Attending: Family Medicine | Admitting: Family Medicine

## 2020-10-11 VITALS — BP 162/68 | HR 59 | Temp 98.0°F | Resp 18

## 2020-10-11 DIAGNOSIS — B029 Zoster without complications: Secondary | ICD-10-CM | POA: Diagnosis not present

## 2020-10-11 MED ORDER — DEXAMETHASONE SODIUM PHOSPHATE 10 MG/ML IJ SOLN
10.0000 mg | Freq: Once | INTRAMUSCULAR | Status: AC
  Administered 2020-10-11: 10 mg via INTRAMUSCULAR

## 2020-10-11 MED ORDER — TRIAMCINOLONE ACETONIDE 0.1 % EX CREA: 1.0000 "application " | TOPICAL_CREAM | Freq: Two times a day (BID) | CUTANEOUS | 0 refills | Status: DC

## 2020-10-11 NOTE — Discharge Instructions (Addendum)
You have received a steroid injection in the office today to help with your rash  I have sent in triamcinolone cream for you to use to the area twice a day as needed.  Follow up with this office or with primary care if symptoms are persisting.  Follow up in the ER for high fever, trouble swallowing, trouble breathing, other concerning symptoms.

## 2020-10-11 NOTE — ED Provider Notes (Signed)
[place]   [id number] 10/11/20 Arrival Time: 1201  CC: RASH  SUBJECTIVE:  [name] is a 87 y.o. female who presents with a skin complaint that began 2-3 weeks ago. Reports that she has had shingles in the past. Reports that the area is red, raised, burning at times and very itchy. Denies precipitating event or trauma. Denies changes in soaps, detergents, close contacts with similar rash, known trigger or environmental trigger, allergy. Denies medications change or starting a new medication recently. Localizes the rash to left low back, left flank and left abdomen. There are no aggravating or alleviating factors. Denies similar symptoms in the past. Denies fever, chills, nausea, vomiting,  discharge, oral lesions, SOB, chest pain, abdominal pain, changes in bowel or bladder function.    ROS: As per HPI.  All other pertinent ROS negative.     Past Medical History:  Diagnosis Date  . Allergic rhinitis   . Altered mental status    Per records at [facility]   . Cataracts, bilateral   . Cerebral infarct [facility])    Per records at [facility]   . Coronary atherosclerosis of native coronary artery    DES LAD 3/12, 99% nondominant RCA, No CAD otherwise, LVEF 65%  . Coronary atherosclerosis of native coronary artery    Per records at [facility]   . Diabetic retinopathy ([place])   . Disorientation, unspecified    Per records at [facility]   . Diverticulitis   . Essential hypertension   . GERD (gastroesophageal reflux disease)   . Hemorrhoids   . Hyperlipidemia   . Major depressive disorder with single episode    Per records at [facility]   . Metabolic encephalopathy    Per records at [facility]   . Muscle weakness (generalized)    Per records at [facility]   . Stroke, hemorrhagic ([place])    Right thalamic hemorrhage 4/12  . Syncope    Neurally mediated  . Syncope and collapse    Per records at [facility]   . Type 2 diabetes mellitus ([place])   . Type 2 diabetes mellitus with unspecified diabetic retinopathy without macular edema ([place])    Per records at [facility]   . Unspecified dementia without behavioral disturbance ([place])    Per records at [facility]    Past Surgical History:  Procedure Laterality Date  . BREAST CYST EXCISION    . CARDIAC CATHETERIZATION    . CAROTID STENT  11/16/10  . COLONOSCOPY N/A 10/02/2014   Dr. Rourk:engorged internal hemorrhoids/pancolonic diverticulosis  . ESOPHAGOGASTRODUODENOSCOPY  10/02/14   Dr. Rourk:normal esophagus 2 cm hiatal hernia, gastric erosions likely from NSAID effect   Allergies  Allergen Reactions  . Macrodantin   . Pravachol [Pravastatin Sodium]   . Tetanus Toxoids    No current facility-administered medications on file prior to encounter.   Current Outpatient Medications on File Prior to Encounter  Medication Sig Dispense Refill  . acetaminophen (TYLENOL) 325 MG tablet Take 2 tablets (650 mg total) by mouth every 4 (four) hours as needed for mild pain, fever or headache (or Fever >/= 101). 12 tablet 1  . amLODipine (NORVASC) 2.5 MG tablet Take 1 tablet (2.5 mg total) by mouth daily. 30 tablet 0  . aspirin 81 MG tablet Take 1 tablet (81 mg total) by mouth daily with breakfast. 30 tablet 5  . donepezil (ARICEPT) 10 MG tablet Take  1 tablet (10 mg total) by mouth at bedtime. Start 1/2 tablet daily x 4 weeks and then 1 tablet daily 30 tablet 3  . escitalopram (LEXAPRO) 20 MG tablet Take 1.5 tablets (30 mg total) by mouth daily. 45 tablet 0  . ezetimibe (ZETIA) 10 MG tablet Take 1 tablet (10 mg total) by mouth daily. 30 tablet 0  . fluvastatin (LESCOL) 40 MG capsule Take 1 capsule (40 mg total) by mouth daily. 30 capsule 0  . Melatonin 10 MG TABS Take 10 mg by mouth daily after supper. 1 tablet 1  . metoprolol succinate (TOPROL-XL) 25 MG 24 hr tablet TAKE (1) TABLET BY MOUTH AT BEDTIME. NEEDS OFFICE VISIT 30 tablet 0  .  naphazoline-glycerin (CLEAR EYES REDNESS) 0.012-0.2 % SOLN Place 2 drops into both eyes 4 (four) times daily as needed for eye irritation. 15 mL 1  . nitroGLYCERIN (NITROSTAT) 0.4 MG SL tablet Dissolve 1 tablet under tongue every 5 mins up to 3 dose in 15 mins for chest pain. If no relief call 911. 25 tablet 0  . ondansetron (ZOFRAN-ODT) 4 MG disintegrating tablet Take 1 tablet (4 mg total) by mouth every 6 (six) hours as needed for nausea or vomiting. 10 tablet 0  . polyethylene glycol (MIRALAX / GLYCOLAX) 17 g packet Take 17 g by mouth daily. 30 each 2  . ramipril (ALTACE) 5 MG capsule Take 1 capsule (5 mg total) by mouth daily. 30 capsule 0  . UNABLE TO FIND Diet: NAS     Social History   Socioeconomic History  . Marital status: Married    Spouse name: Zenia Resides  . Number of children: 2  . Years of education: 82  . Highest education level: Not on file  Occupational History  . Occupation: Retired  Tobacco Use  . Smoking status: Former Smoker    Packs/day: 0.25    Types: Cigarettes    Quit date: 09/02/1988    Years since quitting: 32.1  . Smokeless tobacco: Never Used  Vaping Use  . Vaping Use: Never used  Substance and Sexual Activity  . Alcohol use: No    Alcohol/week: 0.0 standard drinks  . Drug use: No  . Sexual activity: Not Currently  Other Topics Concern  . Not on file  Social History Narrative   Lives with husband   Right handed   Caffeine use - coffee x 2 cups   Social Determinants of Health   Financial Resource Strain: Not on file  Food Insecurity: Not on file  Transportation Needs: Not on file  Physical Activity: Not on file  Stress: Not on file  Social Connections: Not on file  Intimate Partner Violence: Not on file   Family History  Problem Relation Age of Onset  . Pancreatic cancer Mother        Died at age 33  . Diabetes Mother   . Cancer Mother   . Coronary artery disease Father        Died in his 54s  . Heart attack Father   . Hypertension Father    . Breast cancer Sister   . Cancer Sister     OBJECTIVE: Vitals:   10/11/20 1233  BP: (!) 162/68  Pulse: (!) 59  Resp: 18  Temp: 98 F (36.7 C)  SpO2: 94%    General appearance: alert; no distress Head: NCAT Lungs: clear to auscultation bilaterally Heart: regular rate and rhythm.  Radial pulse 2+ bilaterally Extremities: no edema Skin: warm and dry; erythematous vesicular  rash to left low back, left flank, and left abdomen, consistent with herpes zoster Psychological: alert and cooperative; normal mood and affect  ASSESSMENT & PLAN:  1. Herpes zoster without complication     Meds ordered this encounter  Medications  . dexamethasone (DECADRON) injection 10 mg  . triamcinolone (KENALOG) 0.1 %    Sig: Apply 1 application topically 2 (two) times daily.    Dispense:  30 g    Refill:  0    Order Specific Question:   Supervising Provider    Answer:   Chase Picket [[id number]]   Decadron 10mg  IM in office today Triamcinolone prescribed Take as prescribed and to completion Avoid hot showers/ baths Moisturize skin daily  Follow up with PCP if symptoms persists Return or go to the ER if you have any new or worsening symptoms such as fever, chills, nausea, vomiting, redness, swelling, discharge, if symptoms do not improve with medications  Reviewed expectations re: course of current medical issues. Questions answered. Outlined signs and symptoms indicating need for more acute intervention. Patient verbalized understanding. After Visit Summary given.   Faustino Congress, NP 10/11/20 1305

## 2020-10-11 NOTE — ED Triage Notes (Signed)
Rash present for past 2-3 weeks

## 2020-10-11 NOTE — ED Triage Notes (Signed)
Pt has rash on left flank and left abdomen

## 2020-10-16 ENCOUNTER — Other Ambulatory Visit: Payer: Self-pay | Admitting: Family Medicine

## 2020-10-16 ENCOUNTER — Ambulatory Visit (INDEPENDENT_AMBULATORY_CARE_PROVIDER_SITE_OTHER): Payer: PPO | Admitting: Family Medicine

## 2020-10-16 ENCOUNTER — Telehealth: Payer: Self-pay

## 2020-10-16 ENCOUNTER — Ambulatory Visit: Payer: PPO | Admitting: Family Medicine

## 2020-10-16 ENCOUNTER — Other Ambulatory Visit: Payer: Self-pay

## 2020-10-16 ENCOUNTER — Other Ambulatory Visit: Payer: Self-pay | Admitting: Adult Health

## 2020-10-16 ENCOUNTER — Encounter: Payer: Self-pay | Admitting: Family Medicine

## 2020-10-16 VITALS — BP 122/68 | HR 66 | Temp 95.4°F | Ht 62.0 in | Wt 138.4 lb

## 2020-10-16 DIAGNOSIS — B029 Zoster without complications: Secondary | ICD-10-CM | POA: Diagnosis not present

## 2020-10-16 DIAGNOSIS — R509 Fever, unspecified: Secondary | ICD-10-CM | POA: Diagnosis not present

## 2020-10-16 DIAGNOSIS — R21 Rash and other nonspecific skin eruption: Secondary | ICD-10-CM | POA: Diagnosis not present

## 2020-10-16 DIAGNOSIS — N3001 Acute cystitis with hematuria: Secondary | ICD-10-CM | POA: Diagnosis not present

## 2020-10-16 LAB — POCT URINALYSIS DIPSTICK
Protein, UA: POSITIVE — AB
Spec Grav, UA: >=1.03 — AB (ref 1.010–1.025)
pH, UA: 5 (ref 5.0–8.0)

## 2020-10-16 NOTE — Telephone Encounter (Signed)
Please advise. Thank you

## 2020-10-16 NOTE — Telephone Encounter (Signed)
Patient seen by Santiago Glad this morning and has Hospital f/u in March (not sure why scheduled so far out?).  Son said they forgot to ask for med refills this morning.  Last refill was done while she was at the [facility] center for rehab.  Husband on his way with urine sample that she couldn't do this morning.  DPR on file for Son.  fluvastatin (LESCOL) 40 MG capsule   ramipril (ALTACE) 5 MG capsule    [employer]

## 2020-10-16 NOTE — Progress Notes (Signed)
Pt here for UTI and shingles follow up. Pt was taken to Urgent Care on [address] on Monday; dx with UTI. Pt had small amount of blood in urine at Urgent Care. Pt unable to void at this time.  [facility] [address] 10/11/20; dx with shingles. Pt has shingles on left side of back and stomach. Pt was given a shot at [facility].     Patient ID: [name], female    DOB: 05-03-1934, 87 y.o.   MRN: [id number]   Chief Complaint  Patient presents with   Urinary Tract Infection   Subjective:  CC: follow-up for UTI and herpes zoster (both diagnosed at [facility])   This is a new problem. Went to UC last Monday for UTI- treated with Cipro and Augmentin-stopped both medications due to excessive diarrhea. Seen on 2 9, diagnosed with herpes zoster, given dexamethasone IM at that time.  Reports area on left back, left abdominal area still burning and painful from the shingles outbreak.  Denies any dysuria, any fever any chills.  Was unable to give a urine sample at the urgent care when UTI was diagnosed.  Will attempt to get urine sample today.    Soham has a past medical history of Allergic rhinitis, Altered mental status, Cataracts, bilateral, Cerebral infarct ([place]), Coronary atherosclerosis of native coronary artery, Coronary atherosclerosis of native coronary artery, Diabetic retinopathy ([place]), Disorientation, unspecified, Diverticulitis, Essential hypertension, GERD (gastroesophageal reflux disease), Hemorrhoids, Hyperlipidemia, Major depressive disorder with single episode, Metabolic encephalopathy, Muscle weakness (generalized), Stroke, hemorrhagic ([place]), Syncope, Syncope and collapse, Type 2 diabetes mellitus ([place]), Type 2 diabetes mellitus with unspecified diabetic retinopathy without macular edema ([place]), and Unspecified dementia without behavioral disturbance ([place]).   Outpatient Encounter Medications as of 10/16/2020  Medication Sig   acetaminophen (TYLENOL) 325 MG  tablet Take 2 tablets (650 mg total) by mouth every 4 (four) hours as needed for mild pain, fever or headache (or Fever >/= 101).   amLODipine (NORVASC) 2.5 MG tablet Take 1 tablet (2.5 mg total) by mouth daily.   aspirin 81 MG tablet Take 1 tablet (81 mg total) by mouth daily with breakfast.   donepezil (ARICEPT) 10 MG tablet Take 1 tablet (10 mg total) by mouth at bedtime. Start 1/2 tablet daily x 4 weeks and then 1 tablet daily   escitalopram (LEXAPRO) 20 MG tablet Take 1.5 tablets (30 mg total) by mouth daily.   ezetimibe (ZETIA) 10 MG tablet Take 1 tablet (10 mg total) by mouth daily.   fluvastatin (LESCOL) 40 MG capsule Take 1 capsule (40 mg total) by mouth daily.   Melatonin 10 MG TABS Take 10 mg by mouth daily after supper.   metoprolol succinate (TOPROL-XL) 25 MG 24 hr tablet TAKE (1) TABLET BY MOUTH AT BEDTIME. NEEDS OFFICE VISIT   naphazoline-glycerin (CLEAR EYES REDNESS) 0.012-0.2 % SOLN Place 2 drops into both eyes 4 (four) times daily as needed for eye irritation.   nitroGLYCERIN (NITROSTAT) 0.4 MG SL tablet Dissolve 1 tablet under tongue every 5 mins up to 3 dose in 15 mins for chest pain. If no relief call 911.   ondansetron (ZOFRAN-ODT) 4 MG disintegrating tablet Take 1 tablet (4 mg total) by mouth every 6 (six) hours as needed for nausea or vomiting.   polyethylene glycol (MIRALAX / GLYCOLAX) 17 g packet Take 17 g by mouth daily.   ramipril (ALTACE) 5 MG capsule Take 1 capsule (5 mg total) by mouth daily.   triamcinolone (KENALOG)  0.1 % Apply 1 application topically 2 (two) times daily.   UNABLE TO FIND Diet: NAS   No facility-administered encounter medications on file as of 10/16/2020.     Review of Systems  Constitutional: Positive for fever. Negative for chills.       Fever last week- Monday or Tuesday. None recently.   Respiratory: Negative for chest tightness and shortness of breath.   Cardiovascular: Negative for chest pain.  Genitourinary: Positive for  hematuria. Negative for dysuria and frequency.       Per UC saw blood in urine  Musculoskeletal: Positive for back pain.       Has herpes zoster outbreak on left side of back and left side abdomen.   Skin: Positive for rash.       Herpes zoster     Vitals BP 122/68    Pulse 66    Temp (!) 95.4 F (35.2 C)    Ht 5\' 2"  (1.575 m)    Wt 138 lb 6.4 oz (62.8 kg)    SpO2 96%    BMI 25.31 kg/m   Objective:   Physical Exam Vitals reviewed.  Constitutional:      Appearance: Normal appearance.  Cardiovascular:     Rate and Rhythm: Normal rate and regular rhythm.     Heart sounds: Normal heart sounds.  Pulmonary:     Effort: Pulmonary effort is normal.     Breath sounds: Normal breath sounds.  Skin:    General: Skin is warm and dry.     Findings: Erythema and lesion present.     Comments: Erythematous linear lesions noted approximately dermatome 8-9.  Continues to be painful, and burning.   have been present for greater than 1 week.  Neurological:     General: No focal deficit present.     Mental Status: She is alert.  Psychiatric:        Behavior: Behavior normal.    Results for orders placed or performed in visit on 10/16/20  POCT Urinalysis Dipstick  Result Value Ref Range   Color, UA     Clarity, UA     Glucose, UA     Bilirubin, UA     Ketones, UA     Spec Grav, UA >=1.030 (A) 1.010 - 1.025   Blood, UA     pH, UA 5.0 5.0 - 8.0   Protein, UA Positive (A) Negative   Urobilinogen, UA     Nitrite, UA     Leukocytes, UA     Appearance     Odor       Assessment and Plan   1. Herpes zoster without complication  2. Acute cystitis with hematuria   Unable to give urine sample today in the office.  Family member reports that there was hematuria in urine at the urgent care last week.  Cup given to patient so that she could return some urine later today for evaluation.  Ideally we can recheck the urine to ensure resolution of hematuria.  Unable to complete either of the  antibiotics due to diarrhea.  Was initially started on Cipro, stopped then started on Augmentin.  Reports diarrhea with both of these and has not completed either of the courses.  Herpes zoster: Symptoms have been present greater than 72 hours, unable to start antiviral medications.  She has received steroid injection on February 9.  Recommend topical treatment with lidocaine patches OTC, instructed to have on for 12 hours off for 24 hours.  Update:  Urine sample returned, negative for infection negative for blood.  Positive protein, last GFR 52 in November 2021.  Agrees with plan of care discussed today. Understands warning signs to seek further care: chest pain, shortness of breath, any significant change in health.  Understands to follow-up later today with urine sample.  No treatment recommended today.    Chalmers Guest, NP 10/16/2020

## 2020-10-16 NOTE — Telephone Encounter (Signed)
Please contact patient and move her 11/07/20 hospital follow up to a sooner date and make it a medication check. Can be with Santiago Glad or Dr.Taylor. thank you!

## 2020-10-16 NOTE — Telephone Encounter (Signed)
Will need to schedule another visit to focus on hospital follow-up and med refills. Did not address that at all this morning. We only talked about UTI and shingles. Santiago Glad

## 2020-10-18 ENCOUNTER — Ambulatory Visit (INDEPENDENT_AMBULATORY_CARE_PROVIDER_SITE_OTHER): Payer: PPO | Admitting: Family Medicine

## 2020-10-18 ENCOUNTER — Telehealth: Payer: Self-pay

## 2020-10-18 ENCOUNTER — Encounter: Payer: Self-pay | Admitting: Family Medicine

## 2020-10-18 ENCOUNTER — Other Ambulatory Visit: Payer: Self-pay

## 2020-10-18 VITALS — BP 145/72 | HR 60 | Temp 98.2°F | Ht 62.0 in | Wt 138.0 lb

## 2020-10-18 DIAGNOSIS — F0150 Vascular dementia without behavioral disturbance: Secondary | ICD-10-CM

## 2020-10-18 DIAGNOSIS — I10 Essential (primary) hypertension: Secondary | ICD-10-CM

## 2020-10-18 DIAGNOSIS — G9341 Metabolic encephalopathy: Secondary | ICD-10-CM

## 2020-10-18 DIAGNOSIS — B029 Zoster without complications: Secondary | ICD-10-CM | POA: Diagnosis not present

## 2020-10-18 DIAGNOSIS — R197 Diarrhea, unspecified: Secondary | ICD-10-CM

## 2020-10-18 LAB — URINE CULTURE

## 2020-10-18 MED ORDER — RAMIPRIL 5 MG PO CAPS: 5.0000 mg | ORAL_CAPSULE | Freq: Every day | ORAL | 0 refills | Status: AC

## 2020-10-18 NOTE — Progress Notes (Signed)
Patient ID: [name], female    DOB: Nov 20, 1933, 87 y.o.   MRN: [id number]   Chief Complaint  Patient presents with  . hospital discharge  . Diarrhea  . metabolic encephalopathy   Subjective:    HPI  pt arrives with daughter [name].   hospitalization follow up.- metabolic encephalopathy, acute confusion/AMS.   Nurse note- Occasionally family gives her a xanax. Family would like to know if they should keep giving benadryl for sleep. Also taking melatonin.  Went to urgent care last week for shingles. Rash on left side. Would like to discuss shingles vaccine.   Went to see Dr. Leonie Proctor- neuro.  They did EEG and normal. Per daughter.   Had diarrhea last few days, was on abx last week. Checked urine again and negative.  Wondering if eating peanuts and might acted up.  Quit giving it to her at night.  Unless can't calm or wind down. Hard to get to sleep.   Not on medication for urgency.  recommending discontinuing xanax.  Has shingles on left abdomen. Small on left flank. Gave triamcinolone and deacron injection. Pt lidocaine patches. 5/10.  Hasn't had tylenol or cream on it today.  Recent diarrhea in past week.  Left lower abd pain with h/o diverticulitis in past. Pt was on cipro, but hard to take due to large pills, then changed to Augmentin, and crushed and put in food.  Then just stopped the antibiotics since was negative for uti, last week.     [name] has a past medical history of Allergic rhinitis, Altered mental status, Cataracts, bilateral, Cerebral infarct ([name]), Coronary atherosclerosis of native coronary artery, Coronary atherosclerosis of native coronary artery, Diabetic retinopathy ([name]), Disorientation, unspecified, Diverticulitis, Essential hypertension, GERD (gastroesophageal reflux disease), Hemorrhoids, Hyperlipidemia, Major depressive disorder with single episode, Metabolic encephalopathy, Muscle weakness (generalized),  Stroke, hemorrhagic ([name]), Syncope, Syncope and collapse, Type 2 diabetes mellitus ([name]), Type 2 diabetes mellitus with unspecified diabetic retinopathy without macular edema ([name]), and Unspecified dementia without behavioral disturbance ([name]).   Outpatient Encounter Medications as of 10/18/2020  Medication Sig  . acetaminophen (TYLENOL) 325 MG tablet Take 2 tablets (650 mg total) by mouth every 4 (four) hours as needed for mild pain, fever or headache (or Fever >/= 101).  Marland Kitchen amLODipine (NORVASC) 2.5 MG tablet Take 1 tablet (2.5 mg total) by mouth daily.  Marland Kitchen aspirin 81 MG tablet Take 1 tablet (81 mg total) by mouth daily with breakfast.  . donepezil (ARICEPT) 10 MG tablet Take 1 tablet (10 mg total) by mouth at bedtime. Start 1/2 tablet daily x 4 weeks and then 1 tablet daily  . ezetimibe (ZETIA) 10 MG tablet Take 1 tablet (10 mg total) by mouth daily.  . fluvastatin (LESCOL) 40 MG capsule TAKE 1 CAPSULE BY MOUTH DAILY.  . Melatonin 10 MG TABS Take 10 mg by mouth daily after supper.  . naphazoline-glycerin (CLEAR EYES REDNESS) 0.012-0.2 % SOLN Place 2 drops into both eyes 4 (four) times daily as needed for eye irritation.  . Naproxen Sodium (ALEVE PO) Take by mouth.  . nitroGLYCERIN (NITROSTAT) 0.4 MG SL tablet Dissolve 1 tablet under tongue every 5 mins up to 3 dose in 15 mins for chest pain. If no relief call 911.  . triamcinolone (KENALOG) 0.1 % Apply 1 application topically 2 (two) times daily.  . [DISCONTINUED] ALPRAZolam (XANAX) 0.25 MG tablet Take 0.25 mg by mouth at bedtime as needed.  . [DISCONTINUED] escitalopram (LEXAPRO) 20 MG tablet Take 1.5  tablets (30 mg total) by mouth daily.  . [DISCONTINUED] metoprolol succinate (TOPROL-XL) 25 MG 24 hr tablet TAKE (1) TABLET BY MOUTH AT BEDTIME. NEEDS OFFICE VISIT  . [DISCONTINUED] ramipril (ALTACE) 5 MG capsule Take 1 capsule (5 mg total) by mouth daily.  . polyethylene glycol (MIRALAX / GLYCOLAX) 17 g packet Take 17 g by mouth daily.  . ramipril  (ALTACE) 5 MG capsule Take 1 capsule (5 mg total) by mouth daily.  Marland Kitchen UNABLE TO FIND Diet: NAS  . [DISCONTINUED] ondansetron (ZOFRAN-ODT) 4 MG disintegrating tablet Take 1 tablet (4 mg total) by mouth every 6 (six) hours as needed for nausea or vomiting. (Patient not taking: Reported on 10/18/2020)   No facility-administered encounter medications on file as of 10/18/2020.     Review of Systems  Constitutional: Negative for chills and fever.  HENT: Negative for congestion, rhinorrhea and sore throat.   Respiratory: Negative for cough, shortness of breath and wheezing.   Cardiovascular: Negative for chest pain and leg swelling.  Gastrointestinal: Positive for abdominal pain and diarrhea. Negative for nausea and vomiting.  Genitourinary: Negative for dysuria and frequency.  Musculoskeletal: Negative for arthralgias and back pain.  Skin: Positive for rash.  Neurological: Negative for dizziness, weakness and headaches.  Psychiatric/Behavioral: Positive for sleep disturbance.     Vitals BP (!) 145/72   Pulse 60   Temp 98.2 F (36.8 C)   Ht 5\' 2"  (1.575 m)   Wt 138 lb (62.6 kg)   SpO2 96%   BMI 25.24 kg/m   Objective:   Physical Exam Vitals and nursing note reviewed.  Constitutional:      Appearance: Normal appearance.  HENT:     Head: Normocephalic and atraumatic.     Nose: Nose normal.     Mouth/Throat:     Mouth: Mucous membranes are moist.     Pharynx: Oropharynx is clear.  Eyes:     Extraocular Movements: Extraocular movements intact.     Conjunctiva/sclera: Conjunctivae normal.     Pupils: Pupils are equal, round, and reactive to light.  Cardiovascular:     Rate and Rhythm: Normal rate and regular rhythm.     Pulses: Normal pulses.     Heart sounds: Normal heart sounds.  Pulmonary:     Effort: Pulmonary effort is normal.     Breath sounds: Normal breath sounds. No wheezing, rhonchi or rales.  Abdominal:     General: Bowel sounds are normal. There is no distension.      Palpations: Abdomen is soft. There is no mass.     Tenderness: There is no abdominal tenderness. There is no guarding.     Hernia: No hernia is present.     Comments: Erythema to left abdomen. No blisters.  Musculoskeletal:        General: Normal range of motion.     Right lower leg: No edema.     Left lower leg: No edema.  Skin:    General: Skin is warm and dry.     Findings: No lesion or rash.  Neurological:     General: No focal deficit present.     Mental Status: She is alert.     Comments: Oriented to person.  Psychiatric:        Mood and Affect: Mood normal.        Behavior: Behavior normal.    Assessment and Plan   1. Acute metabolic encephalopathy  2. Herpes zoster without complication  3. Essential hypertension, benign  4.  Vascular dementia without behavioral disturbance ([place])  5. Diarrhea, unspecified type   Anxiety- Stopped xanax due to concerns of confusion and hospitalization recently. Pt getting up a lot at night to urinate.   Insomnia- Take benadryl sparingly. Warned pt of increase in tolerance and concern of urinary retention in pt over 36 yrs old.  Take melatonin for sleep.  Herpes zoster- cont medications till finished. Use lidocaine patch for rash.   Antibiotics stopped since pt not having uti. Cont to watch the diarrhea and lower abd pain, if worsening call or rto. No fever.   F/u 7mo or prn.

## 2020-10-18 NOTE — Patient Instructions (Signed)
Can take 500mg  tylenol every 6hrs.  Use lidocaine patch for rash.  Multivitamin is okay to take.  Try to reduce water intake after 7pm and no caffeine after 12 pm.

## 2020-10-18 NOTE — Telephone Encounter (Signed)
Just keep ramipril 1x per day for now and we will recheck it in June appt.   Thx.   Dr. Lovena Le

## 2020-10-18 NOTE — Telephone Encounter (Signed)
Son calling just to make sure rx for Ramipril is correct.  She had always taken it twice a day until [facility] center changed it to once a day.  He wants to make sure that is how Dr. Lovena Le wants her to take it.  Should it be once or twice daily? Son on [place] and said it was fine to leave the message on his cell phone about it.

## 2020-10-19 NOTE — Telephone Encounter (Signed)
Left message to return call 

## 2020-10-20 NOTE — Telephone Encounter (Signed)
Discussed with pt's son and he verbalized understanding.

## 2020-10-25 ENCOUNTER — Other Ambulatory Visit: Payer: Self-pay | Admitting: Adult Health

## 2020-10-25 DIAGNOSIS — F32A Depression, unspecified: Secondary | ICD-10-CM

## 2020-10-30 ENCOUNTER — Telehealth: Payer: Self-pay

## 2020-10-30 ENCOUNTER — Other Ambulatory Visit: Payer: Self-pay | Admitting: Adult Health

## 2020-10-30 DIAGNOSIS — F32A Depression, unspecified: Secondary | ICD-10-CM

## 2020-10-30 MED ORDER — ESCITALOPRAM OXALATE 20 MG PO TABS: 30.0000 mg | ORAL_TABLET | Freq: Every day | ORAL | 3 refills | Status: AC

## 2020-10-30 MED ORDER — METOPROLOL SUCCINATE ER 25 MG PO TB24: ORAL_TABLET | ORAL | 1 refills | Status: AC

## 2020-10-30 NOTE — Telephone Encounter (Signed)
Seen 2/16. Refill request for lexapro on 2/23 was sent to another provider and denied with a comment  - no longer under provider care. And also requesting refill on metoprolol.

## 2020-10-30 NOTE — Telephone Encounter (Signed)
[name] called on the 23rd to have escitalopram (LEXAPRO) 20 MG tablet sent to [place], [place] pt is out of medication?   metoprolol succinate (TOPROL-XL) 25 MG 24 hr tablet  Also needs refill just calling this one in today    [place] call back [phone]

## 2020-10-30 NOTE — Telephone Encounter (Signed)
Son [name] notified.

## 2020-11-01 ENCOUNTER — Ambulatory Visit (INDEPENDENT_AMBULATORY_CARE_PROVIDER_SITE_OTHER): Payer: PPO

## 2020-11-01 ENCOUNTER — Ambulatory Visit
Admission: RE | Admit: 2020-11-01 | Discharge: 2020-11-01 | Disposition: A | Discharge status: Home / Self Care | Payer: PPO | Source: Ambulatory Visit | Attending: Emergency Medicine | Admitting: Emergency Medicine

## 2020-11-01 ENCOUNTER — Other Ambulatory Visit: Payer: Self-pay

## 2020-11-01 VITALS — BP 130/76 | HR 63 | Temp 98.2°F | Resp 18 | Wt 136.0 lb

## 2020-11-01 DIAGNOSIS — R10817 Generalized abdominal tenderness: Secondary | ICD-10-CM | POA: Diagnosis not present

## 2020-11-01 DIAGNOSIS — R140 Abdominal distension (gaseous): Secondary | ICD-10-CM

## 2020-11-01 DIAGNOSIS — R531 Weakness: Secondary | ICD-10-CM | POA: Diagnosis not present

## 2020-11-01 DIAGNOSIS — R197 Diarrhea, unspecified: Secondary | ICD-10-CM | POA: Diagnosis not present

## 2020-11-01 LAB — POCT URINALYSIS DIP (MANUAL ENTRY)
Bilirubin, UA: NEGATIVE
Glucose, UA: NEGATIVE mg/dL
Leukocytes, UA: NEGATIVE
Nitrite, UA: NEGATIVE
Protein Ur, POC: 30 mg/dL — AB
Spec Grav, UA: >=1.03 — AB (ref 1.010–1.025)
Urobilinogen, UA: 0.2 E.U./dL
pH, UA: 5.5 (ref 5.0–8.0)

## 2020-11-01 NOTE — Discharge Instructions (Addendum)
Go to ER for further evaluation .  

## 2020-11-01 NOTE — ED Provider Notes (Addendum)
[place]   [id number] 11/01/20 Arrival Time: 36  CC: ABDOMINAL DISCOMFORT  SUBJECTIVE:  [name] is a 87 y.o. female with history of diverticulitis and recent shingles infection presented to the urgent care with a complaint of fatigue, generalized weakness, diarrhea, abdominal tenderness and and bloating for the past 3 to 4 weeks.  She has take a few dose of Cipro and amoxicillin recently due to UTI.  Has stopped these medications as patient was told urine culture came up negative.  Report 2 lose diarrhea this morning so far.  Has tried OTC medication without relief.  Describes abdominal tenderness as intermittent and achy in character.  Denies alleviating or aggravating factors.  Denies similar symptoms in the past.  Denies fever, chills, appetite changes, weight changes, nausea, vomiting, chest pain, SOB, constipation,  dysuria, difficulty urinating, increased frequency or urgency, flank pain, loss of bowel or bladder function, vaginal discharge, vaginal odor, vaginal bleeding, dyspareunia, pelvic pain.     No LMP recorded. Patient is postmenopausal.  ROS: As per HPI.  All other pertinent ROS negative.     Past Medical History:  Diagnosis Date  . Allergic rhinitis   . Altered mental status    Per records at [facility]   . Cataracts, bilateral   . Cerebral infarct [facility])    Per records at [facility]   . Coronary atherosclerosis of native coronary artery    DES LAD 3/12, 99% nondominant RCA, No CAD otherwise, LVEF 65%  . Coronary atherosclerosis of native coronary artery    Per records at [facility]   . Diabetic retinopathy ([place])   . Disorientation, unspecified    Per records at [facility]   . Diverticulitis   . Essential hypertension   . GERD (gastroesophageal reflux disease)   . Hemorrhoids   . Hyperlipidemia   . Major depressive disorder with single episode    Per records at [facility]   . Metabolic  encephalopathy    Per records at [facility]   . Muscle weakness (generalized)    Per records at [facility]   . Stroke, hemorrhagic ([place])    Right thalamic hemorrhage 4/12  . Syncope    Neurally mediated  . Syncope and collapse    Per records at [facility]   . Type 2 diabetes mellitus ([place])   . Type 2 diabetes mellitus with unspecified diabetic retinopathy without macular edema ([place])    Per records at [facility] [place]   . Unspecified dementia without behavioral disturbance ([place])    Per records at [facility]    Past Surgical History:  Procedure Laterality Date  . BREAST CYST EXCISION    . CARDIAC CATHETERIZATION    . CAROTID STENT  11/16/10  . COLONOSCOPY N/A 10/02/2014   Dr. Rourk:engorged internal hemorrhoids/pancolonic diverticulosis  . ESOPHAGOGASTRODUODENOSCOPY  10/02/14   Dr. Rourk:normal esophagus 2 cm hiatal hernia, gastric erosions likely from NSAID effect   Allergies  Allergen Reactions  . Macrodantin   . Pravachol [Pravastatin Sodium]   . Tetanus Toxoids    No current facility-administered medications on file prior to encounter.   Current Outpatient Medications on File Prior to Encounter  Medication Sig Dispense Refill  . acetaminophen (TYLENOL) 325 MG tablet Take 2 tablets (650 mg total) by mouth every 4 (four) hours as needed for mild pain, fever or headache (or Fever >/= 101). 12 tablet 1  . amLODipine (NORVASC) 2.5 MG  tablet Take 1 tablet (2.5 mg total) by mouth daily. 30 tablet 0  . aspirin 81 MG tablet Take 1 tablet (81 mg total) by mouth daily with breakfast. 30 tablet 5  . donepezil (ARICEPT) 10 MG tablet Take 1 tablet (10 mg total) by mouth at bedtime. Start 1/2 tablet daily x 4 weeks and then 1 tablet daily 30 tablet 3  . escitalopram (LEXAPRO) 20 MG tablet Take 1.5 tablets (30 mg total) by mouth daily. 45 tablet 3  . ezetimibe (ZETIA) 10 MG tablet Take 1 tablet (10 mg total) by mouth daily. 30 tablet 0  . fluvastatin  (LESCOL) 40 MG capsule TAKE 1 CAPSULE BY MOUTH DAILY. 30 capsule 0  . Melatonin 10 MG TABS Take 10 mg by mouth daily after supper. 1 tablet 1  . metoprolol succinate (TOPROL-XL) 25 MG 24 hr tablet TAKE (1) TABLET BY MOUTH AT BEDTIME. NEEDS OFFICE VISIT 90 tablet 1  . naphazoline-glycerin (CLEAR EYES REDNESS) 0.012-0.2 % SOLN Place 2 drops into both eyes 4 (four) times daily as needed for eye irritation. 15 mL 1  . Naproxen Sodium (ALEVE PO) Take by mouth.    . nitroGLYCERIN (NITROSTAT) 0.4 MG SL tablet Dissolve 1 tablet under tongue every 5 mins up to 3 dose in 15 mins for chest pain. If no relief call 911. 25 tablet 0  . polyethylene glycol (MIRALAX / GLYCOLAX) 17 g packet Take 17 g by mouth daily. (Patient not taking: Reported on 10/18/2020) 30 each 2  . ramipril (ALTACE) 5 MG capsule Take 1 capsule (5 mg total) by mouth daily. 90 capsule 0  . triamcinolone (KENALOG) 0.1 % Apply 1 application topically 2 (two) times daily. 30 g 0  . UNABLE TO FIND Diet: NAS     Social History   Socioeconomic History  . Marital status: Married    Spouse name: Zenia Resides  . Number of children: 2  . Years of education: 42  . Highest education level: Not on file  Occupational History  . Occupation: Retired  Tobacco Use  . Smoking status: Former Smoker    Packs/day: 0.25    Types: Cigarettes    Quit date: 09/02/1988    Years since quitting: 32.1  . Smokeless tobacco: Never Used  Vaping Use  . Vaping Use: Never used  Substance and Sexual Activity  . Alcohol use: No    Alcohol/week: 0.0 standard drinks  . Drug use: No  . Sexual activity: Not Currently  Other Topics Concern  . Not on file  Social History Narrative   Lives with husband   Right handed   Caffeine use - coffee x 2 cups   Social Determinants of Health   Financial Resource Strain: Not on file  Food Insecurity: Not on file  Transportation Needs: Not on file  Physical Activity: Not on file  Stress: Not on file  Social Connections: Not on  file  Intimate Partner Violence: Not on file   Family History  Problem Relation Age of Onset  . Pancreatic cancer Mother        Died at age 31  . Diabetes Mother   . Cancer Mother   . Coronary artery disease Father        Died in his 38s  . Heart attack Father   . Hypertension Father   . Breast cancer Sister   . Cancer Sister      OBJECTIVE:  Vitals:   11/01/20 1308  BP: 130/76  Pulse: 63  Resp: 18  Temp: 98.2 F (36.8 C)  TempSrc: Oral  SpO2: 94%  Weight: 136 lb (61.7 kg)    Physical Exam Vitals and nursing note reviewed.  Constitutional:      General: She is not in acute distress.    Appearance: Normal appearance. She is normal weight. She is not ill-appearing, toxic-appearing or diaphoretic.  HENT:     Head: Normocephalic.     Right Ear: Tympanic membrane, ear canal and external ear normal. There is no impacted cerumen.     Left Ear: Tympanic membrane, ear canal and external ear normal. There is no impacted cerumen.  Cardiovascular:     Rate and Rhythm: Normal rate and regular rhythm.     Pulses: Normal pulses.     Heart sounds: Normal heart sounds. No murmur heard. No friction rub. No gallop.   Pulmonary:     Effort: Pulmonary effort is normal. No respiratory distress.     Breath sounds: Normal breath sounds. No stridor. No wheezing, rhonchi or rales.  Chest:     Chest wall: No tenderness.  Abdominal:     General: There is no distension.     Palpations: There is no mass.     Tenderness: There is generalized abdominal tenderness. There is no right CVA tenderness, left CVA tenderness, guarding or rebound.     Hernia: No hernia is present.  Neurological:     Mental Status: She is alert and oriented to person, place, and time.      LABS: Results for orders placed or performed during the hospital encounter of 11/01/20 (from the past 24 hour(s))  POCT urinalysis dipstick     Status: Abnormal   Collection Time: 11/01/20  2:03 PM  Result Value Ref Range    Color, UA straw (A) yellow   Clarity, UA hazy (A) clear   Glucose, UA negative negative mg/dL   Bilirubin, UA negative negative   Ketones, POC UA trace (5) (A) negative mg/dL   Spec Grav, UA >=1.030 (A) 1.010 - 1.025   Blood, UA trace-intact (A) negative   pH, UA 5.5 5.0 - 8.0   Protein Ur, POC =30 (A) negative mg/dL   Urobilinogen, UA 0.2 0.2 or 1.0 E.U./dL   Nitrite, UA Negative Negative   Leukocytes, UA Negative Negative    DIAGNOSTIC STUDIES: DG Abdomen 1 View  Result Date: 11/01/2020 CLINICAL DATA:  Bloating.  Diarrhea. EXAM: ABDOMEN - 1 VIEW COMPARISON:  CT report 06/08/2003. FINDINGS: Soft tissue structures are unremarkable. No bowel distention. Stool noted throughout the colon. Well-circumscribed density noted the left ilium most likely benign bone island. Degenerative changes lumbar spine. IMPRESSION: No acute abnormality. Stool noted throughout the colon. No bowel distention. Electronically Signed   By: Marcello Moores  Register   On: 11/01/2020 14:38    Abdomen x-ray is negative for acute abnormality.  I have reviewed the x-ray myself and the radiologist interpretation.  I am in agreement with the radiologist interpretation.  ASSESSMENT & PLAN:  1. Weakness   2. Diarrhea, unspecified type   3. Bloating   4. Generalized abdominal tenderness, rebound tenderness presence not specified     No orders of the defined types were placed in this encounter.  Patient is stable at discharge.  Urine analysis was negative for UTI.  Abdominal x-ray was negative for acute abnormality.  She was advised to go to the ER for further evaluation to rule out other abdominal disease process including but not limited to diverticulitis   Discharge instructions  Go to  ER for further evaluation    reviewed expectations re: course of current medical issues. Questions answered. Outlined signs and symptoms indicating need for more acute intervention. Patient verbalized understanding. After Visit Summary  given.   Emerson Monte, FNP 11/01/20 1451    Emerson Monte, FNP 11/01/20 1453    Emerson Monte, FNP 11/01/20 1454

## 2020-11-01 NOTE — ED Triage Notes (Addendum)
Pt presents with complaints of bloated stomach and diarrhea x 3-4 weeks. Reports she is getting over shingles. Reports taking a few doses of an antibiotic (cipro and amoxicillin) for possible uti but did not continue them because the culture came back negative. Reports diarrhea started at that time. Pt does have diverticulitis. Reports pain in lower abdomen. Reports diarrhea 5 times a day and is mostly at night. Abdomen is tender to touch and patient is reporting generalized weakness and fatigue x 2 weeks that is worsened in the last 3 days.

## 2020-11-02 ENCOUNTER — Telehealth: Payer: Self-pay | Admitting: Family Medicine

## 2020-11-02 ENCOUNTER — Encounter: Payer: Self-pay | Admitting: Family Medicine

## 2020-11-02 ENCOUNTER — Ambulatory Visit (INDEPENDENT_AMBULATORY_CARE_PROVIDER_SITE_OTHER): Payer: PPO | Admitting: Family Medicine

## 2020-11-02 VITALS — BP 109/64 | HR 63 | Temp 95.3°F | Wt 136.4 lb

## 2020-11-02 DIAGNOSIS — Z8659 Personal history of other mental and behavioral disorders: Secondary | ICD-10-CM | POA: Diagnosis not present

## 2020-11-02 DIAGNOSIS — K921 Melena: Secondary | ICD-10-CM

## 2020-11-02 DIAGNOSIS — R1030 Lower abdominal pain, unspecified: Secondary | ICD-10-CM | POA: Diagnosis not present

## 2020-11-02 DIAGNOSIS — E538 Deficiency of other specified B group vitamins: Secondary | ICD-10-CM | POA: Diagnosis not present

## 2020-11-02 DIAGNOSIS — G4700 Insomnia, unspecified: Secondary | ICD-10-CM

## 2020-11-02 DIAGNOSIS — R1032 Left lower quadrant pain: Secondary | ICD-10-CM | POA: Diagnosis not present

## 2020-11-02 DIAGNOSIS — R197 Diarrhea, unspecified: Secondary | ICD-10-CM | POA: Diagnosis not present

## 2020-11-02 NOTE — Progress Notes (Signed)
Patient ID: [name], female    DOB: 30-Jul-1934, 87 y.o.   MRN: [id number]   Chief Complaint  Patient presents with  . Diarrhea   Subjective:    HPI  Cc- lower abd pain/diarrhea.   When asking [name], what is bringing them in, she stating the pt is saying-  -" I wish I could feel better, I wish someone would help me feel better. I feel tired and weak.  I can't sleep. "  pmh- dementia, dm2, hld, AM, h/o stroke. Seen today with [name].   Pt has been having diarrhea at night time and pt unable to sleep. Lower abdominal pain. Indigestion and weakness. [name] states that patient is back to having head down and legs bent when walking. Also, still has shingles. Went to Urgent Care yesterday and had xray.   Pt has h/o vit b12 deficiency, wondering if she can get a b12 injection.  Walking head down and legs bent when prior to going into hospital and thinking and maybe weakness again.  Was taking abx and caused diarrhea.  Not sure if pt taking meds at night and not anyone around.  Yesterday went to urgent care due to same concerns. Pt also had these sympotms when seen on 10/18/20 for hosp discharge follow up. At that time we were going to monitor and if worse then return.  Since not tolerating abx causing diarrhea and not completely convinced on that visit it was diverticulitis.  Checked urine, negative for uti. Stool in colon from xray.  At time of urgent care recommended they go to ER. [name] didn't want to wait in the er.  And pt hadn't eaten lunch, so they left. Giving pedialyte daily.  Diarrhea- 2 x at night. Not daily having diarrhea. Cousin staying with her at night, brother here or [name] during day. Pt stating has tried pepto, pt not sure, and unable to give much history.  Pain 2/10 on palpation today. Eating, no pain, very small amt. No gi doctor.   Bright red blood in stool 3 days ago. Not normally in stool.  Has had h/o BRBPR in 2016.  Last hb  normal in 12/21- 12.6. At the time of the blood pt was straining to use bathroom and in bathroom a long time.   [place] has a past medical history of Allergic rhinitis, Altered mental status, Cataracts, bilateral, Cerebral infarct ([place]), Coronary atherosclerosis of native coronary artery, Coronary atherosclerosis of native coronary artery, Diabetic retinopathy ([place]), Disorientation, unspecified, Diverticulitis, Essential hypertension, GERD (gastroesophageal reflux disease), Hemorrhoids, Hyperlipidemia, Major depressive disorder with single episode, Metabolic encephalopathy, Muscle weakness (generalized), Stroke, hemorrhagic ([place]), Syncope, Syncope and collapse, Type 2 diabetes mellitus ([place]), Type 2 diabetes mellitus with unspecified diabetic retinopathy without macular edema ([place]), and Unspecified dementia without behavioral disturbance ([place]).   Outpatient Encounter Medications as of 11/02/2020  Medication Sig  . acetaminophen (TYLENOL) 325 MG tablet Take 2 tablets (650 mg total) by mouth every 4 (four) hours as needed for mild pain, fever or headache (or Fever >/= 101).  Marland Kitchen amLODipine (NORVASC) 2.5 MG tablet Take 1 tablet (2.5 mg total) by mouth daily.  Marland Kitchen aspirin 81 MG tablet Take 1 tablet (81 mg total) by mouth daily with breakfast.  . donepezil (ARICEPT) 10 MG tablet Take 1 tablet (10 mg total) by mouth at bedtime. Start 1/2 tablet daily x 4 weeks and then 1 tablet daily  . escitalopram (LEXAPRO) 20 MG tablet Take 1.5 tablets (30 mg total) by  mouth daily.  Marland Kitchen ezetimibe (ZETIA) 10 MG tablet Take 1 tablet (10 mg total) by mouth daily.  . fluvastatin (LESCOL) 40 MG capsule TAKE 1 CAPSULE BY MOUTH DAILY.  . Melatonin 10 MG TABS Take 10 mg by mouth daily after supper.  . metoprolol succinate (TOPROL-XL) 25 MG 24 hr tablet TAKE (1) TABLET BY MOUTH AT BEDTIME. NEEDS OFFICE VISIT  . naphazoline-glycerin (CLEAR EYES REDNESS) 0.012-0.2 % SOLN Place 2 drops into both eyes 4 (four) times daily  as needed for eye irritation.  . Naproxen Sodium (ALEVE PO) Take by mouth.  . nitroGLYCERIN (NITROSTAT) 0.4 MG SL tablet Dissolve 1 tablet under tongue every 5 mins up to 3 dose in 15 mins for chest pain. If no relief call 911.  . polyethylene glycol (MIRALAX / GLYCOLAX) 17 g packet Take 17 g by mouth daily.  . ramipril (ALTACE) 5 MG capsule Take 1 capsule (5 mg total) by mouth daily.  Marland Kitchen triamcinolone (KENALOG) 0.1 % Apply 1 application topically 2 (two) times daily.  Marland Kitchen UNABLE TO FIND Diet: NAS   No facility-administered encounter medications on file as of 11/02/2020.     Review of Systems  Constitutional: Negative for chills and fever.  HENT: Negative for congestion, rhinorrhea and sore throat.   Respiratory: Negative for cough, shortness of breath and wheezing.   Cardiovascular: Negative for chest pain and leg swelling.  Gastrointestinal: Positive for abdominal pain (lower bilaterally), blood in stool and diarrhea. Negative for nausea and vomiting.  Genitourinary: Negative for dysuria and frequency.  Musculoskeletal: Negative for arthralgias and back pain.  Skin: Negative for rash.  Neurological: Positive for weakness. Negative for dizziness and headaches.  Psychiatric/Behavioral: Positive for sleep disturbance. Negative for dysphoric mood. The patient is not nervous/anxious.      Vitals BP 109/64   Pulse 63   Temp (!) 95.3 F (35.2 C)   Wt 136 lb 6.4 oz (61.9 kg)   SpO2 98%   BMI 24.95 kg/m   Objective:   Physical Exam Vitals and nursing note reviewed.  Constitutional:      General: She is not in acute distress.    Appearance: Normal appearance. She is not ill-appearing or toxic-appearing.  HENT:     Head: Normocephalic and atraumatic.     Nose: Nose normal.     Mouth/Throat:     Mouth: Mucous membranes are moist.     Pharynx: Oropharynx is clear.  Eyes:     Extraocular Movements: Extraocular movements intact.     Conjunctiva/sclera: Conjunctivae normal.      Pupils: Pupils are equal, round, and reactive to light.  Cardiovascular:     Rate and Rhythm: Normal rate and regular rhythm.     Pulses: Normal pulses.     Heart sounds: Normal heart sounds.  Pulmonary:     Effort: Pulmonary effort is normal. No respiratory distress.     Breath sounds: Normal breath sounds. No wheezing, rhonchi or rales.  Abdominal:     General: Bowel sounds are normal. There is distension.     Palpations: Abdomen is soft. There is no mass.     Tenderness: There is abdominal tenderness (bilaterally lower abd, mild 2/10 on palp). There is no guarding or rebound.     Hernia: No hernia is present.  Musculoskeletal:        General: Normal range of motion.     Right lower leg: No edema.     Left lower leg: No edema.  Skin:  General: Skin is warm and dry.     Findings: No lesion or rash.  Neurological:     General: No focal deficit present.     Mental Status: She is alert.     Comments: H/o dementia- poor historian  Psychiatric:        Mood and Affect: Mood normal.        Behavior: Behavior normal.      Assessment and Plan   1. Lower abdominal pain - CMP14+EGFR - Lipase - CBC With Differential - CT ABDOMEN PELVIS W CONTRAST; Future - Clostridium Difficile by PCR - Ova and parasite examination - Stool Culture  2. Diarrhea, unspecified type - CT ABDOMEN PELVIS W CONTRAST; Future - Clostridium Difficile by PCR - Ova and parasite examination - Stool Culture - Fecal leukocytes  3. B12 deficiency - B12  4. History of dementia  5. Blood in stool - CBC With Differential  6. Insomnia, unspecified type   Lower abd pain and diarrhea- constant. Seems to be mild at this time on exam.   Not really improved from last visit. Pt in NAD.  Ct abd pelvis ordered with contrast.  Evaluation of lower abd pain, and r/o diverticulitis. Order stool sample. Gave stool kit. c diff, ova parasites, stool wbc, and stool culture.  Labs ordered.   Use pepto prn and  cont to hydrate.  Will call with results.  If not finding much from image or labs, then would refer to GI for further evaluation.  Insomnia- recently discontinued the xanax, this might be why she is having more anxiety and insomnia at night.  Recommending trazodone or melatonin.  [name] to get back with me to see if they want to try one of these.   Return if symptoms worsen or fail to improve.

## 2020-11-02 NOTE — Telephone Encounter (Signed)
Pt daughter contacted and states they would like to try Trazodone for sleep. Please sent to [facility].

## 2020-11-03 LAB — CBC WITH DIFFERENTIAL
Basophils Absolute: 0.1 10*3/uL (ref 0.0–0.2)
Basos: 1 %
EOS (ABSOLUTE): 0.3 10*3/uL (ref 0.0–0.4)
Eos: 5 %
Hematocrit: 38.4 % (ref 34.0–46.6)
Hemoglobin: 12.6 g/dL (ref 11.1–15.9)
Immature Grans (Abs): 0 10*3/uL (ref 0.0–0.1)
Immature Granulocytes: 0 %
Lymphocytes Absolute: 1.6 10*3/uL (ref 0.7–3.1)
Lymphs: 29 %
MCH: 29.8 pg (ref 26.6–33.0)
MCHC: 32.8 g/dL (ref 31.5–35.7)
MCV: 91 fL (ref 79–97)
Monocytes Absolute: 0.6 10*3/uL (ref 0.1–0.9)
Monocytes: 10 %
Neutrophils Absolute: 3.1 10*3/uL (ref 1.4–7.0)
Neutrophils: 55 %
RBC: 4.23 x10E6/uL (ref 3.77–5.28)
RDW: 12.7 % (ref 11.7–15.4)
WBC: 5.6 10*3/uL (ref 3.4–10.8)

## 2020-11-03 LAB — CMP14+EGFR
ALT: 22 IU/L (ref 0–32)
AST: 24 IU/L (ref 0–40)
Albumin/Globulin Ratio: 1.9 (ref 1.2–2.2)
Albumin: 4.3 g/dL (ref 3.6–4.6)
Alkaline Phosphatase: 109 IU/L (ref 44–121)
BUN/Creatinine Ratio: 11 — ABNORMAL LOW (ref 12–28)
BUN: 12 mg/dL (ref 8–27)
Bilirubin Total: 0.4 mg/dL (ref 0.0–1.2)
CO2: 25 mmol/L (ref 20–29)
Calcium: 9.9 mg/dL (ref 8.7–10.3)
Chloride: 97 mmol/L (ref 96–106)
Creatinine, Ser: 1.07 mg/dL — ABNORMAL HIGH (ref 0.57–1.00)
Globulin, Total: 2.3 g/dL (ref 1.5–4.5)
Glucose: 161 mg/dL — ABNORMAL HIGH (ref 65–99)
Potassium: 4.6 mmol/L (ref 3.5–5.2)
Sodium: 137 mmol/L (ref 134–144)
Total Protein: 6.6 g/dL (ref 6.0–8.5)
eGFR: 51 mL/min/{1.73_m2} — ABNORMAL LOW (ref >59)

## 2020-11-03 LAB — URINE CULTURE: Culture: NO GROWTH

## 2020-11-03 LAB — LIPASE: Lipase: 32 U/L (ref 14–85)

## 2020-11-03 LAB — VITAMIN B12: Vitamin B-12: 676 pg/mL (ref 232–1245)

## 2020-11-03 MED ORDER — TRAZODONE HCL 50 MG PO TABS: ORAL_TABLET | ORAL | 0 refills | Status: AC

## 2020-11-03 NOTE — Telephone Encounter (Signed)
Pt's daughter notified.

## 2020-11-06 ENCOUNTER — Other Ambulatory Visit: Payer: Self-pay | Admitting: Family Medicine

## 2020-11-06 ENCOUNTER — Telehealth: Payer: Self-pay

## 2020-11-06 DIAGNOSIS — B029 Zoster without complications: Secondary | ICD-10-CM

## 2020-11-06 NOTE — Telephone Encounter (Signed)
Family checking on status of CT scan, stating mom still having abdominal pain.  They were not sure if Radiology would call to schedule?

## 2020-11-07 ENCOUNTER — Ambulatory Visit: Payer: PPO | Admitting: Family Medicine

## 2020-11-09 ENCOUNTER — Other Ambulatory Visit: Payer: Self-pay | Admitting: Emergency Medicine

## 2020-11-09 ENCOUNTER — Telehealth: Payer: Self-pay | Admitting: Neurology

## 2020-11-09 MED ORDER — DONEPEZIL HCL 10 MG PO TABS: 10.0000 mg | ORAL_TABLET | Freq: Every day | ORAL | 3 refills | Status: DC

## 2020-11-09 NOTE — Telephone Encounter (Signed)
Pt needing a refill on donepezil (ARICEPT) 10 MG tablet to [name] .  Pt's son is asking for a call to clarify the instructions as to how pt should take this medication

## 2020-11-13 ENCOUNTER — Other Ambulatory Visit: Payer: Self-pay | Admitting: Adult Health

## 2020-11-15 ENCOUNTER — Other Ambulatory Visit: Payer: Self-pay | Admitting: Family Medicine

## 2020-11-16 ENCOUNTER — Ambulatory Visit (HOSPITAL_COMMUNITY): Payer: PPO

## 2020-11-17 ENCOUNTER — Telehealth: Payer: Self-pay | Admitting: Family Medicine

## 2020-11-17 MED ORDER — AMLODIPINE BESYLATE 2.5 MG PO TABS: 2.5000 mg | ORAL_TABLET | Freq: Every day | ORAL | 1 refills | Status: AC

## 2020-11-17 NOTE — Telephone Encounter (Signed)
Give 90 day supply with 1 refill. Thx. Dr. Lovena Le

## 2020-11-17 NOTE — Telephone Encounter (Signed)
Refill sent and pt is aware

## 2020-11-17 NOTE — Telephone Encounter (Signed)
Patient is requesting refill on amlodipine 2.5 mg called into [facility] last seen 11/02/2020

## 2020-11-17 NOTE — Telephone Encounter (Signed)
Last seen 11/02/20 for abdominal pain

## 2020-11-27 ENCOUNTER — Other Ambulatory Visit: Payer: Self-pay | Admitting: Family Medicine

## 2020-11-30 ENCOUNTER — Ambulatory Visit (HOSPITAL_COMMUNITY): Payer: PPO

## 2020-12-06 DIAGNOSIS — R197 Diarrhea, unspecified: Secondary | ICD-10-CM | POA: Diagnosis not present

## 2020-12-06 DIAGNOSIS — R1032 Left lower quadrant pain: Secondary | ICD-10-CM | POA: Diagnosis not present

## 2020-12-07 ENCOUNTER — Telehealth: Payer: Self-pay

## 2020-12-07 DIAGNOSIS — E782 Mixed hyperlipidemia: Secondary | ICD-10-CM | POA: Diagnosis not present

## 2020-12-07 DIAGNOSIS — I10 Essential (primary) hypertension: Secondary | ICD-10-CM | POA: Diagnosis not present

## 2020-12-07 DIAGNOSIS — Z955 Presence of coronary angioplasty implant and graft: Secondary | ICD-10-CM | POA: Diagnosis not present

## 2020-12-07 DIAGNOSIS — K5750 Diverticulosis of both small and large intestine without perforation or abscess without bleeding: Secondary | ICD-10-CM | POA: Diagnosis not present

## 2020-12-07 DIAGNOSIS — I639 Cerebral infarction, unspecified: Secondary | ICD-10-CM | POA: Diagnosis not present

## 2020-12-07 DIAGNOSIS — F0390 Unspecified dementia without behavioral disturbance: Secondary | ICD-10-CM | POA: Diagnosis not present

## 2020-12-07 DIAGNOSIS — Z0189 Encounter for other specified special examinations: Secondary | ICD-10-CM | POA: Diagnosis not present

## 2020-12-07 DIAGNOSIS — G4700 Insomnia, unspecified: Secondary | ICD-10-CM | POA: Diagnosis not present

## 2020-12-07 LAB — CLOSTRIDIUM DIFFICILE BY PCR: Toxigenic C. Difficile by PCR: NEGATIVE

## 2020-12-07 LAB — SPECIMEN STATUS REPORT

## 2020-12-08 DIAGNOSIS — H25813 Combined forms of age-related cataract, bilateral: Secondary | ICD-10-CM | POA: Diagnosis not present

## 2020-12-08 LAB — FECAL LEUKOCYTES

## 2020-12-08 LAB — SPECIMEN STATUS REPORT

## 2020-12-08 LAB — OVA AND PARASITE EXAMINATION

## 2020-12-08 NOTE — Telephone Encounter (Signed)
What is that container?  One she used from her home?  Call the pt or caregiver to let them know to get appropriate stool kit from [place] and then to repeat the stool sample. Dr. Lovena Le

## 2020-12-10 LAB — SPECIMEN STATUS REPORT

## 2020-12-11 ENCOUNTER — Other Ambulatory Visit: Payer: Self-pay | Admitting: Family Medicine

## 2020-12-11 NOTE — Telephone Encounter (Signed)
Confirmed with Dr Lovena Le that no further testing is needed at this time, pt daughter will call back if symptoms don't improve.

## 2021-01-06 ENCOUNTER — Other Ambulatory Visit: Payer: Self-pay | Admitting: Family Medicine

## 2021-01-16 ENCOUNTER — Other Ambulatory Visit: Payer: Self-pay | Admitting: Family Medicine

## 2021-02-06 DIAGNOSIS — E782 Mixed hyperlipidemia: Secondary | ICD-10-CM | POA: Diagnosis not present

## 2021-02-06 DIAGNOSIS — Z79899 Other long term (current) drug therapy: Secondary | ICD-10-CM | POA: Diagnosis not present

## 2021-02-06 DIAGNOSIS — I10 Essential (primary) hypertension: Secondary | ICD-10-CM | POA: Diagnosis not present

## 2021-02-08 DIAGNOSIS — G4701 Insomnia due to medical condition: Secondary | ICD-10-CM | POA: Diagnosis not present

## 2021-02-08 DIAGNOSIS — Z8673 Personal history of transient ischemic attack (TIA), and cerebral infarction without residual deficits: Secondary | ICD-10-CM | POA: Diagnosis not present

## 2021-02-08 DIAGNOSIS — E782 Mixed hyperlipidemia: Secondary | ICD-10-CM | POA: Diagnosis not present

## 2021-02-08 DIAGNOSIS — M25561 Pain in right knee: Secondary | ICD-10-CM | POA: Diagnosis not present

## 2021-02-08 DIAGNOSIS — K5750 Diverticulosis of both small and large intestine without perforation or abscess without bleeding: Secondary | ICD-10-CM | POA: Diagnosis not present

## 2021-02-08 DIAGNOSIS — F0390 Unspecified dementia without behavioral disturbance: Secondary | ICD-10-CM | POA: Diagnosis not present

## 2021-02-08 DIAGNOSIS — R944 Abnormal results of kidney function studies: Secondary | ICD-10-CM | POA: Diagnosis not present

## 2021-02-08 DIAGNOSIS — R269 Unspecified abnormalities of gait and mobility: Secondary | ICD-10-CM | POA: Diagnosis not present

## 2021-02-08 DIAGNOSIS — R7303 Prediabetes: Secondary | ICD-10-CM | POA: Diagnosis not present

## 2021-02-08 DIAGNOSIS — Z955 Presence of coronary angioplasty implant and graft: Secondary | ICD-10-CM | POA: Diagnosis not present

## 2021-02-08 DIAGNOSIS — I10 Essential (primary) hypertension: Secondary | ICD-10-CM | POA: Diagnosis not present

## 2021-02-08 DIAGNOSIS — L820 Inflamed seborrheic keratosis: Secondary | ICD-10-CM | POA: Diagnosis not present

## 2021-02-14 ENCOUNTER — Ambulatory Visit: Payer: PPO | Admitting: Family Medicine

## 2021-03-01 ENCOUNTER — Ambulatory Visit (INDEPENDENT_AMBULATORY_CARE_PROVIDER_SITE_OTHER): Payer: PPO | Admitting: Neurology

## 2021-03-01 ENCOUNTER — Encounter: Payer: Self-pay | Admitting: Neurology

## 2021-03-01 VITALS — BP 128/58 | HR 58 | Ht 61.0 in | Wt 139.2 lb

## 2021-03-01 DIAGNOSIS — H02831 Dermatochalasis of right upper eyelid: Secondary | ICD-10-CM | POA: Diagnosis not present

## 2021-03-01 DIAGNOSIS — R413 Other amnesia: Secondary | ICD-10-CM | POA: Diagnosis not present

## 2021-03-01 DIAGNOSIS — F0150 Vascular dementia without behavioral disturbance: Secondary | ICD-10-CM | POA: Diagnosis not present

## 2021-03-01 DIAGNOSIS — H01002 Unspecified blepharitis right lower eyelid: Secondary | ICD-10-CM | POA: Diagnosis not present

## 2021-03-01 DIAGNOSIS — H25813 Combined forms of age-related cataract, bilateral: Secondary | ICD-10-CM | POA: Diagnosis not present

## 2021-03-01 DIAGNOSIS — H01001 Unspecified blepharitis right upper eyelid: Secondary | ICD-10-CM | POA: Diagnosis not present

## 2021-03-01 MED ORDER — MEMANTINE HCL 10 MG PO TABS: 10.0000 mg | ORAL_TABLET | Freq: Two times a day (BID) | ORAL | 3 refills | Status: DC

## 2021-03-01 MED ORDER — MEMANTINE HCL 28 X 5 MG & 21 X 10 MG PO TABS: ORAL_TABLET | ORAL | 12 refills | Status: DC

## 2021-03-01 NOTE — Progress Notes (Signed)
[facility] [address] [place]. [place] [address] [phone]       OFFICE FOLLOW UP VISIT T NOTE  [name] Date of Birth:  Apr 24, 1934 Medical Record Number:  [id number]   Referring MD:  Malena taylor  Reason for Referral: History of stroke  HPI: Initial visit 08/28/2020:[name] is a 87 year old pleasant Caucasian lady with past medical history of right thalamic hemorrhage in Apr 2012, syncope, gastroesophageal reflux disease, hypertension, diverticulitis, diabetes, gait difficulties and mild cognitive impairment.  She is seen today for consultation visit for increasing falls and memory impairment.  History is obtained from the patient and husband as well as daughter who accompany her today.  I have also personally reviewed electronic medical records and prior relevant imaging films in PACS.  Patient has had some balance difficulties ongoing since 2012 following her right thalamic hemorrhage but recently has been falling a lot.  She is unable to pinpoint exact reason for the falls and they occur at variable intervals and circumstances.  She is also noticed some worsening of her cognitive impairment and she gets confused and disoriented at times.  She does not remember to use a cane or a walker which she both has but does not not use consistently.  She states she just forgets to use it.  She was recently admitted to [facility] on 08/01/2020 and found to have some confusion and gait imbalance which was thought to be due to metabolic issues.  She was thought to have low-grade UTI but urine cultures were negative.  MRI scan had shown moderate ventriculomegaly as well as cerebral atrophy raising question of NPH but Dr. Merlene Laughter who saw the patient did not feel patient had clinical history to suggest NPH.  Patient underwent extensive work-up including vitamin B12, TSH which were negative and made gradual improvement.  She has been referred to physical  therapy but she has not yet started outpatient therapy but plans to do so soon.  On Mini-Mental status exam testing today she scored 29/30 with only minor deficits.  Patient has not been on Aricept or Namenda like medications but she was seen by me following the previous hemorrhage and mild cognitive impairment was diagnosed even as far back as in 2012.  There is no family Struve dementia.  She denies any recent headaches, seizures, loss of consciousness or new strokelike symptoms.  The patient has Xanax listed as a as needed medication but states that she has been taking it every night to help her sleep. Update 03/01/2021: Patient returns for follow-up after last visit 6 months ago.  She is accompanied by her son and her daughter.  She has been tolerating Aricept well without any side effects.  He continues to haved ifficulties and poor short-term memory.  Patient has not had any improvement even after aricept which she however seems to be tolerating well without any GI or CNS side effects.  She lives with her husband who also is having some cognitive difficulties.  Patient needs some help with the shower but otherwise is independent and uses a cane and ambulate safely.  She has had no falls.  She does get upset easily at times and is grumpy.  She has not had any violent behaviors, delusions or hallucinations.  She has been started on trazodone at night which helps her sleep well..  She had EEG done on 08/31/2020 which was normal.  She did a lot worse on Mini-Mental status exam today and scored 21/30  compared to last visit when she had scored 29/30 ROS:   14 system review of systems is positive for memory loss, imbalance, falls, confusion, disorientation, irritability,, f and all other systems negative  PMH:  Past Medical History:  Diagnosis Date   Allergic rhinitis    Altered mental status    Per records at [place], bilateral    Cerebral infarct [facility])    Per records at [facility]    Coronary atherosclerosis of native coronary artery    DES LAD 3/12, 99% nondominant RCA, No CAD otherwise, LVEF 65%   Coronary atherosclerosis of native coronary artery    Per records at [facility]    Diabetic retinopathy ([place])    Disorientation, unspecified    Per records at [facility]    Diverticulitis    Essential hypertension    GERD (gastroesophageal reflux disease)    Hemorrhoids    Hyperlipidemia    Major depressive disorder with single episode    Per records at [facility]    Metabolic encephalopathy    Per records at [facility]    Muscle weakness (generalized)    Per records at [facility]    Stroke, hemorrhagic [facility])    Right thalamic hemorrhage 4/12   Syncope    Neurally mediated   Syncope and collapse    Per records at [facility]    Type 2 diabetes mellitus ([place])    Type 2 diabetes mellitus with unspecified diabetic retinopathy without macular edema ([place])    Per records at [facility]    Unspecified dementia without behavioral disturbance ([place])    Per records at [place] History:  Social History   Socioeconomic History   Marital status: Married    Spouse name: Engineer, materials   Number of children: 2   Years of education: 12   Highest education level: Not on file  Occupational History   Occupation: Retired  Tobacco Use   Smoking status: Former    Packs/day: 0.25    Pack years: 0.00    Types: Cigarettes    Quit date: 09/02/1988    Years since quitting: 32.5   Smokeless tobacco: Never  Vaping Use   Vaping Use: Never used  Substance and Sexual Activity   Alcohol use: No    Alcohol/week: 0.0 standard drinks   Drug use: No   Sexual activity: Not Currently  Other Topics Concern   Not on file  Social History Narrative   Lives with husband   Right handed   Caffeine use - coffee x 2 cups   Social Determinants of Health   Financial Resource Strain: Not on file   Food Insecurity: Not on file  Transportation Needs: Not on file  Physical Activity: Not on file  Stress: Not on file  Social Connections: Not on file  Intimate Partner Violence: Not on file    Medications:   Current Outpatient Medications on File Prior to Visit  Medication Sig Dispense Refill   acetaminophen (TYLENOL) 325 MG tablet Take 2 tablets (650 mg total) by mouth every 4 (four) hours as needed for mild pain, fever or headache (or Fever >/= 101). 12 tablet 1   amLODipine (NORVASC) 2.5 MG tablet Take 1 tablet (2.5 mg total) by mouth daily. 90 tablet 1   aspirin 81 MG tablet Take 1 tablet (81 mg total) by mouth daily with breakfast. 30  tablet 5   donepezil (ARICEPT) 10 MG tablet Take 1 tablet (10 mg total) by mouth at bedtime. Take 1 tablet daily 90 tablet 3   escitalopram (LEXAPRO) 20 MG tablet Take 1.5 tablets (30 mg total) by mouth daily. 45 tablet 3   ezetimibe (ZETIA) 10 MG tablet TAKE ONE TABLET BY MOUTH DAILY. 90 tablet 0   fluvastatin (LESCOL) 40 MG capsule TAKE 1 CAPSULE BY MOUTH ONCE DAILY. 90 capsule 1   Melatonin 10 MG TABS Take 10 mg by mouth daily after supper. 1 tablet 1   metoprolol succinate (TOPROL-XL) 25 MG 24 hr tablet TAKE (1) TABLET BY MOUTH AT BEDTIME. NEEDS OFFICE VISIT 90 tablet 1   naphazoline-glycerin (CLEAR EYES REDNESS) 0.012-0.2 % SOLN Place 2 drops into both eyes 4 (four) times daily as needed for eye irritation. 15 mL 1   Naproxen Sodium (ALEVE PO) Take by mouth.     nitroGLYCERIN (NITROSTAT) 0.4 MG SL tablet Dissolve 1 tablet under tongue every 5 mins up to 3 dose in 15 mins for chest pain. If no relief call 911. 25 tablet 0   polyethylene glycol (MIRALAX / GLYCOLAX) 17 g packet Take 17 g by mouth daily. 30 each 2   ramipril (ALTACE) 5 MG capsule Take 1 capsule (5 mg total) by mouth daily. 90 capsule 0   traZODone (DESYREL) 50 MG tablet Take 1 tab for 7 days, then increase to 2 tab p.o. qhs for insomnia. 60 tablet 0   triamcinolone (KENALOG) 0.1 %  APPLY TO AFFECTED AREA TWICE A DAY. 30 g 0   UNABLE TO FIND Diet: NAS     No current facility-administered medications on file prior to visit.    Allergies:   Allergies  Allergen Reactions   Macrodantin    Pravachol [Pravastatin Sodium]    Tetanus Toxoids     Physical Exam General: Frail elderly Caucasian lady, seated, in no evident distress Head: head normocephalic and atraumatic.   Neck: supple with no carotid or supraclavicular bruits Cardiovascular: regular rate and rhythm, no murmurs Musculoskeletal: no deformity Skin:  no rash/petichiae Vascular:  Normal pulses all extremities  Neurologic Exam Mental Status: Awake and fully alert. Oriented to place and time. Recent and remote memory intact. Attention span, concentration and fund of knowledge appropriate. Mood and affect appropriate.  Mini-Mental status exam 21/309 last visit 29/30).  Deficits in orientation, recall and copying.  Clock drawing 3/4.  Diminished recall 0/3.  Able to name only 5 animals that can walk on 4 legs.   Cranial Nerves: Fundoscopic exam reveals sharp disc margins. Pupils equal, briskly reactive to light. Extraocular movements full without nystagmus. Visual fields full to confrontation. Hearing mildly diminished bilaterally. Facial sensation intact. Face, tongue, palate moves normally and symmetrically.  Motor: Normal bulk and tone. Normal strength in all tested extremity muscles except diminished fine finger movements on the left and orbits right over left upper extremity.  Mild weakness of left grip.. Sensory.: intact to touch , pinprick , position and vibratory sensation.  Coordination: Rapid alternating movements normal in all extremities. Finger-to-nose and heel-to-shin performed accurately bilaterally. Gait and Station: Arises from chair without difficulty. Stance is slightly stooped gait demonstrates normal stride length and mild iimbalance .  Unable to do tandem walking. Reflexes: 1+ and symmetric.  Toes downgoing.     MMSE - Mini Mental State Exam 03/01/2021 08/28/2020 01/08/2017  Orientation to time 1 5 4   Orientation to Place 5 5 4   Registration 3 3 3   Attention/  Calculation 5 5 5   Recall 0 2 1  Language- name 2 objects 2 2 2   Language- repeat 1 1 1   Language- follow 3 step command 2 3 3   Language- read & follow direction 1 1 1   Write a sentence 1 1 1   Copy design 0 1 1  Total score 21 29 26       ASSESSMENT: 87 year old Caucasian lady with increasing recent falls likely multifactorial due to combination of mild cognitive impairment and medication effect and prior stroke.  Recent cognitive suggest progression to early dementia.  Remote history of right thalamic hemorrhage in Apr 2012 of hypertensive etiology     PLAN: I had a long discussion with the patient with regards to her memory loss and cognitive worsening which appears not to her responded to Aricept.  Recommend a trial of Namenda starter pack and if tolerated without side effects take 10 mg twice daily.  She was also encouraged to increase participation in cognitively challenging activities like solving crossword puzzles, playing bridge and sodoku.  We also discussed memory compensation strategies.  She was advised to use a cane at all times and we discussed fall precautions.  She will return for follow-up in the future in 3 months or call earlier if necessary. She will stay on aspirin for stroke prevention and maintain aggressive risk factor modification with strict control of hypertension with blood pressure goal below 130/90, lipids with LDL cholesterol goal below 70 mg percent.  She was encouraged to use a cane at all times and we discussed fall prevention precautions.  She will return for follow-up in the future in 3 months or call earlier if necessary.  Greater than 50% time during this 25-minute   visit was spent on counseling and coordination of care about her falls, imbalance, cognitive impairment and answering  questions Antony Contras, MD Note: This document was prepared with digital dictation and possible smart phrase technology. Any transcriptional errors that result from this process are unintentional.

## 2021-03-01 NOTE — Patient Instructions (Signed)
I had a long discussion with the patient with regards to her memory loss and cognitive worsening which appears not to her responded to Aricept.  Recommend a trial of Namenda starter pack and if tolerated without side effects take 10 mg twice daily.  She was also encouraged to increase participation in cognitively challenging activities like solving crossword puzzles, playing bridge and sodoku.  We also discussed memory compensation strategies.  She was advised to use a cane at all times and we discussed fall precautions.  She will return for follow-up in the future in 3 months or call earlier if necessary. Memory Compensation Strategies  Use "WARM" strategy.  W= write it down  A= associate it  R= repeat it  M= make a mental note  2.   You can keep a Social worker.  Use a 3-ring notebook with sections for the following: calendar, important names and phone numbers,  medications, doctors' names/phone numbers, lists/reminders, and a section to journal what you did  each day.   3.    Use a calendar to write appointments down.  4.    Write yourself a schedule for the day.  This can be placed on the calendar or in a separate section of the Memory Notebook.  Keeping a  regular schedule can help memory.  5.    Use medication organizer with sections for each day or morning/evening pills.  You may need help loading it  6.    Keep a basket, or pegboard by the door.  Place items that you need to take out with you in the basket or on the pegboard.  You may also want to  include a message board for reminders.  7.    Use sticky notes.  Place sticky notes with reminders in a place where the task is performed.  For example: " turn off the  stove" placed by the stove, "lock the door" placed on the door at eye level, " take your medications" on  the bathroom mirror or by the place where you normally take your medications.  8.    Use alarms/timers.  Use while cooking to remind yourself to check on food or as  a reminder to take your medicine, or as a  reminder to make a call, or as a reminder to perform another task, etc.

## 2021-03-08 ENCOUNTER — Ambulatory Visit: Payer: PPO

## 2021-03-08 ENCOUNTER — Encounter: Payer: Self-pay | Admitting: Orthopedic Surgery

## 2021-03-08 ENCOUNTER — Ambulatory Visit (INDEPENDENT_AMBULATORY_CARE_PROVIDER_SITE_OTHER): Payer: PPO | Admitting: Orthopedic Surgery

## 2021-03-08 ENCOUNTER — Other Ambulatory Visit: Payer: Self-pay

## 2021-03-08 VITALS — Ht 61.0 in | Wt 139.0 lb

## 2021-03-08 DIAGNOSIS — M25561 Pain in right knee: Secondary | ICD-10-CM

## 2021-03-08 DIAGNOSIS — M1711 Unilateral primary osteoarthritis, right knee: Secondary | ICD-10-CM

## 2021-03-08 DIAGNOSIS — G8929 Other chronic pain: Secondary | ICD-10-CM

## 2021-03-08 DIAGNOSIS — M1710 Unilateral primary osteoarthritis, unspecified knee: Secondary | ICD-10-CM

## 2021-03-08 MED ORDER — MELOXICAM 7.5 MG PO TABS: 7.5000 mg | ORAL_TABLET | Freq: Every day | ORAL | 5 refills | Status: DC

## 2021-03-08 NOTE — Progress Notes (Addendum)
NEW PROBLEM//OFFICE VISIT  Summary assessment and plan:   87 year old female with coronary artery disease hypertension osteoarthritis right knee currently using a cane recommend oral medication  Chief Complaint  Patient presents with   Knee Pain    Rt knee pain and swelling for years but has gotten worse over the past 6 months. Had injection in her knee Feb or Mar '53   87 year old female with dementia relative with her helps give the history today says she has a early onset dementia as well she has had 1 injection in the right knee comes in for right knee pain over the medial joint line which has been present for over a year  Seems to have had an x-ray last year but no other treatment orally  She does have hypertension and coronary artery disease  Injection right knee start oral anti-inflammatory  Meds ordered this encounter  Medications   meloxicam (MOBIC) 7.5 MG tablet    Sig: Take 1 tablet (7.5 mg total) by mouth daily.    Dispense:  30 tablet    Refill:  5     MEDICAL DECISION MAKING  A.  Encounter Diagnoses  Name Primary?   Chronic pain of right knee Yes   Primary localized osteoarthritis of knee     B. DATA ANALYSED:   IMAGING: Interpretation of images: X-rays from last year 4 views of the right knee done at [facility] show varus alignment to the right knee moderate to severe arthritis and joint effusion with notable secondary bone changes of sclerosis and osteophyte formation  Internal images taken today Orders: No new orders  Outside records reviewed: No records   C. MANAGEMENT   Nonoperative treatment  Meds ordered this encounter  Medications   meloxicam (MOBIC) 7.5 MG tablet    Sig: Take 1 tablet (7.5 mg total) by mouth daily.    Dispense:  30 tablet    Refill:  5   A steroid injection was performed at right knee using 1% plain Lidocaine and 6 mg Celestone     Ht 5\' 1"  (1.549 m)   Wt 139 lb (63 kg)   BMI 26.26 kg/m    General  appearance: Well-developed well-nourished no gross deformities  Cardiovascular normal pulse and perfusion normal color without edema  Neurologically no sensation loss or deficits or pathologic reflexes  Psychological: Awake alert and oriented x3 mood and affect normal  Skin no lacerations or ulcerations no nodularity no palpable masses, no erythema or nodularity  Musculoskeletal:   Right knee medial joint line tenderness no effusion 8 degree lack of extension flexion is good 120 degrees knee is stable muscle tone is normal   ROS Memory loss Question hearing loss No recent chest pain  Past Medical History:  Diagnosis Date   Allergic rhinitis    Altered mental status    Per records at [place], bilateral    Cerebral infarct [facility])    Per records at [facility]    Coronary atherosclerosis of native coronary artery    DES LAD 3/12, 99% nondominant RCA, No CAD otherwise, LVEF 65%   Coronary atherosclerosis of native coronary artery    Per records at [facility]    Diabetic retinopathy [facility])    Disorientation, unspecified    Per records at [facility]    Diverticulitis    Essential hypertension    GERD (gastroesophageal reflux disease)    Hemorrhoids    Hyperlipidemia  Major depressive disorder with single episode    Per records at [facility]    Metabolic encephalopathy    Per records at [facility]    Muscle weakness (generalized)    Per records at [facility]    Stroke, hemorrhagic [facility])    Right thalamic hemorrhage 4/12   Syncope    Neurally mediated   Syncope and collapse    Per records at [facility]    Type 2 diabetes mellitus ([place])    Type 2 diabetes mellitus with unspecified diabetic retinopathy without macular edema ([place])    Per records at [facility]    Unspecified dementia without behavioral disturbance ([place])    Per records at [facility]     Past Surgical  History:  Procedure Laterality Date   BREAST CYST EXCISION     CARDIAC CATHETERIZATION     CAROTID STENT  11/16/10   COLONOSCOPY N/A 10/02/2014   Dr. Rourk:engorged internal hemorrhoids/pancolonic diverticulosis   ESOPHAGOGASTRODUODENOSCOPY  10/02/14   Dr. Rourk:normal esophagus 2 cm hiatal hernia, gastric erosions likely from NSAID effect    Family History  Problem Relation Age of Onset   Pancreatic cancer Mother        Died at age 51   Diabetes Mother    Cancer Mother    Coronary artery disease Father        Died in his 62s   Heart attack Father    Hypertension Father    Breast cancer Sister    Cancer Sister    Social History   Tobacco Use   Smoking status: Former    Packs/day: 0.25    Pack years: 0.00    Types: Cigarettes    Quit date: 09/02/1988    Years since quitting: 32.5   Smokeless tobacco: Never  Vaping Use   Vaping Use: Never used  Substance Use Topics   Alcohol use: No    Alcohol/week: 0.0 standard drinks   Drug use: No    Allergies  Allergen Reactions   Macrodantin    Pravachol [Pravastatin Sodium]    Tetanus Toxoids     Current Meds  Medication Sig   acetaminophen (TYLENOL) 325 MG tablet Take 2 tablets (650 mg total) by mouth every 4 (four) hours as needed for mild pain, fever or headache (or Fever >/= 101).   amLODipine (NORVASC) 2.5 MG tablet Take 1 tablet (2.5 mg total) by mouth daily.   aspirin 81 MG tablet Take 1 tablet (81 mg total) by mouth daily with breakfast.   donepezil (ARICEPT) 10 MG tablet Take 1 tablet (10 mg total) by mouth at bedtime. Take 1 tablet daily   escitalopram (LEXAPRO) 20 MG tablet Take 1.5 tablets (30 mg total) by mouth daily.   ezetimibe (ZETIA) 10 MG tablet TAKE ONE TABLET BY MOUTH DAILY.   fluvastatin (LESCOL) 40 MG capsule TAKE 1 CAPSULE BY MOUTH ONCE DAILY.   Melatonin 10 MG TABS Take 10 mg by mouth daily after supper.   meloxicam (MOBIC) 7.5 MG tablet Take 1 tablet (7.5 mg total) by mouth daily.   memantine  (NAMENDA TITRATION PAK) tablet pack 5 mg/day for =1 week; 5 mg twice daily for =1 week; 15 mg/day given in 5 mg and 10 mg separated doses for =1 week; then 10 mg twice daily   metoprolol succinate (TOPROL-XL) 25 MG 24 hr tablet TAKE (1) TABLET BY MOUTH AT BEDTIME. NEEDS OFFICE VISIT   polyethylene glycol (MIRALAX / GLYCOLAX)  17 g packet Take 17 g by mouth daily.   ramipril (ALTACE) 5 MG capsule Take 1 capsule (5 mg total) by mouth daily.   traZODone (DESYREL) 50 MG tablet Take 1 tab for 7 days, then increase to 2 tab p.o. qhs for insomnia.   UNABLE TO FIND Diet: NAS        Arther Abbott, MD  03/08/2021 3:27 PM

## 2021-03-08 NOTE — Patient Instructions (Signed)
You have received an injection of steroids into the joint. 15% of patients will have increased pain within the 24 hours postinjection.   This is transient and will go away.   We recommend that you use ice packs on the injection site for 20 minutes every 2 hours and extra strength Tylenol 2 tablets every 8 as needed until the pain resolves.  If you continue to have pain after taking the Tylenol and using the ice please call the office for further instructions.  Start   Meds ordered this encounter  Medications   meloxicam (MOBIC) 7.5 MG tablet    Sig: Take 1 tablet (7.5 mg total) by mouth daily.    Dispense:  30 tablet    Refill:  5

## 2021-03-24 ENCOUNTER — Ambulatory Visit
Admission: EM | Admit: 2021-03-24 | Discharge: 2021-03-24 | Disposition: A | Discharge status: Home / Self Care | Payer: PPO | Attending: Family Medicine | Admitting: Family Medicine

## 2021-03-24 ENCOUNTER — Encounter: Payer: Self-pay | Admitting: Emergency Medicine

## 2021-03-24 ENCOUNTER — Other Ambulatory Visit: Payer: Self-pay

## 2021-03-24 DIAGNOSIS — N3000 Acute cystitis without hematuria: Secondary | ICD-10-CM | POA: Insufficient documentation

## 2021-03-24 DIAGNOSIS — R109 Unspecified abdominal pain: Secondary | ICD-10-CM | POA: Insufficient documentation

## 2021-03-24 DIAGNOSIS — G8929 Other chronic pain: Secondary | ICD-10-CM | POA: Diagnosis not present

## 2021-03-24 LAB — POCT URINALYSIS DIP (MANUAL ENTRY)
Glucose, UA: 250 mg/dL — AB
Leukocytes, UA: NEGATIVE
Nitrite, UA: POSITIVE — AB
Protein Ur, POC: 30 mg/dL — AB
Spec Grav, UA: >=1.03 — AB (ref 1.010–1.025)
Urobilinogen, UA: 1 E.U./dL
pH, UA: 5 (ref 5.0–8.0)

## 2021-03-24 LAB — POCT FASTING CBG KUC MANUAL ENTRY: POCT Glucose (KUC): 187 mg/dL — AB (ref 70–99)

## 2021-03-24 MED ORDER — ALUM & MAG HYDROXIDE-SIMETH 200-200-20 MG/5ML PO SUSP
15.0000 mL | Freq: Once | ORAL | Status: AC
  Administered 2021-03-24: 15 mL via ORAL

## 2021-03-24 MED ORDER — CEPHALEXIN 500 MG PO CAPS: 500.0000 mg | ORAL_CAPSULE | Freq: Two times a day (BID) | ORAL | 0 refills | Status: AC

## 2021-03-24 MED ORDER — ALUM & MAG HYDROXIDE-SIMETH 200-200-20 MG/5ML PO SUSP: 15.0000 mL | Freq: Three times a day (TID) | ORAL | 0 refills | Status: AC

## 2021-03-24 NOTE — Discharge Instructions (Addendum)
Go to the emergency if symptom worsen or do  not improve. Schedule CT of abdomen. Keep PCP appointment.

## 2021-03-24 NOTE — ED Triage Notes (Signed)
Lower Abd pain x a few months and burping for the past 2 hours. Was diagnosed with IBS in April.

## 2021-03-24 NOTE — ED Provider Notes (Signed)
[facility]    CSN: [id number] Arrival date & time: 03/24/21  1018      History   Chief Complaint No chief complaint on file.   HPI [name] is a 87 y.o. female.   HPI Patient presents today accompanied by her daughter for evaluation of chronic abdominal pain and belching.  Patient has been seen here previously at urgent care for and referred to the ER due to similar course of symptoms in March.  Patient the following day did see her primary care provider who ordered a CT of the abdomen however daughter reports that she canceled the study as the abdominal pain seemed to improve.  They subsequently have another PCP appointment in the next 3 days.  Daughter brought her in because she is belching frequently over the last several hours and complains of abdominal pain.  She is having diarrhea however continues to have bloating.  Daughter reports that mother was diagnosed with IBS earlier in the year.  Patient also has severe dementia and is unable to fully participate in providing history of care.  Patient also suffers from dehydration and has been hospitalized related to alter mental status due to acute renal failure due to poor oral intake of fluids.  She is complaining today of urinary frequency.  She is a diabetic and without home monitoring of blood sugars.  Daughter reports mother is eating at her baseline however she skipped dinner last night but eats a full breakfast every morning.  She is afebrile. Past Medical History:  Diagnosis Date   Allergic rhinitis    Altered mental status    Per records at [place], bilateral    Cerebral infarct [facility])    Per records at [facility]    Coronary atherosclerosis of native coronary artery    DES LAD 3/12, 99% nondominant RCA, No CAD otherwise, LVEF 65%   Coronary atherosclerosis of native coronary artery    Per records at [facility]    Diabetic retinopathy ([place])    Disorientation,  unspecified    Per records at [facility]    Diverticulitis    Essential hypertension    GERD (gastroesophageal reflux disease)    Hemorrhoids    Hyperlipidemia    Major depressive disorder with single episode    Per records at [facility]    Metabolic encephalopathy    Per records at [facility]    Muscle weakness (generalized)    Per records at [facility]    Stroke, hemorrhagic [facility])    Right thalamic hemorrhage 4/12   Syncope    Neurally mediated   Syncope and collapse    Per records at [facility]    Type 2 diabetes mellitus ([place])    Type 2 diabetes mellitus with unspecified diabetic retinopathy without macular edema ([place])    Per records at [facility]    Unspecified dementia without behavioral disturbance [facility])    Per records at [facility]     Patient Active Problem List   Diagnosis Date Noted   Herpes zoster without complication [id number]   Chronic constipation 08/04/2020   Major depression, recurrent, chronic ([place]) 08/04/2020   Hyperlipidemia associated with type 2 diabetes mellitus ([place]) 08/04/2020   Hyponatremia 08/04/2020   Moderately advanced dementia without behavioral disturbance ([place]) [id number]   Acute metabolic encephalopathy [id number]   AMS (altered mental status) 07/31/2020   Osteoarthritis of right knee  04/14/2020   History of stroke 01/09/2016   Acute blood loss anemia    BRBPR (bright red blood per rectum) 09/30/2014   DM (diabetes mellitus), type 2 with neurological complications ([place]) [id number]   Diabetic retinopathy ([place]) 03/16/2013   H/o Stroke /--history of chronic microhemorrhages 01/24/2011   Coronary atherosclerosis of native coronary artery 12/13/2010   Mixed hyperlipidemia 11/05/2010   Essential hypertension, benign 11/05/2010   GERD 11/05/2010    Past Surgical History:  Procedure Laterality Date   BREAST CYST EXCISION     CARDIAC CATHETERIZATION     CAROTID STENT  11/16/10    COLONOSCOPY N/A 10/02/2014   Dr. Rourk:engorged internal hemorrhoids/pancolonic diverticulosis   ESOPHAGOGASTRODUODENOSCOPY  10/02/14   Dr. Rourk:normal esophagus 2 cm hiatal hernia, gastric erosions likely from NSAID effect    OB History   No obstetric history on file.      Home Medications    Prior to Admission medications   Medication Sig Start Date End Date Taking? Authorizing Provider  alum & mag hydroxide-simeth (MYLANTA) 200-200-20 MG/5ML suspension Take 15 mLs by mouth every 8 (eight) hours. 03/24/21  Yes Scot Jun, FNP  cephALEXin (KEFLEX) 500 MG capsule Take 1 capsule (500 mg total) by mouth 2 (two) times daily for 7 days. 03/24/21 03/31/21 Yes Scot Jun, FNP  acetaminophen (TYLENOL) 325 MG tablet Take 2 tablets (650 mg total) by mouth every 4 (four) hours as needed for mild pain, fever or headache (or Fever >/= 101). 08/03/20   Roxan Hockey, MD  amLODipine (NORVASC) 2.5 MG tablet Take 1 tablet (2.5 mg total) by mouth daily. 11/17/20   Erven Colla, DO  aspirin 81 MG tablet Take 1 tablet (81 mg total) by mouth daily with breakfast. 08/03/20   Emokpae, Courage, MD  donepezil (ARICEPT) 10 MG tablet Take 1 tablet (10 mg total) by mouth at bedtime. Take 1 tablet daily 11/09/20   Garvin Fila, MD  escitalopram (LEXAPRO) 20 MG tablet Take 1.5 tablets (30 mg total) by mouth daily. 10/30/20   Elvia Collum M, DO  ezetimibe (ZETIA) 10 MG tablet TAKE ONE TABLET BY MOUTH DAILY. 11/30/20   Lovena Le, Malena M, DO  fluvastatin (LESCOL) 40 MG capsule TAKE 1 CAPSULE BY MOUTH ONCE DAILY. 01/08/21   Elvia Collum M, DO  Melatonin 10 MG TABS Take 10 mg by mouth daily after supper. 08/28/20   Garvin Fila, MD  meloxicam (MOBIC) 7.5 MG tablet Take 1 tablet (7.5 mg total) by mouth daily. 03/08/21   Carole Civil, MD  memantine [facility] TITRATION PAK) tablet pack 5 mg/day for =1 week; 5 mg twice daily for =1 week; 15 mg/day given in 5 mg and 10 mg separated doses for =1 week;  then 10 mg twice daily 03/01/21   Garvin Fila, MD  memantine (NAMENDA) 10 MG tablet Take 1 tablet (10 mg total) by mouth 2 (two) times daily. Start only after completing titration pack first Patient not taking: Reported on 03/08/2021 03/01/21   Garvin Fila, MD  metoprolol succinate (TOPROL-XL) 25 MG 24 hr tablet TAKE (1) TABLET BY MOUTH AT BEDTIME. NEEDS OFFICE VISIT 10/30/20   Elvia Collum M, DO  naphazoline-glycerin (CLEAR EYES REDNESS) 0.012-0.2 % SOLN Place 2 drops into both eyes 4 (four) times daily as needed for eye irritation. Patient not taking: Reported on 03/08/2021 08/03/20   Roxan Hockey, MD  nitroGLYCERIN (NITROSTAT) 0.4 MG SL tablet Dissolve 1 tablet under tongue every 5 mins up to 3 dose  in 15 mins for chest pain. If no relief call 911. Patient not taking: Reported on 03/08/2021 08/18/20   Gerlene Fee, NP  polyethylene glycol (MIRALAX / GLYCOLAX) 17 g packet Take 17 g by mouth daily. 08/03/20   Roxan Hockey, MD  ramipril (ALTACE) 5 MG capsule Take 1 capsule (5 mg total) by mouth daily. 10/18/20   Elvia Collum M, DO  traZODone (DESYREL) 50 MG tablet Take 1 tab for 7 days, then increase to 2 tab p.o. qhs for insomnia. 11/03/20   Lovena Le, Malena M, DO  triamcinolone (KENALOG) 0.1 % APPLY TO AFFECTED AREA TWICE A DAY. Patient not taking: Reported on 03/08/2021 11/07/20   Erven Colla, DO  UNABLE TO FIND Diet: NAS    [provider]    Family History Family History  Problem Relation Age of Onset   Pancreatic cancer Mother        Died at age 87   Diabetes Mother    Cancer Mother    Coronary artery disease Father        Died in his 50s   Heart attack Father    Hypertension Father    Breast cancer Sister    Cancer Sister     Social History Social History   Tobacco Use   Smoking status: Former    Packs/day: 0.25    Types: Cigarettes    Quit date: 09/02/1988    Years since quitting: 32.5   Smokeless tobacco: Never  Vaping Use   Vaping Use: Never used   Substance Use Topics   Alcohol use: No    Alcohol/week: 0.0 standard drinks   Drug use: No     Allergies   Macrodantin, Penicillins, Pravachol [pravastatin sodium], and Tetanus toxoids   Review of Systems Review of Systems Pertinent negatives listed in HPI  Physical Exam Triage Vital Signs ED Triage Vitals  Enc Vitals Group     BP 03/24/21 1025 129/65     Pulse Rate 03/24/21 1025 64     Resp 03/24/21 1025 16     Temp 03/24/21 1025 98.6 F (37 C)     Temp Source 03/24/21 1025 Oral     SpO2 03/24/21 1025 93 %     Weight --      Height --      Head Circumference --      Peak Flow --      Pain Score 03/24/21 1028 10     Pain Loc --      Pain Edu? --      Excl. in [place]? --    No data found.  Updated Vital Signs BP 129/65 (BP Location: Right Arm)   Pulse 64   Temp 98.6 F (37 C) (Oral)   Resp 16   SpO2 93%   Visual Acuity Right Eye Distance:   Left Eye Distance:   Bilateral Distance:    Right Eye Near:   Left Eye Near:    Bilateral Near:     Physical Exam Constitutional:      Comments: Patient cooperative without distress however chronically ill in appearance  HENT:     Head: Normocephalic and atraumatic.  Eyes:     Extraocular Movements: Extraocular movements intact.     Pupils: Pupils are equal, round, and reactive to light.  Cardiovascular:     Rate and Rhythm: Normal rate and regular rhythm.  Pulmonary:     Effort: Pulmonary effort is normal.     Comments: Generalized diminished lung  sounds without rhonchi or rales Abdominal:     Tenderness: There is no right CVA tenderness, left CVA tenderness, guarding or rebound.     Comments: Abdomen distended, hardened, without overt rebound tenderness Increase active bowel sounds  Skin:    General: Skin is warm and dry.  Neurological:     Mental Status: Mental status is at baseline.  Psychiatric:     Comments: Mood appropriate with underlying cognitive alterations     UC Treatments / Results   Labs (all labs ordered are listed, but only abnormal results are displayed) Labs Reviewed  POCT URINALYSIS DIP (MANUAL ENTRY) - Abnormal; Notable for the following components:      Result Value   Color, UA straw (*)    Clarity, UA hazy (*)    Glucose, UA =250 (*)    Bilirubin, UA moderate (*)    Ketones, POC UA trace (5) (*)    Spec Grav, UA >=1.030 (*)    Blood, UA trace-lysed (*)    Protein Ur, POC =30 (*)    Nitrite, UA Positive (*)    All other components within normal limits  POCT FASTING CBG [facility] MANUAL ENTRY - Abnormal; Notable for the following components:   POCT Glucose ([facility]) 187 (*)    All other components within normal limits  URINE CULTURE    EKG   Radiology No results found.  Procedures Procedures (including critical care time)  Medications Ordered in UC Medications  alum & mag hydroxide-simeth (MAALOX/MYLANTA) 200-200-20 MG/5ML suspension 15 mL (15 mLs Oral Given 03/24/21 1109)    Initial Impression / Assessment and Plan / UC Course  I have reviewed the triage vital signs and the nursing notes.  Pertinent labs & imaging results that were available during my care of the patient were reviewed by me and considered in my medical decision making (see chart for details).    Mylanta 15 mL given today for belching.  Sent prescription to pharmacy advised to continue as needed use of Mylanta 3 times daily as needed for belching.  As for chronic persistent abdominal pain reinforced again the importance of obtaining sent CT of the abdomen given inability to identify source of chronic abdominal pain explained to daughter it is imperative to have the scan completed to definitively know if patient has chronic diverticular disease or is there another source of her chronic abdominal pain.  Patient is significantly distended however continues to have bowel movements.  She has an appointment in 3 days with her primary care doctor however explained to daughter the importance of  patient being seen at the emergency department if any of her symptoms worsen.  Patient has nitrites and glucose present in her urine today I am covering for urinary tract infection while waiting on the urine culture.  Daughter is hesitant about starting patient on antibiotics due to patient has been intolerant to all antibiotics due to diarrhea.  Encouraged daughter to open capsules and mix with applesauce to deliver medication as if patient has a severe UTI she can experience altered mental status as well as urosepsis and this could result in hospitalization.  Also encouraged increase of water to maintain hydration given the degree of humidity and heat in the atmosphere patient is at high risk of dehydration.  Daughter verbalized understanding and agreement with plan. Final Clinical Impressions(s) / UC Diagnoses   Final diagnoses:  Chronic abdominal pain  Acute cystitis without hematuria     Discharge Instructions      Go  to the emergency if symptom worsen or do  not improve. Schedule CT of abdomen. Keep PCP appointment.     ED Prescriptions     Medication Sig Dispense Auth. Provider   cephALEXin (KEFLEX) 500 MG capsule Take 1 capsule (500 mg total) by mouth 2 (two) times daily for 7 days. 14 capsule Scot Jun, FNP   alum & mag hydroxide-simeth (MYLANTA) 200-200-20 MG/5ML suspension Take 15 mLs by mouth every 8 (eight) hours. 355 mL Scot Jun, FNP      PDMP not reviewed this encounter.   Scot Jun, FNP 03/24/21 1115

## 2021-03-25 LAB — URINE CULTURE
Culture: <10000 — AB
Special Requests: NORMAL

## 2021-03-27 DIAGNOSIS — F411 Generalized anxiety disorder: Secondary | ICD-10-CM | POA: Diagnosis not present

## 2021-03-27 DIAGNOSIS — R142 Eructation: Secondary | ICD-10-CM | POA: Diagnosis not present

## 2021-03-27 DIAGNOSIS — Z79899 Other long term (current) drug therapy: Secondary | ICD-10-CM | POA: Diagnosis not present

## 2021-03-27 DIAGNOSIS — R197 Diarrhea, unspecified: Secondary | ICD-10-CM | POA: Diagnosis not present

## 2021-03-27 DIAGNOSIS — F419 Anxiety disorder, unspecified: Secondary | ICD-10-CM | POA: Diagnosis not present

## 2021-03-27 DIAGNOSIS — E782 Mixed hyperlipidemia: Secondary | ICD-10-CM | POA: Diagnosis not present

## 2021-03-27 DIAGNOSIS — R109 Unspecified abdominal pain: Secondary | ICD-10-CM | POA: Diagnosis not present

## 2021-04-02 DIAGNOSIS — H25812 Combined forms of age-related cataract, left eye: Secondary | ICD-10-CM | POA: Diagnosis not present

## 2021-04-04 NOTE — H&P (Signed)
Surgical History & Physical  Patient Name: [name] DOB: [birth year]  Surgery: Cataract extraction with intraocular lens implant phacoemulsification; Left Eye  Surgeon: Baruch Goldmann MD Surgery Date:  04/11/2021 Pre-Op Date:  04/02/2021  HPI: A 89 Yr. old female patient 1. The patient presents for a cataract evaluation, referred by Dr. Jorja Loa. The patient complains of difficulty when viewing TV, reading closed caption, news scrolls on TV, which began many years ago. Both eyes are affected, OS >OD. The episode is gradual. The condition's severity decreased since last visit. The condition is worse with daily activities. The complaint is associated with blurry vision. The patient complains of blurry vision causing difficulties when watching television and when reading. The patient experiences no eye pain and no flashes, floater, shadow, curtain or veil.The patient denies any pain, but reports tearing OS. The patient is diabetic and has a hx of retinopathy OD. Of note: pt has dementia. HPI was performed by Baruch Goldmann .  Medical History: Dry Eyes Cataracts OD: Diabetic Retinopathy, OS: PVD Acid Reflux, Cholesterol Arthritis Diabetes High Blood Pressure Stroke  Review of Systems Negative Allergic/Immunologic Negative Cardiovascular Negative Constitutional Negative Ear, Nose, Mouth & Throat Negative Endocrine Negative Eyes Negative Gastrointestinal Negative Genitourinary Negative Hemotologic/Lymphatic Negative Integumentary Negative Musculoskeletal Negative Neurological Negative Psychiatry Negative Respiratory  Social   Never smoked   Medication Pataday, Soothe XP,  Aspirin, Calcium, Centrum Silver, Cinnamon, Glucosamine, Lescol, Metoprolol, Potassium, Vitamin C, Trazodone, Ramipril, Escitalopram, Donepezil, Ezetimibe, Fluvoxamine,   Sx/Procedures Cyst removed from Left Breast, Heart Stents x 3,   Drug Allergies  Microdantin,   History & Physical: Heent:   Cataract, Left eye NECK: supple without bruits LUNGS: lungs clear to auscultation CV: regular rate and rhythm Abdomen: soft and non-tender  Impression & Plan: Assessment: 1.  COMBINED FORMS AGE RELATED CATARACT; Both Eyes (H25.813) 2.  BLEPHARITIS; Right Upper Lid, Right Lower Lid (H01.001, H01.002) 3.  DERMATOCHALASIS; Right Upper Lid, Left Upper Lid (H02.831, [id number])  Plan: 1.  Cataract accounts for the patient's decreased vision. This visual impairment is not correctable with a tolerable change in glasses or contact lenses. Cataract surgery with an implantation of a new lens should significantly improve the visual and functional status of the patient. Discussed all risks, benefits, alternatives, and potential complications. Discussed the procedures and recovery. Patient desires to have surgery. A-scan ordered and performed today for intra-ocular lens calculations. The surgery will be performed in order to improve vision for driving, reading, and for eye examinations. Recommend phacoemulsification with intra-ocular lens. Recommend Dextenza for post-operative pain and inflammation. Left Eye much worse - first. Dilates poorly - shugacaine by protocol. Malyugin Ring. Omidira.  2.  Recommended regular lid cleaning.  3.  Asymptomatic, recommend observation for now. Findings, prognosis and treatment options reviewed.

## 2021-04-05 ENCOUNTER — Other Ambulatory Visit: Payer: Self-pay

## 2021-04-05 ENCOUNTER — Encounter (HOSPITAL_COMMUNITY): Payer: Self-pay

## 2021-04-05 ENCOUNTER — Encounter (HOSPITAL_COMMUNITY)
Admission: RE | Admit: 2021-04-05 | Discharge: 2021-04-05 | Disposition: A | Discharge status: Home / Self Care | Payer: PPO | Source: Ambulatory Visit | Attending: Ophthalmology | Admitting: Ophthalmology

## 2021-04-05 ENCOUNTER — Other Ambulatory Visit: Payer: Self-pay | Admitting: Internal Medicine

## 2021-04-05 DIAGNOSIS — R109 Unspecified abdominal pain: Secondary | ICD-10-CM

## 2021-04-09 ENCOUNTER — Ambulatory Visit (HOSPITAL_COMMUNITY)
Admission: RE | Admit: 2021-04-09 | Discharge: 2021-04-09 | Disposition: A | Discharge status: Home / Self Care | Payer: PPO | Source: Ambulatory Visit | Attending: Internal Medicine | Admitting: Internal Medicine

## 2021-04-09 ENCOUNTER — Other Ambulatory Visit: Payer: Self-pay

## 2021-04-09 DIAGNOSIS — R109 Unspecified abdominal pain: Secondary | ICD-10-CM | POA: Diagnosis not present

## 2021-04-09 LAB — POCT I-STAT CREATININE: Creatinine, Ser: 1 mg/dL (ref 0.44–1.00)

## 2021-04-09 MED ORDER — IOHEXOL 350 MG/ML SOLN
80.0000 mL | Freq: Once | INTRAVENOUS | Status: AC | PRN
  Administered 2021-04-09: 80 mL via INTRAVENOUS

## 2021-04-10 ENCOUNTER — Other Ambulatory Visit (HOSPITAL_COMMUNITY): Payer: Self-pay | Admitting: Family Medicine

## 2021-04-10 ENCOUNTER — Other Ambulatory Visit: Payer: Self-pay | Admitting: Family Medicine

## 2021-04-10 DIAGNOSIS — R109 Unspecified abdominal pain: Secondary | ICD-10-CM

## 2021-04-11 ENCOUNTER — Ambulatory Visit (HOSPITAL_COMMUNITY): Payer: PPO | Admitting: Certified Registered"

## 2021-04-11 ENCOUNTER — Other Ambulatory Visit: Payer: Self-pay

## 2021-04-11 ENCOUNTER — Encounter (HOSPITAL_COMMUNITY): Payer: Self-pay | Admitting: Ophthalmology

## 2021-04-11 ENCOUNTER — Ambulatory Visit (HOSPITAL_COMMUNITY)
Admission: RE | Admit: 2021-04-11 | Discharge: 2021-04-11 | Disposition: A | Discharge status: Home / Self Care | Payer: PPO | Attending: Ophthalmology | Admitting: Ophthalmology

## 2021-04-11 ENCOUNTER — Encounter (HOSPITAL_COMMUNITY)
Admission: RE | Disposition: A | Discharge status: Home / Self Care | Payer: Self-pay | Source: Home / Self Care | Attending: Ophthalmology

## 2021-04-11 DIAGNOSIS — H02834 Dermatochalasis of left upper eyelid: Secondary | ICD-10-CM | POA: Diagnosis not present

## 2021-04-11 DIAGNOSIS — E11319 Type 2 diabetes mellitus with unspecified diabetic retinopathy without macular edema: Secondary | ICD-10-CM | POA: Diagnosis not present

## 2021-04-11 DIAGNOSIS — E1136 Type 2 diabetes mellitus with diabetic cataract: Secondary | ICD-10-CM | POA: Diagnosis not present

## 2021-04-11 DIAGNOSIS — H11812 Pseudopterygium of conjunctiva, left eye: Secondary | ICD-10-CM | POA: Diagnosis not present

## 2021-04-11 DIAGNOSIS — I2510 Atherosclerotic heart disease of native coronary artery without angina pectoris: Secondary | ICD-10-CM | POA: Diagnosis not present

## 2021-04-11 DIAGNOSIS — H0100A Unspecified blepharitis right eye, upper and lower eyelids: Secondary | ICD-10-CM | POA: Insufficient documentation

## 2021-04-11 DIAGNOSIS — H25812 Combined forms of age-related cataract, left eye: Secondary | ICD-10-CM | POA: Insufficient documentation

## 2021-04-11 DIAGNOSIS — H02831 Dermatochalasis of right upper eyelid: Secondary | ICD-10-CM | POA: Diagnosis not present

## 2021-04-11 DIAGNOSIS — Z8673 Personal history of transient ischemic attack (TIA), and cerebral infarction without residual deficits: Secondary | ICD-10-CM | POA: Insufficient documentation

## 2021-04-11 DIAGNOSIS — E1151 Type 2 diabetes mellitus with diabetic peripheral angiopathy without gangrene: Secondary | ICD-10-CM | POA: Insufficient documentation

## 2021-04-11 HISTORY — PX: CATARACT EXTRACTION W/PHACO: SHX586

## 2021-04-11 LAB — GLUCOSE, CAPILLARY: Glucose-Capillary: 103 mg/dL — ABNORMAL HIGH (ref 70–99)

## 2021-04-11 SURGERY — PHACOEMULSIFICATION, CATARACT, WITH IOL INSERTION
Anesthesia: Monitor Anesthesia Care | Site: Eye | Laterality: Left

## 2021-04-11 MED ORDER — PHENYLEPHRINE-KETOROLAC 1-0.3 % IO SOLN
INTRAOCULAR | Status: AC
  Filled 2021-04-11: qty 4

## 2021-04-11 MED ORDER — SODIUM HYALURONATE 23MG/ML IO SOSY
PREFILLED_SYRINGE | INTRAOCULAR | Status: DC | PRN
  Administered 2021-04-11: 0.6 mL via INTRAOCULAR

## 2021-04-11 MED ORDER — BSS IO SOLN
INTRAOCULAR | Status: DC | PRN
  Administered 2021-04-11: 15 mL via INTRAOCULAR

## 2021-04-11 MED ORDER — EPINEPHRINE PF 1 MG/ML IJ SOLN
INTRAMUSCULAR | Status: AC
  Filled 2021-04-11: qty 1

## 2021-04-11 MED ORDER — LIDOCAINE HCL (PF) 1 % IJ SOLN
INTRAOCULAR | Status: DC | PRN
  Administered 2021-04-11: 1 mL via OPHTHALMIC

## 2021-04-11 MED ORDER — TRYPAN BLUE 0.06 % OP SOLN
OPHTHALMIC | Status: AC
  Filled 2021-04-11: qty 0.5

## 2021-04-11 MED ORDER — POVIDONE-IODINE 5 % OP SOLN
OPHTHALMIC | Status: DC | PRN
  Administered 2021-04-11: 1 via OPHTHALMIC

## 2021-04-11 MED ORDER — TROPICAMIDE 1 % OP SOLN
1.0000 [drp] | OPHTHALMIC | Status: AC
  Administered 2021-04-11 (×3): 1 [drp] via OPHTHALMIC

## 2021-04-11 MED ORDER — PHENYLEPHRINE HCL 2.5 % OP SOLN
1.0000 [drp] | OPHTHALMIC | Status: AC | PRN
  Administered 2021-04-11 (×3): 1 [drp] via OPHTHALMIC

## 2021-04-11 MED ORDER — SODIUM HYALURONATE 10 MG/ML IO SOLUTION
PREFILLED_SYRINGE | INTRAOCULAR | Status: DC | PRN
  Administered 2021-04-11: 0.85 mL via INTRAOCULAR

## 2021-04-11 MED ORDER — TETRACAINE HCL 0.5 % OP SOLN
1.0000 [drp] | OPHTHALMIC | Status: AC | PRN
  Administered 2021-04-11 (×3): 1 [drp] via OPHTHALMIC

## 2021-04-11 MED ORDER — NEOMYCIN-POLYMYXIN-DEXAMETH 3.5-10000-0.1 OP SUSP
OPHTHALMIC | Status: DC | PRN
  Administered 2021-04-11: 1 [drp] via OPHTHALMIC

## 2021-04-11 MED ORDER — TRYPAN BLUE 0.06 % OP SOLN
OPHTHALMIC | Status: DC | PRN
  Administered 2021-04-11: 0.5 mL via INTRAOCULAR

## 2021-04-11 MED ORDER — LACTATED RINGERS IV SOLN: INTRAVENOUS | Status: DC

## 2021-04-11 MED ORDER — PHENYLEPHRINE-KETOROLAC 1-0.3 % IO SOLN
INTRAOCULAR | Status: DC | PRN
  Administered 2021-04-11: 500 mL via OPHTHALMIC

## 2021-04-11 MED ORDER — CYCLOPENTOLATE-PHENYLEPHRINE 0.2-1 % OP SOLN
1.0000 [drp] | OPHTHALMIC | Status: DC | PRN
  Filled 2021-04-11: qty 2

## 2021-04-11 MED ORDER — STERILE WATER FOR IRRIGATION IR SOLN
Status: DC | PRN
  Administered 2021-04-11: 250 mL

## 2021-04-11 MED ORDER — LIDOCAINE HCL 3.5 % OP GEL
1.0000 "application " | Freq: Once | OPHTHALMIC | Status: AC
  Administered 2021-04-11: 1 via OPHTHALMIC

## 2021-04-11 SURGICAL SUPPLY — 12 items
CLOTH BEACON ORANGE TIMEOUT ST (SAFETY) ×1 IMPLANT
EYE SHIELD UNIVERSAL CLEAR (GAUZE/BANDAGES/DRESSINGS) ×1 IMPLANT
GLOVE SURG UNDER POLY LF SZ6.5 (GLOVE) ×1 IMPLANT
GLOVE SURG UNDER POLY LF SZ7 (GLOVE) ×1 IMPLANT
NDL HYPO 18GX1.5 BLUNT FILL (NEEDLE) IMPLANT
NEEDLE HYPO 18GX1.5 BLUNT FILL (NEEDLE) ×2 IMPLANT
PAD ARMBOARD 7.5X6 YLW CONV (MISCELLANEOUS) ×1 IMPLANT
RAYONE EMV US (Intraocular Lens) ×1 IMPLANT
SYR TB 1ML LL NO SAFETY (SYRINGE) ×1 IMPLANT
TAPE SURG TRANSPORE 1 IN (GAUZE/BANDAGES/DRESSINGS) IMPLANT
TAPE SURGICAL TRANSPORE 1 IN (GAUZE/BANDAGES/DRESSINGS) ×2
WATER STERILE IRR 250ML POUR (IV SOLUTION) ×1 IMPLANT

## 2021-04-11 NOTE — Op Note (Signed)
Date of procedure: 04/11/21  Pre-operative diagnosis: Visually significant age-related combined cataract, Left Eye (H25.812)  Post-operative diagnosis: Visually significant age-related combined cataract, Left Eye (H25.812)  Procedure: Removal of cataract via phacoemulsification and insertion of intra-ocular lens Rayner [id number] +20.5D into the capsular bag of the Left Eye  Attending surgeon: Gerda Diss. Guss Farruggia, MD, MA  Anesthesia: MAC, Topical [place]  Complications: None  Estimated Blood Loss: <6m (minimal)  Specimens: None  Implants: As above  Indications:  Visually significant age-related cataract, Left Eye  Procedure:  The patient was seen and identified in the pre-operative area. The operative eye was identified and dilated.  The operative eye was marked.  Topical anesthesia was administered to the operative eye.     The patient was then to the operative suite and placed in the supine position.  A timeout was performed confirming the patient, procedure to be performed, and all other relevant information.   The patient's face was prepped and draped in the usual fashion for intra-ocular surgery.  A lid speculum was placed into the operative eye and the surgical microscope moved into place and focused.  An inferotemporal paracentesis was created using a 20 gauge paracentesis blade.  Shugarcaine was injected into the anterior chamber.  Viscoelastic was injected into the anterior chamber.  A temporal clear-corneal main wound incision was created using a 2.456mmicrokeratome.  A continuous curvilinear capsulorrhexis was initiated using an irrigating cystitome and completed using capsulorrhexis forceps.  Hydrodissection and hydrodeliniation were performed.  Viscoelastic was injected into the anterior chamber.  A phacoemulsification handpiece and a chopper as a second instrument were used to remove the nucleus and epinucleus. The irrigation/aspiration handpiece was used to remove any remaining  cortical material.   The capsular bag was reinflated with viscoelastic, checked, and found to be intact.  The intraocular lens was inserted into the capsular bag.  The irrigation/aspiration handpiece was used to remove any remaining viscoelastic.  The clear corneal wound and paracentesis wounds were then hydrated and checked with Weck-Cels to be watertight.  The lid-speculum was removed.  The drape was removed.  The patient's face was cleaned with a wet and dry 4x4.   Maxitrol was instilled in the eye. A clear shield was taped over the eye. The patient was taken to the post-operative care unit in good condition, having tolerated the procedure well.  Post-Op Instructions: [facility] and will receive all other orders and instructions.

## 2021-04-11 NOTE — Interval H&P Note (Signed)
History and Physical Interval Note:  04/11/2021 12:30 PM  [name]  has presented today for surgery, with the diagnosis of Nuclear sclerotic cataract - Left eye.  The various methods of treatment have been discussed with the patient and family. After consideration of risks, benefits and other options for treatment, the patient has consented to  Procedure(s) with comments: CATARACT EXTRACTION PHACO AND INTRAOCULAR LENS PLACEMENT ([place]) (Left) - left as a surgical intervention.  The patient's history has been reviewed, patient examined, no change in status, stable for surgery.  I have reviewed the patient's chart and labs.  Questions were answered to the patient's satisfaction.     Baruch Goldmann

## 2021-04-11 NOTE — Anesthesia Preprocedure Evaluation (Addendum)
Anesthesia Evaluation  Patient identified by MRN, date of birth, ID band Patient awake    Reviewed: Allergy & Precautions, NPO status , Patient's Chart, lab work & pertinent test results  Airway Mallampati: II  TM Distance: >3 FB Neck ROM: Full    Dental  (+) Partial Upper, Missing   Pulmonary former smoker,    Pulmonary exam normal breath sounds clear to auscultation       Cardiovascular hypertension, + CAD  Normal cardiovascular exam Rhythm:Regular Rate:Normal     Neuro/Psych Depression CVA    GI/Hepatic GERD  ,  Endo/Other  diabetes, Type 2  Renal/GU   negative genitourinary   Musculoskeletal  (+) Arthritis , Osteoarthritis,    Abdominal   Peds negative pediatric ROS (+)  Hematology  (+) anemia ,   Anesthesia Other Findings   Reproductive/Obstetrics                            Anesthesia Physical Anesthesia Plan  ASA: 3  Anesthesia Plan: MAC   Post-op Pain Management:    Induction: Intravenous  PONV Risk Score and Plan:   Airway Management Planned: Nasal Cannula  Additional Equipment:   Intra-op Plan:   Post-operative Plan:   Informed Consent: I have reviewed the patients History and Physical, chart, labs and discussed the procedure including the risks, benefits and alternatives for the proposed anesthesia with the patient or authorized representative who has indicated his/her understanding and acceptance.       Plan Discussed with: CRNA  Anesthesia Plan Comments:         Anesthesia Quick Evaluation

## 2021-04-11 NOTE — Transfer of Care (Signed)
Immediate Anesthesia Transfer of Care Note  Patient: [name]  Procedure(s) Performed: CATARACT EXTRACTION PHACO AND INTRAOCULAR LENS PLACEMENT ([facility]) (Left: Eye)  Patient Location: Short Stay  Anesthesia Type:MAC  Level of Consciousness: awake and alert   Airway & Oxygen Therapy: Patient Spontanous Breathing  Post-op Assessment: Report given to RN and Post -op Vital signs reviewed and stable  Post vital signs: Reviewed and stable  Last Vitals:  Vitals Value Taken Time  BP    Temp    Pulse    Resp    SpO2      Last Pain:  Vitals:   04/11/21 1043  TempSrc: Oral  PainSc: 0-No pain         Complications: No notable events documented.

## 2021-04-11 NOTE — Discharge Instructions (Signed)
Please discharge patient when stable, will follow up today with Dr. Deangleo Passage at the [facility] office immediately following discharge.  Leave shield in place until visit.  All paperwork with discharge instructions will be given at the office.  [facility] Address:  [address]  [place], [place] [address]  

## 2021-04-11 NOTE — Anesthesia Procedure Notes (Addendum)
Procedure Name: MAC Date/Time: 04/11/2021 12:46 PM Performed by: Orlie Dakin, CRNA Pre-anesthesia Checklist: Patient identified, Emergency Drugs available, Suction available and Patient being monitored Patient Re-evaluated:Patient Re-evaluated prior to induction Oxygen Delivery Method: Nasal cannula Placement Confirmation: positive ETCO2

## 2021-04-11 NOTE — Anesthesia Postprocedure Evaluation (Signed)
Anesthesia Post Note  Patient: [name]  Procedure(s) Performed: CATARACT EXTRACTION PHACO AND INTRAOCULAR LENS PLACEMENT ([facility]) (Left: Eye)  Patient location during evaluation: PACU Anesthesia Type: MAC Level of consciousness: awake and alert Pain management: pain level controlled Vital Signs Assessment: post-procedure vital signs reviewed and stable Respiratory status: spontaneous breathing, nonlabored ventilation, respiratory function stable and patient connected to nasal cannula oxygen Cardiovascular status: blood pressure returned to baseline and stable Postop Assessment: no apparent nausea or vomiting Anesthetic complications: no   No notable events documented.   Last Vitals:  Vitals:   04/11/21 1043 04/11/21 1307  BP: (!) 175/58 (!) 191/64  Pulse: (!) 53 (!) 55  Resp: 16 18  Temp: 36.9 C 36.9 C  SpO2: 94% 98%    Last Pain:  Vitals:   04/11/21 1307  TempSrc: Oral  PainSc: 0-No pain                 Nicanor Alcon

## 2021-04-12 ENCOUNTER — Encounter (HOSPITAL_COMMUNITY): Payer: Self-pay | Admitting: Ophthalmology

## 2021-04-16 ENCOUNTER — Other Ambulatory Visit (HOSPITAL_COMMUNITY): Payer: PPO

## 2021-04-23 ENCOUNTER — Encounter: Payer: Self-pay | Admitting: Internal Medicine

## 2021-04-30 ENCOUNTER — Encounter (HOSPITAL_COMMUNITY): Payer: PPO

## 2021-05-04 ENCOUNTER — Ambulatory Visit: Admit: 2021-05-04 | Payer: PPO | Admitting: Ophthalmology

## 2021-05-04 DIAGNOSIS — R142 Eructation: Secondary | ICD-10-CM | POA: Diagnosis not present

## 2021-05-04 DIAGNOSIS — F411 Generalized anxiety disorder: Secondary | ICD-10-CM | POA: Diagnosis not present

## 2021-05-04 DIAGNOSIS — H269 Unspecified cataract: Secondary | ICD-10-CM | POA: Diagnosis not present

## 2021-05-04 DIAGNOSIS — F419 Anxiety disorder, unspecified: Secondary | ICD-10-CM | POA: Diagnosis not present

## 2021-05-04 DIAGNOSIS — R109 Unspecified abdominal pain: Secondary | ICD-10-CM | POA: Diagnosis not present

## 2021-05-04 DIAGNOSIS — R197 Diarrhea, unspecified: Secondary | ICD-10-CM | POA: Diagnosis not present

## 2021-05-04 SURGERY — PHACOEMULSIFICATION, CATARACT, WITH IOL INSERTION
Anesthesia: Monitor Anesthesia Care | Laterality: Right

## 2021-06-05 ENCOUNTER — Other Ambulatory Visit: Payer: Self-pay

## 2021-06-05 ENCOUNTER — Ambulatory Visit (INDEPENDENT_AMBULATORY_CARE_PROVIDER_SITE_OTHER): Payer: PPO | Admitting: Neurology

## 2021-06-05 ENCOUNTER — Encounter: Payer: Self-pay | Admitting: Neurology

## 2021-06-05 VITALS — BP 194/67 | HR 54 | Wt 137.0 lb

## 2021-06-05 DIAGNOSIS — F02A11 Dementia in other diseases classified elsewhere, mild, with agitation: Secondary | ICD-10-CM | POA: Diagnosis not present

## 2021-06-05 DIAGNOSIS — G301 Alzheimer's disease with late onset: Secondary | ICD-10-CM

## 2021-06-05 MED ORDER — MEMANTINE HCL ER 7 MG PO CP24: 28.0000 mg | ORAL_CAPSULE | Freq: Every day | ORAL | Status: DC

## 2021-06-05 NOTE — Progress Notes (Signed)
[facility] [address] [place]. [place] [address] [phone]       OFFICE FOLLOW UP VISIT T NOTE  Ms. GIRTRUDE ENSLIN Date of Birth:  Apr 24, 1934 Medical Record Number:  [id number]   Referring MD:  Malena taylor  Reason for Referral: History of stroke  HPI: Initial visit 08/28/2020:Ms. Lint is a 87 year old pleasant Caucasian lady with past medical history of right thalamic hemorrhage in Apr 2012, syncope, gastroesophageal reflux disease, hypertension, diverticulitis, diabetes, gait difficulties and mild cognitive impairment.  She is seen today for consultation visit for increasing falls and memory impairment.  History is obtained from the patient and husband as well as daughter who accompany her today.  I have also personally reviewed electronic medical records and prior relevant imaging films in PACS.  Patient has had some balance difficulties ongoing since 2012 following her right thalamic hemorrhage but recently has been falling a lot.  She is unable to pinpoint exact reason for the falls and they occur at variable intervals and circumstances.  She is also noticed some worsening of her cognitive impairment and she gets confused and disoriented at times.  She does not remember to use a cane or a walker which she both has but does not not use consistently.  She states she just forgets to use it.  She was recently admitted to [facility] on 08/01/2020 and found to have some confusion and gait imbalance which was thought to be due to metabolic issues.  She was thought to have low-grade UTI but urine cultures were negative.  MRI scan had shown moderate ventriculomegaly as well as cerebral atrophy raising question of NPH but Dr. Merlene Laughter who saw the patient did not feel patient had clinical history to suggest NPH.  Patient underwent extensive work-up including vitamin B12, TSH which were negative and made gradual improvement.  She has been referred to physical  therapy but she has not yet started outpatient therapy but plans to do so soon.  On Mini-Mental status exam testing today she scored 29/30 with only minor deficits.  Patient has not been on Aricept or Namenda like medications but she was seen by me following the previous hemorrhage and mild cognitive impairment was diagnosed even as far back as in 2012.  There is no family Struve dementia.  She denies any recent headaches, seizures, loss of consciousness or new strokelike symptoms.  The patient has Xanax listed as a as needed medication but states that she has been taking it every night to help her sleep. Update 03/01/2021: Patient returns for follow-up after last visit 6 months ago.  She is accompanied by her son and her daughter.  She has been tolerating Aricept well without any side effects.  He continues to haved ifficulties and poor short-term memory.  Patient has not had any improvement even after aricept which she however seems to be tolerating well without any GI or CNS side effects.  She lives with her husband who also is having some cognitive difficulties.  Patient needs some help with the shower but otherwise is independent and uses a cane and ambulate safely.  She has had no falls.  She does get upset easily at times and is grumpy.  She has not had any violent behaviors, delusions or hallucinations.  She has been started on trazodone at night which helps her sleep well..  She had EEG done on 08/31/2020 which was normal.  She did a lot worse on Mini-Mental status exam today and scored 21/30  compared to last visit when she had scored 29/30 Update 06/05/2021: She returns for follow-up after last visit 3 months ago.  She is accompanied by her daughter and son.  Patient is tolerating Namenda 10 mg twice daily well without any side effects.  Family feels her short-term memory and cognitive difficulties seem to be unchanged.  She has had no further worsening.  There has been no delusions, hallucinations or  violent behavior noted though she does get agitated a lot particularly with her husband.  She is noted improvement in the vision after cataract surgery and is now been able to ambulate much better and she is careful and uses a cane.  She has had no falls or injuries.  She does not like to go out a lot.  She has no new complaints.  Her blood pressure is well controlled.  She has no stroke or TIA symptoms. ROS:   14 system review of systems is positive for memory loss, imbalance, falls, confusion, disorientation, irritability,, f and all other systems negative  PMH:  Past Medical History:  Diagnosis Date   Allergic rhinitis    Altered mental status    Per records at [place], bilateral    Cerebral infarct [facility])    Per records at [facility]    Coronary atherosclerosis of native coronary artery    DES LAD 3/12, 99% nondominant RCA, No CAD otherwise, LVEF 65%   Coronary atherosclerosis of native coronary artery    Per records at [facility]    Diabetic retinopathy ([place])    Disorientation, unspecified    Per records at [facility]    Diverticulitis    Essential hypertension    GERD (gastroesophageal reflux disease)    Hemorrhoids    Hyperlipidemia    Major depressive disorder with single episode    Per records at [facility]    Metabolic encephalopathy    Per records at [facility]    Muscle weakness (generalized)    Per records at [facility]    Stroke, hemorrhagic [facility])    Right thalamic hemorrhage 4/12   Syncope    Neurally mediated   Syncope and collapse    Per records at [facility]    Type 2 diabetes mellitus ([place])    Type 2 diabetes mellitus with unspecified diabetic retinopathy without macular edema ([place])    Per records at [facility]    Unspecified dementia without behavioral disturbance    Per records at [place] History:  Social History   Socioeconomic  History   Marital status: Married    Spouse name: Engineer, materials   Number of children: 2   Years of education: 12   Highest education level: Not on file  Occupational History   Occupation: Retired  Tobacco Use   Smoking status: Former    Packs/day: 0.25    Types: Cigarettes    Quit date: 09/02/1988    Years since quitting: 32.7   Smokeless tobacco: Never  Vaping Use   Vaping Use: Never used  Substance and Sexual Activity   Alcohol use: No    Alcohol/week: 0.0 standard drinks   Drug use: No   Sexual activity: Not Currently  Other Topics Concern   Not on file  Social History Narrative   Lives with husband   Right handed   Drinks 1-2 cups caffeine daily   Social Determinants of  Health   Financial Resource Strain: Not on file  Food Insecurity: Not on file  Transportation Needs: Not on file  Physical Activity: Not on file  Stress: Not on file  Social Connections: Not on file  Intimate Partner Violence: Not on file    Medications:   Current Outpatient Medications on File Prior to Visit  Medication Sig Dispense Refill   acetaminophen (TYLENOL) 325 MG tablet Take 2 tablets (650 mg total) by mouth every 4 (four) hours as needed for mild pain, fever or headache (or Fever >/= 101). 12 tablet 1   ALPRAZolam (XANAX) 0.5 MG tablet Take 0.25 mg by mouth daily as needed for anxiety.     alum & mag hydroxide-simeth (MYLANTA) 283-662-94 MG/5ML suspension Take 15 mLs by mouth every 8 (eight) hours. 355 mL 0   amLODipine (NORVASC) 2.5 MG tablet Take 1 tablet (2.5 mg total) by mouth daily. 90 tablet 1   Artificial Tear Solution (SOOTHE XP OP) Place 1 drop into both eyes daily as needed (dry eyes).     aspirin 81 MG tablet Take 1 tablet (81 mg total) by mouth daily with breakfast. 30 tablet 5   diclofenac Sodium (VOLTAREN) 1 % GEL Apply 1 application topically 4 (four) times daily as needed (pain).     donepezil (ARICEPT) 10 MG tablet Take 1 tablet (10 mg total) by mouth at bedtime. Take 1 tablet  daily 90 tablet 3   escitalopram (LEXAPRO) 20 MG tablet Take 1.5 tablets (30 mg total) by mouth daily. 45 tablet 3   ezetimibe (ZETIA) 10 MG tablet TAKE ONE TABLET BY MOUTH DAILY. 90 tablet 0   fluticasone (FLONASE) 50 MCG/ACT nasal spray Place 1 spray into both nostrils daily as needed for allergies or rhinitis.     fluvastatin (LESCOL) 40 MG capsule TAKE 1 CAPSULE BY MOUTH ONCE DAILY. 90 capsule 1   levocetirizine (XYZAL) 5 MG tablet Take 5 mg by mouth daily as needed for allergies.     Melatonin 10 MG TABS Take 10 mg by mouth daily after supper. 1 tablet 1   metoprolol succinate (TOPROL-XL) 25 MG 24 hr tablet TAKE (1) TABLET BY MOUTH AT BEDTIME. NEEDS OFFICE VISIT 90 tablet 1   naphazoline-glycerin (CLEAR EYES REDNESS) 0.012-0.2 % SOLN Place 2 drops into both eyes 4 (four) times daily as needed for eye irritation. 15 mL 1   nitroGLYCERIN (NITROSTAT) 0.4 MG SL tablet Dissolve 1 tablet under tongue every 5 mins up to 3 dose in 15 mins for chest pain. If no relief call 911. 25 tablet 0   omeprazole (PRILOSEC) 20 MG capsule Take 20 mg by mouth daily.     polyethylene glycol (MIRALAX / GLYCOLAX) 17 g packet Take 17 g by mouth daily. 30 each 2   Probiotic Product (PROBIOTIC DAILY PO) Take 1 capsule by mouth daily as needed (stomach issues).     ramipril (ALTACE) 5 MG capsule Take 1 capsule (5 mg total) by mouth daily. 90 capsule 0   traZODone (DESYREL) 50 MG tablet Take 1 tab for 7 days, then increase to 2 tab p.o. qhs for insomnia. 60 tablet 0   UNABLE TO FIND Diet: NAS     Vibegron (GEMTESA) 75 MG TABS Take 75 mg by mouth daily.     No current facility-administered medications on file prior to visit.    Allergies:   Allergies  Allergen Reactions   Macrodantin     Unknown   Penicillins     Unknown reaction  Pravachol [Pravastatin Sodium]     Unknown   Tetanus Toxoids     Unknown     Physical Exam General: Frail elderly Caucasian lady, seated, in no evident distress Head: head  normocephalic and atraumatic.   Neck: supple with no carotid or supraclavicular bruits Cardiovascular: regular rate and rhythm, no murmurs Musculoskeletal: no deformity Skin:  no rash/petichiae Vascular:  Normal pulses all extremities  Neurologic Exam Mental Status: Awake and fully alert. Oriented to place and time. Recent and remote memory intact. Attention span, concentration and fund of knowledge appropriate. Mood and affect appropriate.  Mini-Mental status exam 20/309 last visit 21/30).  Deficits in orientation, recall and copying.  Clock drawing 3/4.  Diminished recall 0/3.  Able to name only 5 animals that can walk on 4 legs.   Cranial Nerves: Fundoscopic exam reveals sharp disc margins. Pupils equal, briskly reactive to light. Extraocular movements full without nystagmus. Visual fields full to confrontation. Hearing mildly diminished bilaterally. Facial sensation intact. Face, tongue, palate moves normally and symmetrically.  Motor: Normal bulk and tone. Normal strength in all tested extremity muscles except diminished fine finger movements on the left and orbits right over left upper extremity.  Mild weakness of left grip.. Sensory.: intact to touch , pinprick , position and vibratory sensation.  Coordination: Rapid alternating movements normal in all extremities. Finger-to-nose and heel-to-shin performed accurately bilaterally. Gait and Station: Arises from chair without difficulty. Stance is slightly stooped gait demonstrates normal stride length and mild iimbalance .  Unable to do tandem walking. Reflexes: 1+ and symmetric. Toes downgoing.     MMSE - Mini Mental State Exam 06/05/2021 03/01/2021 08/28/2020  Orientation to time 3 1 5   Orientation to Place 5 5 5   Registration 3 3 3   Attention/ Calculation 1 5 5   Recall 0 0 2  Language- name 2 objects 2 2 2   Language- repeat 1 1 1   Language- follow 3 step command 3 2 3   Language- read & follow direction 1 1 1   Write a sentence 1 1 1    Copy design 0 0 1  Total score 20 21 29       ASSESSMENT: 87 year old Caucasian lady with increasing recent falls likely multifactorial due to combination of mild cognitive impairment and medication effect and prior stroke.  Recent cognitive suggest progression to early dementia with no significant improvement on Aricept and Namenda.  Remote history of right thalamic hemorrhage in Apr 2012 of hypertensive etiology     PLAN: I had a long discussion with the patient and her daughter and son with regards to her memory loss and cognitive worsening which appears appear stable on the current dosage regimen of Aricept and Namenda which she seems to be tolerating well without side effects .  I recommend trying to increase the dose of Namenda XR to 28 mg to see if she gets any more benefit.  She was also encouraged to increase participation in cognitively challenging activities like solving crossword puzzles, playing bridge and sodoku.  We also discussed memory compensation strategies.  She was advised to use a cane at all times and we discussed fall precautions.  She will return for follow-up in the future in 3 months or call earlier if necessary. She will stay on aspirin for stroke prevention and maintain aggressive risk factor modification with strict control of hypertension with blood pressure goal below 130/90, lipids with LDL cholesterol goal below 70 mg percent.  She will return for follow-up in the future in 3 months  or call earlier if necessary. She will return for follow-up in the future in 3 months or call earlier if necessary.  Greater than 50% time during this 25-minute   visit was spent on counseling and coordination of care about her memory loss and cognitive impairment, falls, imbalance,   and answering questions Antony Contras, MD Note: This document was prepared with digital dictation and possible smart phrase technology. Any transcriptional errors that result from this process are  unintentional.

## 2021-06-05 NOTE — Patient Instructions (Signed)
I had a long discussion with the patient and her daughter and son with regards to her memory loss and cognitive worsening which appears appear stable on the current dosage regimen of Aricept and Namenda which she seems to be tolerating well without side effects .  I recommend trying to increase the dose of Namenda XR to 28 mg to see if she gets any more benefit.  She was also encouraged to increase participation in cognitively challenging activities like solving crossword puzzles, playing bridge and sodoku.  We also discussed memory compensation strategies.  She was advised to use a cane at all times and we discussed fall precautions.  She will return for follow-up in the future in 3 months or call earlier if necessary. She will stay on aspirin for stroke prevention and maintain aggressive risk factor modification with strict control of hypertension with blood pressure goal below 130/90, lipids with LDL cholesterol goal below 70 mg percent.  She will return for follow-up in the future in 3 months or call earlier if necessary.

## 2021-06-19 ENCOUNTER — Encounter: Payer: Self-pay | Admitting: Cardiology

## 2021-06-19 NOTE — Progress Notes (Signed)
Cardiology Office Note  Date: 06/20/2021   ID: [name], DOB 08-06-1934, MRN [id number]  PCP:  Celene Squibb, MD  Cardiologist:  Rozann Lesches, MD Electrophysiologist:  None   Chief Complaint  Patient presents with   Cardiac follow-up     History of Present Illness: [name] is an 87 y.o. female last seen in March 2021.  [name] is here today with family member for a follow-up visit.  [name] does not report any angina symptoms at this time, no nitroglycerin use.  I reviewed her medications which are noted below, [name] reports compliance with therapy and continues to follow with Dr. Nevada Crane.  Her last LDL was well controlled at 68 and I reviewed her ECG from December 2021.  [name] does not report any sudden palpitations or syncope, no recent falls.  Past Medical History:  Diagnosis Date   Allergic rhinitis    Cataracts, bilateral    Coronary atherosclerosis of native coronary artery    DES LAD 3/12, 99% nondominant RCA, No CAD otherwise, LVEF 65%   Dementia ([place])    Diverticulitis    Essential hypertension    GERD (gastroesophageal reflux disease)    Hemorrhoids    Hyperlipidemia    Major depressive disorder with single episode    Metabolic encephalopathy    Stroke, hemorrhagic ([place])    Right thalamic hemorrhage 4/12   Syncope    Neurally mediated   Type 2 diabetes mellitus with unspecified diabetic retinopathy without macular edema [facility])     Past Surgical History:  Procedure Laterality Date   BREAST CYST EXCISION     CARDIAC CATHETERIZATION     CAROTID STENT  11/16/10   CATARACT EXTRACTION W/PHACO Left 04/11/2021   Procedure: CATARACT EXTRACTION PHACO AND INTRAOCULAR LENS PLACEMENT ([place]);  Surgeon: Baruch Goldmann, MD;  Location: [facility];  Service: Ophthalmology;  Laterality: Left;  CDE   11.84   COLONOSCOPY N/A 10/02/2014   Dr. Rourk:engorged internal hemorrhoids/pancolonic diverticulosis   ESOPHAGOGASTRODUODENOSCOPY  10/02/14   Dr. Rourk:normal  esophagus 2 cm hiatal hernia, gastric erosions likely from NSAID effect    Current Outpatient Medications  Medication Sig Dispense Refill   acetaminophen (TYLENOL) 325 MG tablet Take 2 tablets (650 mg total) by mouth every 4 (four) hours as needed for mild pain, fever or headache (or Fever >/= 101). 12 tablet 1   ALPRAZolam (XANAX) 0.5 MG tablet Take 0.25 mg by mouth daily as needed for anxiety.     alum & mag hydroxide-simeth (MYLANTA) 527-782-42 MG/5ML suspension Take 15 mLs by mouth every 8 (eight) hours. 355 mL 0   amLODipine (NORVASC) 2.5 MG tablet Take 1 tablet (2.5 mg total) by mouth daily. 90 tablet 1   Artificial Tear Solution (SOOTHE XP OP) Place 1 drop into both eyes daily as needed (dry eyes).     aspirin 81 MG tablet Take 1 tablet (81 mg total) by mouth daily with breakfast. 30 tablet 5   diclofenac Sodium (VOLTAREN) 1 % GEL Apply 1 application topically 4 (four) times daily as needed (pain).     donepezil (ARICEPT) 10 MG tablet Take 1 tablet (10 mg total) by mouth at bedtime. Take 1 tablet daily 90 tablet 3   escitalopram (LEXAPRO) 20 MG tablet Take 1.5 tablets (30 mg total) by mouth daily. 45 tablet 3   ezetimibe (ZETIA) 10 MG tablet TAKE ONE TABLET BY MOUTH DAILY. 90 tablet 0   fluticasone (FLONASE) 50 MCG/ACT nasal spray Place 1 spray into both  nostrils daily as needed for allergies or rhinitis.     fluvastatin (LESCOL) 40 MG capsule TAKE 1 CAPSULE BY MOUTH ONCE DAILY. 90 capsule 1   levocetirizine (XYZAL) 5 MG tablet Take 5 mg by mouth daily as needed for allergies.     Melatonin 10 MG TABS Take 10 mg by mouth daily after supper. 1 tablet 1   metoprolol succinate (TOPROL-XL) 25 MG 24 hr tablet TAKE (1) TABLET BY MOUTH AT BEDTIME. NEEDS OFFICE VISIT 90 tablet 1   naphazoline-glycerin (CLEAR EYES REDNESS) 0.012-0.2 % SOLN Place 2 drops into both eyes 4 (four) times daily as needed for eye irritation. 15 mL 1   nitroGLYCERIN (NITROSTAT) 0.4 MG SL tablet Dissolve 1 tablet under  tongue every 5 mins up to 3 dose in 15 mins for chest pain. If no relief call 911. 25 tablet 0   omeprazole (PRILOSEC) 20 MG capsule Take 20 mg by mouth daily.     polyethylene glycol (MIRALAX / GLYCOLAX) 17 g packet Take 17 g by mouth daily. 30 each 2   Probiotic Product (PROBIOTIC DAILY PO) Take 1 capsule by mouth daily as needed (stomach issues).     ramipril (ALTACE) 5 MG capsule Take 1 capsule (5 mg total) by mouth daily. 90 capsule 0   traZODone (DESYREL) 50 MG tablet Take 1 tab for 7 days, then increase to 2 tab p.o. qhs for insomnia. 60 tablet 0   UNABLE TO FIND Diet: NAS     Vibegron (GEMTESA) 75 MG TABS Take 75 mg by mouth daily.     Current Facility-Administered Medications  Medication Dose Route Frequency Provider Last Rate Last Admin   memantine (NAMENDA XR) 24 hr capsule 28 mg  28 mg Oral Daily Garvin Fila, MD       Allergies:  Macrodantin, Penicillins, Pravachol [pravastatin sodium], and Tetanus toxoids   ROS: No orthopnea or PND.  Physical Exam: VS:  BP 136/68   Pulse 60   Ht 5\' 2"  (1.575 m)   Wt 136 lb 3.2 oz (61.8 kg)   SpO2 97%   BMI 24.91 kg/m , BMI Body mass index is 24.91 kg/m.  Wt Readings from Last 3 Encounters:  06/20/21 136 lb 3.2 oz (61.8 kg)  06/05/21 137 lb (62.1 kg)  04/05/21 138 lb 14.2 oz (63 kg)    General: Patient appears comfortable at rest. HEENT: Conjunctiva and lids normal, wearing a mask. Neck: Supple, no elevated JVP or carotid bruits, no thyromegaly. Lungs: Clear to auscultation, nonlabored breathing at rest. Cardiac: Regular rate and rhythm, no S3, 1/6 systolic murmur. Extremities: No pitting edema.  ECG:  An ECG dated 08/02/2020 was personally reviewed today and demonstrated:  Sinus bradycardia.  Recent Labwork: 08/01/2020: TSH 3.052 08/02/2020: Platelets 291 11/02/2020: ALT 22; AST 24; BUN 12; Hemoglobin 12.6; Potassium 4.6; Sodium 137 04/09/2021: Creatinine, Ser 1.00     Component Value Date/Time   CHOL 139 08/02/2020 0627    CHOL 179 07/25/2020 1146   TRIG 99 08/02/2020 0627   HDL 50 08/02/2020 0627   HDL 46 07/25/2020 1146   CHOLHDL 2.8 08/02/2020 0627   VLDL 20 08/02/2020 0627   LDLCALC 69 08/02/2020 0627   LDLCALC 95 07/25/2020 1146  June 2022: Hemoglobin A1c 5.9%  Other Studies Reviewed Today:  Lexiscan Myoview 05/16/2015: There was no ST segment deviation noted during stress. No significant defects to suggest scar or ischemia. This is a low risk study. Nuclear stress EF: 58%.  Carotid Dopplers 12/02/2019: IMPRESSION: 1.  Bilateral carotid bifurcation plaque resulting in less than 50% diameter ICA stenosis. 2. Origin stenosis of the right external carotid artery, of doubtful clinical significance. 3. Antegrade bilateral vertebral arterial flow.  Assessment and Plan:  1.  CAD status post DES to the LAD in 2012 with nondominant RCA disease that has been managed medically over time.  [name] is doing well without active angina on medical therapy, no recent nitroglycerin use.  Continue observation on aspirin, Altace, Toprol-XL, Norvasc, and Lescol.  2.  Mixed hyperlipidemia, on Lescol.  Last LDL 69.  3.  Nonobstructive carotid artery disease.  Continue aspirin and statin.  Medication Adjustments/Labs and Tests Ordered: Current medicines are reviewed at length with the patient today.  Concerns regarding medicines are outlined above.   Tests Ordered: Orders Placed This Encounter  Procedures   Flu Vaccine QUAD High Dose(Fluad)     Medication Changes: No orders of the defined types were placed in this encounter.   Disposition:  Follow up  1 year.  Signed, Satira Sark, MD, [facility] 06/20/2021 2:02 PM    [facility] [address] , [place] [address] Phone: [phone]; Fax: [phone]

## 2021-06-20 ENCOUNTER — Other Ambulatory Visit: Payer: Self-pay

## 2021-06-20 ENCOUNTER — Encounter: Payer: Self-pay | Admitting: Cardiology

## 2021-06-20 ENCOUNTER — Ambulatory Visit (INDEPENDENT_AMBULATORY_CARE_PROVIDER_SITE_OTHER): Payer: PPO | Admitting: Cardiology

## 2021-06-20 VITALS — BP 136/68 | HR 60 | Ht 62.0 in | Wt 136.2 lb

## 2021-06-20 DIAGNOSIS — E782 Mixed hyperlipidemia: Secondary | ICD-10-CM

## 2021-06-20 DIAGNOSIS — Z23 Encounter for immunization: Secondary | ICD-10-CM | POA: Diagnosis not present

## 2021-06-20 DIAGNOSIS — I25119 Atherosclerotic heart disease of native coronary artery with unspecified angina pectoris: Secondary | ICD-10-CM | POA: Diagnosis not present

## 2021-06-20 NOTE — Patient Instructions (Signed)
Medication Instructions:  Your physician recommends that you continue on your current medications as directed. Please refer to the Current Medication list given to you today.  *If you need a refill on your cardiac medications before your next appointment, please call your pharmacy*   Lab Work: None If you have labs (blood work) drawn today and your tests are completely normal, you will receive your results only by: [place] (if you have MyChart) OR A paper copy in the mail If you have any lab test that is abnormal or we need to change your treatment, we will call you to review the results.   Testing/Procedures: None   Follow-Up: At [facility], you and your health needs are our priority.  As part of our continuing mission to provide you with exceptional heart care, we have created designated Provider Care Teams.  These Care Teams include your primary Cardiologist (physician) and Advanced Practice Providers (APPs -  Physician Assistants and Nurse Practitioners) who all work together to provide you with the care you need, when you need it.  We recommend signing up for the patient portal called "MyChart".  Sign up information is provided on this After Visit Summary.  MyChart is used to connect with patients for Virtual Visits (Telemedicine).  Patients are able to view lab/test results, encounter notes, upcoming appointments, etc.  Non-urgent messages can be sent to your provider as well.   To learn more about what you can do with MyChart, go to [email or web address].    Your next appointment:   1 year(s)  The format for your next appointment:   In Person  Provider:   Rozann Lesches, MD   Other Instructions

## 2021-06-27 ENCOUNTER — Other Ambulatory Visit: Payer: Self-pay | Admitting: Neurology

## 2021-07-19 DIAGNOSIS — J069 Acute upper respiratory infection, unspecified: Secondary | ICD-10-CM | POA: Diagnosis not present

## 2021-07-23 ENCOUNTER — Other Ambulatory Visit: Payer: Self-pay | Admitting: Neurology

## 2021-07-23 ENCOUNTER — Telehealth: Payer: Self-pay | Admitting: Neurology

## 2021-07-23 NOTE — Telephone Encounter (Signed)
Beth from [place] called stating they never received the new dosage of the memantine (NAMENDA XR) 24 hr capsule 28 mg, asking to have the new prescription to be resent to them.

## 2021-07-24 ENCOUNTER — Other Ambulatory Visit: Payer: Self-pay | Admitting: Neurology

## 2021-07-24 MED ORDER — MEMANTINE HCL ER 28 MG PO CP24: 28.0000 mg | ORAL_CAPSULE | Freq: Every day | ORAL | 0 refills | Status: DC

## 2021-07-24 NOTE — Telephone Encounter (Signed)
Per office visit on 06/05/21, Dr. Leonie Man noted the following:  I recommend trying to increase the dose of Namenda XR to 28 mg to see if she gets any more benefit.    Prescription sent to pharmacy.

## 2021-07-24 NOTE — Addendum Note (Signed)
Addended by: Noberto Retort C on: 07/24/2021 11:22 AM   Modules accepted: Orders

## 2021-08-13 ENCOUNTER — Other Ambulatory Visit: Payer: Self-pay | Admitting: Neurology

## 2021-08-13 DIAGNOSIS — E782 Mixed hyperlipidemia: Secondary | ICD-10-CM | POA: Diagnosis not present

## 2021-08-13 DIAGNOSIS — R7303 Prediabetes: Secondary | ICD-10-CM | POA: Diagnosis not present

## 2021-08-16 DIAGNOSIS — I10 Essential (primary) hypertension: Secondary | ICD-10-CM | POA: Diagnosis not present

## 2021-08-16 DIAGNOSIS — F0390 Unspecified dementia without behavioral disturbance: Secondary | ICD-10-CM | POA: Diagnosis not present

## 2021-08-16 DIAGNOSIS — K5750 Diverticulosis of both small and large intestine without perforation or abscess without bleeding: Secondary | ICD-10-CM | POA: Diagnosis not present

## 2021-08-16 DIAGNOSIS — R269 Unspecified abnormalities of gait and mobility: Secondary | ICD-10-CM | POA: Diagnosis not present

## 2021-08-16 DIAGNOSIS — Z8673 Personal history of transient ischemic attack (TIA), and cerebral infarction without residual deficits: Secondary | ICD-10-CM | POA: Diagnosis not present

## 2021-08-16 DIAGNOSIS — R7989 Other specified abnormal findings of blood chemistry: Secondary | ICD-10-CM | POA: Diagnosis not present

## 2021-08-16 DIAGNOSIS — Z955 Presence of coronary angioplasty implant and graft: Secondary | ICD-10-CM | POA: Diagnosis not present

## 2021-08-16 DIAGNOSIS — R142 Eructation: Secondary | ICD-10-CM | POA: Diagnosis not present

## 2021-08-16 DIAGNOSIS — R7303 Prediabetes: Secondary | ICD-10-CM | POA: Diagnosis not present

## 2021-08-16 DIAGNOSIS — E782 Mixed hyperlipidemia: Secondary | ICD-10-CM | POA: Diagnosis not present

## 2021-08-16 DIAGNOSIS — G4701 Insomnia due to medical condition: Secondary | ICD-10-CM | POA: Diagnosis not present

## 2021-08-16 DIAGNOSIS — R944 Abnormal results of kidney function studies: Secondary | ICD-10-CM | POA: Diagnosis not present

## 2021-09-06 ENCOUNTER — Ambulatory Visit: Payer: PPO | Admitting: Orthopedic Surgery

## 2021-09-07 ENCOUNTER — Ambulatory Visit: Payer: PPO | Admitting: Gastroenterology

## 2021-09-13 ENCOUNTER — Other Ambulatory Visit: Payer: Self-pay

## 2021-09-13 ENCOUNTER — Ambulatory Visit (INDEPENDENT_AMBULATORY_CARE_PROVIDER_SITE_OTHER): Payer: Medicare PPO | Admitting: Orthopedic Surgery

## 2021-09-13 DIAGNOSIS — M1710 Unilateral primary osteoarthritis, unspecified knee: Secondary | ICD-10-CM

## 2021-09-13 DIAGNOSIS — M25561 Pain in right knee: Secondary | ICD-10-CM

## 2021-09-13 DIAGNOSIS — G8929 Other chronic pain: Secondary | ICD-10-CM

## 2021-09-13 DIAGNOSIS — M1711 Unilateral primary osteoarthritis, right knee: Secondary | ICD-10-CM

## 2021-09-13 NOTE — Progress Notes (Signed)
Chief Complaint  Patient presents with   Knee Pain    RT/follow up/knee about the same per patient   88 year old female with coronary artery disease and dementia presents with continued chronic pain right knee  She received an injection in July and got good pain relief  She was also started on some anti-inflammatories  Based on her medical history and dementia recommend repeat intra-articular injection right knee  Encounter Diagnoses  Name Primary?   Primary localized osteoarthritis of knee Yes   Chronic pain of right knee     Procedure note right knee injection   verbal consent was obtained to inject right knee joint  Timeout was completed to confirm the site of injection  The medications used were Depo-Medrol 40 mg with lidocaine 1% 3 cc anesthesia was provided by ethyl chloride and the skin was prepped with alcohol.  After cleaning the skin with alcohol a 20-gauge needle was used to inject the right knee joint. There were no complications. A sterile bandage was applied.

## 2021-09-26 ENCOUNTER — Ambulatory Visit: Payer: Medicare PPO | Admitting: Neurology

## 2021-09-26 VITALS — BP 185/61 | HR 52 | Ht 62.0 in | Wt 139.0 lb

## 2021-09-26 DIAGNOSIS — F02A0 Dementia in other diseases classified elsewhere, mild, without behavioral disturbance, psychotic disturbance, mood disturbance, and anxiety: Secondary | ICD-10-CM

## 2021-09-26 DIAGNOSIS — G301 Alzheimer's disease with late onset: Secondary | ICD-10-CM

## 2021-09-26 NOTE — Patient Instructions (Signed)
I had a long discussion with the patient and her son with regards to her memory loss and cognitive worsening which appears appear stable on the current dosage regimen of Aricept 10 mg and Namenda XR 28 mg which she seems to be tolerating well without side effects .Marland Kitchen  She was also encouraged to increase participation in cognitively challenging activities like solving crossword puzzles, playing bridge and sodoku.  We also discussed memory compensation strategies.  She was advised to use a cane at all times and we discussed fall precautions.  She will return for follow-up in the future in 3 months or call earlier if necessary. She will stay on aspirin for stroke prevention and maintain aggressive risk factor modification with strict control of hypertension with blood pressure goal below 130/90, lipids with LDL cholesterol goal below 70 mg percent.  She will return for follow-up in the future in 3 months or call earlier if necessary. She will return for follow-up in the future in 6 months or call earlier if necessary.

## 2021-09-26 NOTE — Progress Notes (Signed)
[facility] [address] [place]. [place] [address] [phone]       OFFICE FOLLOW UP VISIT T NOTE  [name] Date of Birth:  08/17/1934 Medical Record Number:  [id number]   Referring MD:  Malena taylor  Reason for Referral: History of stroke  HPI: Initial visit 08/28/2020:[name] is a 88 year old pleasant Caucasian lady with past medical history of right thalamic hemorrhage in Apr 2012, syncope, gastroesophageal reflux disease, hypertension, diverticulitis, diabetes, gait difficulties and mild cognitive impairment.  She is seen today for consultation visit for increasing falls and memory impairment.  History is obtained from the patient and husband as well as daughter who accompany her today.  I have also personally reviewed electronic medical records and prior relevant imaging films in PACS.  Patient has had some balance difficulties ongoing since 2012 following her right thalamic hemorrhage but recently has been falling a lot.  She is unable to pinpoint exact reason for the falls and they occur at variable intervals and circumstances.  She is also noticed some worsening of her cognitive impairment and she gets confused and disoriented at times.  She does not remember to use a cane or a walker which she both has but does not not use consistently.  She states she just forgets to use it.  She was recently admitted to [facility] on 08/01/2020 and found to have some confusion and gait imbalance which was thought to be due to metabolic issues.  She was thought to have low-grade UTI but urine cultures were negative.  MRI scan had shown moderate ventriculomegaly as well as cerebral atrophy raising question of NPH but Dr. Merlene Laughter who saw the patient did not feel patient had clinical history to suggest NPH.  Patient underwent extensive work-up including vitamin B12, TSH which were negative and made gradual improvement.  She has been referred to physical  therapy but she has not yet started outpatient therapy but plans to do so soon.  On Mini-Mental status exam testing today she scored 29/30 with only minor deficits.  Patient has not been on Aricept or Namenda like medications but she was seen by me following the previous hemorrhage and mild cognitive impairment was diagnosed even as far back as in 2012.  There is no family Struve dementia.  She denies any recent headaches, seizures, loss of consciousness or new strokelike symptoms.  The patient has Xanax listed as a as needed medication but states that she has been taking it every night to help her sleep. Update 03/01/2021: Patient returns for follow-up after last visit 6 months ago.  She is accompanied by her son and her daughter.  She has been tolerating Aricept well without any side effects.  He continues to haved ifficulties and poor short-term memory.  Patient has not had any improvement even after aricept which she however seems to be tolerating well without any GI or CNS side effects.  She lives with her husband who also is having some cognitive difficulties.  Patient needs some help with the shower but otherwise is independent and uses a cane and ambulate safely.  She has had no falls.  She does get upset easily at times and is grumpy.  She has not had any violent behaviors, delusions or hallucinations.  She has been started on trazodone at night which helps her sleep well..  She had EEG done on 08/31/2020 which was normal.  She did a lot worse on Mini-Mental status exam today and scored 21/30  compared to last visit when she had scored 29/30 Update 06/05/2021: She returns for follow-up after last visit 3 months ago.  She is accompanied by her daughter and son.  Patient is tolerating Namenda 10 mg twice daily well without any side effects.  Family feels her short-term memory and cognitive difficulties seem to be unchanged.  She has had no further worsening.  There has been no delusions, hallucinations or  violent behavior noted though she does get agitated a lot particularly with her husband.  She is noted improvement in the vision after cataract surgery and is now been able to ambulate much better and she is careful and uses a cane.  She has had no falls or injuries.  She does not like to go out a lot.  She has no new complaints.  Her blood pressure is well controlled.  She has no stroke or TIA symptoms. Update 09/26/2021 : She returns for follow-up after last visit 3 months ago.  She is accompanied by her son.  Patient feels she is noticing cognitive improvement after increasing the dose of Namenda to 28 mg daily which is tolerating well without any new side effects.  She also remains on Aricept 10 mg daily which she is tolerating well as well.  Patient has been participating and doing regular sodoku.  She continues to live at home with her husband.  Short-term memory remains poor.  She does not have any delusions, hallucinations, agitation or unsafe behavior.  She ambulates with a walker.  She has had no falls or injuries.  She remains on aspirin for stroke prevention and states her blood pressure is under good control though today it is elevated in office at 185/61.  She has had no recurrent stroke or TIA symptoms. ROS:   14 system review of systems is positive for memory loss, imbalance, falls, confusion, disorientation, irritability,, and all other systems negative  PMH:  Past Medical History:  Diagnosis Date   Allergic rhinitis    Cataracts, bilateral    Coronary atherosclerosis of native coronary artery    DES LAD 3/12, 99% nondominant RCA, No CAD otherwise, LVEF 65%   Dementia (HCC)    Diverticulitis    Essential hypertension    GERD (gastroesophageal reflux disease)    Hemorrhoids    Hyperlipidemia    Major depressive disorder with single episode    Metabolic encephalopathy    Stroke, hemorrhagic ([place])    Right thalamic hemorrhage 4/12   Syncope    Neurally mediated   Type 2 diabetes  mellitus with unspecified diabetic retinopathy without macular edema (HCC)     Social History:  Social History   Socioeconomic History   Marital status: Married    Spouse name: Engineer, materials   Number of children: 2   Years of education: 12   Highest education level: Not on file  Occupational History   Occupation: Retired  Tobacco Use   Smoking status: Former    Packs/day: 0.25    Types: Cigarettes    Quit date: 09/02/1988    Years since quitting: 33.0   Smokeless tobacco: Never  Vaping Use   Vaping Use: Never used  Substance and Sexual Activity   Alcohol use: No    Alcohol/week: 0.0 standard drinks   Drug use: No   Sexual activity: Not Currently  Other Topics Concern   Not on file  Social History Narrative   Lives with husband   Right handed   Drinks 1-2 cups caffeine daily  Social Determinants of Health   Financial Resource Strain: Not on file  Food Insecurity: Not on file  Transportation Needs: Not on file  Physical Activity: Not on file  Stress: Not on file  Social Connections: Not on file  Intimate Partner Violence: Not on file    Medications:   Current Outpatient Medications on File Prior to Visit  Medication Sig Dispense Refill   acetaminophen (TYLENOL) 325 MG tablet Take 2 tablets (650 mg total) by mouth every 4 (four) hours as needed for mild pain, fever or headache (or Fever >/= 101). 12 tablet 1   ALPRAZolam (XANAX) 0.5 MG tablet Take 0.25 mg by mouth daily as needed for anxiety.     alum & mag hydroxide-simeth (MYLANTA) 453-646-80 MG/5ML suspension Take 15 mLs by mouth every 8 (eight) hours. 355 mL 0   amLODipine (NORVASC) 2.5 MG tablet Take 1 tablet (2.5 mg total) by mouth daily. 90 tablet 1   Artificial Tear Solution (SOOTHE XP OP) Place 1 drop into both eyes daily as needed (dry eyes).     aspirin 81 MG tablet Take 1 tablet (81 mg total) by mouth daily with breakfast. 30 tablet 5   diclofenac Sodium (VOLTAREN) 1 % GEL Apply 1 application topically 4 (four)  times daily as needed (pain).     donepezil (ARICEPT) 10 MG tablet Take 1 tablet (10 mg total) by mouth at bedtime. Take 1 tablet daily 90 tablet 3   escitalopram (LEXAPRO) 20 MG tablet Take 1.5 tablets (30 mg total) by mouth daily. 45 tablet 3   ezetimibe (ZETIA) 10 MG tablet TAKE ONE TABLET BY MOUTH DAILY. 90 tablet 0   fluticasone (FLONASE) 50 MCG/ACT nasal spray Place 1 spray into both nostrils daily as needed for allergies or rhinitis.     fluvastatin (LESCOL) 40 MG capsule TAKE 1 CAPSULE BY MOUTH ONCE DAILY. 90 capsule 1   levocetirizine (XYZAL) 5 MG tablet Take 5 mg by mouth daily as needed for allergies.     Melatonin 10 MG TABS Take 10 mg by mouth daily after supper. 1 tablet 1   memantine (NAMENDA XR) 28 MG CP24 24 hr capsule Take 1 capsule (28 mg total) by mouth daily. 90 capsule 0   metoprolol succinate (TOPROL-XL) 25 MG 24 hr tablet TAKE (1) TABLET BY MOUTH AT BEDTIME. NEEDS OFFICE VISIT 90 tablet 1   naphazoline-glycerin (CLEAR EYES REDNESS) 0.012-0.2 % SOLN Place 2 drops into both eyes 4 (four) times daily as needed for eye irritation. 15 mL 1   nitroGLYCERIN (NITROSTAT) 0.4 MG SL tablet Dissolve 1 tablet under tongue every 5 mins up to 3 dose in 15 mins for chest pain. If no relief call 911. 25 tablet 0   omeprazole (PRILOSEC) 20 MG capsule Take 20 mg by mouth daily.     polyethylene glycol (MIRALAX / GLYCOLAX) 17 g packet Take 17 g by mouth daily. 30 each 2   Probiotic Product (PROBIOTIC DAILY PO) Take 1 capsule by mouth daily as needed (stomach issues).     ramipril (ALTACE) 5 MG capsule Take 1 capsule (5 mg total) by mouth daily. 90 capsule 0   traZODone (DESYREL) 50 MG tablet Take 1 tab for 7 days, then increase to 2 tab p.o. qhs for insomnia. 60 tablet 0   UNABLE TO FIND Diet: NAS     Vibegron (GEMTESA) 75 MG TABS Take 75 mg by mouth daily.     Current Facility-Administered Medications on File Prior to Visit  Medication Dose  Route Frequency Provider Last Rate Last Admin    memantine (NAMENDA XR) 24 hr capsule 28 mg  28 mg Oral Daily Garvin Fila, MD        Allergies:   Allergies  Allergen Reactions   Macrodantin     Unknown   Penicillins     Unknown reaction   Pravachol [Pravastatin Sodium]     Unknown   Tetanus Toxoids     Unknown     Physical Exam General: Frail elderly Caucasian lady, seated, in no evident distress Head: head normocephalic and atraumatic.   Neck: supple with no carotid or supraclavicular bruits Cardiovascular: regular rate and rhythm, no murmurs Musculoskeletal: no deformity Skin:  no rash/petichiae Vascular:  Normal pulses all extremities  Neurologic Exam Mental Status: Awake and fully alert. Oriented to place and time. Recent and remote memory intact. Attention span, concentration and fund of knowledge appropriate. Mood and affect appropriate.  Mini-Mental status exam 21/309last visit 20/30).  Deficits in orientation, recall and copying.  Clock drawing 3/4.  Diminished recall 0/3.  Able to name only 5 animals that can walk on 4 legs.   Cranial Nerves: Fundoscopic exam not done. Pupils equal, briskly reactive to light. Extraocular movements full without nystagmus. Visual fields full to confrontation. Hearing mildly diminished bilaterally. Facial sensation intact. Face, tongue, palate moves normally and symmetrically.  Motor: Normal bulk and tone. Normal strength in all tested extremity muscles except diminished fine finger movements on the left and orbits right over left upper extremity.  Mild weakness of left grip.. Sensory.: intact to touch , pinprick , position and vibratory sensation.  Coordination: Rapid alternating movements normal in all extremities. Finger-to-nose and heel-to-shin performed accurately bilaterally. Gait and Station: Arises from chair without difficulty. Stance is slightly stooped gait demonstrates normal stride length and mild iimbalance .  Unable to do tandem walking. Reflexes: 1+ and symmetric. Toes  downgoing.     MMSE - Mini Mental State Exam 09/26/2021 06/05/2021 03/01/2021  Orientation to time 3 3 1   Orientation to Place 5 5 5   Registration 3 3 3   Attention/ Calculation 1 1 5   Recall 0 0 0  Language- name 2 objects 2 2 2   Language- repeat 1 1 1   Language- follow 3 step command 3 3 2   Language- read & follow direction 1 1 1   Write a sentence 1 1 1   Copy design 1 0 0  Total score 21 20 21       ASSESSMENT: 88 year old Caucasian lady with increasing recent falls likely multifactorial due to combination of mild cognitive impairment and medication effect and prior stroke.  Recent cognitive suggest progression to early dementia with no significant improvement on Aricept and Namenda.  Remote history of right thalamic hemorrhage in Apr 2012 of hypertensive etiology     PLAN: I had a long discussion with the patient and her son with regards to her memory loss and cognitive worsening which appears appear stable on the current dosage regimen of Aricept 10 mg and Namenda XR 28 mg which she seems to be tolerating well without side effects .Marland Kitchen  She was also encouraged to increase participation in cognitively challenging activities like solving crossword puzzles, playing bridge and sodoku.  We also discussed memory compensation strategies.  She was advised to use a cane at all times and we discussed fall precautions.  She will return for follow-up in the future in 3 months or call earlier if necessary. She will stay on aspirin for stroke prevention and  maintain aggressive risk factor modification with strict control of hypertension with blood pressure goal below 130/90, lipids with LDL cholesterol goal below 70 mg percent.  She will return for follow-up in the future in 3 months or call earlier if necessary. She will return for follow-up in the future in 6 months or call earlier if necessary.Greater than 50% time during this 35-minute   visit was spent on counseling and coordination of care about her  memory loss and cognitive impairment, falls, imbalance,   and answering questions Antony Contras, MD Note: This document was prepared with digital dictation and possible smart phrase technology. Any transcriptional errors that result from this process are unintentional.

## 2021-10-23 ENCOUNTER — Other Ambulatory Visit: Payer: Self-pay | Admitting: Neurology

## 2021-11-12 ENCOUNTER — Other Ambulatory Visit: Payer: Self-pay | Admitting: Neurology

## 2022-03-26 ENCOUNTER — Encounter: Payer: Self-pay | Admitting: Neurology

## 2022-03-26 ENCOUNTER — Ambulatory Visit: Payer: Medicare PPO | Admitting: Neurology

## 2022-03-26 VITALS — BP 166/67 | HR 56 | Ht 61.0 in | Wt 140.2 lb

## 2022-03-26 DIAGNOSIS — F0280 Dementia in other diseases classified elsewhere without behavioral disturbance: Secondary | ICD-10-CM

## 2022-03-26 DIAGNOSIS — R413 Other amnesia: Secondary | ICD-10-CM

## 2022-03-26 DIAGNOSIS — F0150 Vascular dementia without behavioral disturbance: Secondary | ICD-10-CM | POA: Diagnosis not present

## 2022-03-26 DIAGNOSIS — G309 Alzheimer's disease, unspecified: Secondary | ICD-10-CM | POA: Diagnosis not present

## 2022-03-26 NOTE — Progress Notes (Signed)
[facility] [address] [place]. [place] [address] [phone]       OFFICE FOLLOW UP VISIT T NOTE  [name] Date of Birth:  Apr 24, 1934 Medical Record Number:  [id number]   Referring MD:  [name]  Reason for Referral: History of stroke  HPI: Initial visit 08/28/2020:[name] is a 88 year old pleasant Caucasian lady with past medical history of right thalamic hemorrhage in Apr 2012, syncope, gastroesophageal reflux disease, hypertension, diverticulitis, diabetes, gait difficulties and mild cognitive impairment.  She is seen today for consultation visit for increasing falls and memory impairment.  History is obtained from the patient and husband as well as daughter who accompany her today.  I have also personally reviewed electronic medical records and prior relevant imaging films in PACS.  Patient has had some balance difficulties ongoing since 2012 following her right thalamic hemorrhage but recently has been falling a lot.  She is unable to pinpoint exact reason for the falls and they occur at variable intervals and circumstances.  She is also noticed some worsening of her cognitive impairment and she gets confused and disoriented at times.  She does not remember to use a cane or a walker which she both has but does not not use consistently.  She states she just forgets to use it.  She was recently admitted to [facility] on 08/01/2020 and found to have some confusion and gait imbalance which was thought to be due to metabolic issues.  She was thought to have low-grade UTI but urine cultures were negative.  MRI scan had shown moderate ventriculomegaly as well as cerebral atrophy raising question of NPH but Dr. Merlene Laughter who saw the patient did not feel patient had clinical history to suggest NPH.  Patient underwent extensive work-up including vitamin B12, TSH which were negative and made gradual improvement.  She has been referred to physical  therapy but she has not yet started outpatient therapy but plans to do so soon.  On Mini-Mental status exam testing today she scored 29/30 with only minor deficits.  Patient has not been on Aricept or Namenda like medications but she was seen by me following the previous hemorrhage and mild cognitive impairment was diagnosed even as far back as in 2012.  There is no family Struve dementia.  She denies any recent headaches, seizures, loss of consciousness or new strokelike symptoms.  The patient has Xanax listed as a as needed medication but states that she has been taking it every night to help her sleep. Update 03/01/2021: Patient returns for follow-up after last visit 6 months ago.  She is accompanied by her son and her daughter.  She has been tolerating Aricept well without any side effects.  He continues to haved ifficulties and poor short-term memory.  Patient has not had any improvement even after aricept which she however seems to be tolerating well without any GI or CNS side effects.  She lives with her husband who also is having some cognitive difficulties.  Patient needs some help with the shower but otherwise is independent and uses a cane and ambulate safely.  She has had no falls.  She does get upset easily at times and is grumpy.  She has not had any violent behaviors, delusions or hallucinations.  She has been started on trazodone at night which helps her sleep well..  She had EEG done on 08/31/2020 which was normal.  She did a lot worse on Mini-Mental status exam today and scored 21/30  compared to last visit when she had scored 29/30 Update 06/05/2021: She returns for follow-up after last visit 3 months ago.  She is accompanied by her daughter and son.  Patient is tolerating Namenda 10 mg twice daily well without any side effects.  Family feels her short-term memory and cognitive difficulties seem to be unchanged.  She has had no further worsening.  There has been no delusions, hallucinations or  violent behavior noted though she does get agitated a lot particularly with her husband.  She is noted improvement in the vision after cataract surgery and is now been able to ambulate much better and she is careful and uses a cane.  She has had no falls or injuries.  She does not like to go out a lot.  She has no new complaints.  Her blood pressure is well controlled.  She has no stroke or TIA symptoms. Update 09/26/2021 : She returns for follow-up after last visit 3 months ago.  She is accompanied by her son.  Patient feels she is noticing cognitive improvement after increasing the dose of Namenda to 28 mg daily which is tolerating well without any new side effects.  She also remains on Aricept 10 mg daily which she is tolerating well as well.  Patient has been participating and doing regular sodoku.  She continues to live at home with her husband.  Short-term memory remains poor.  She does not have any delusions, hallucinations, agitation or unsafe behavior.  She ambulates with a walker.  She has had no falls or injuries.  She remains on aspirin for stroke prevention and states her blood pressure is under good control though today it is elevated in office at 185/61.  She has had no recurrent stroke or TIA symptoms. Update 03/26/2022 : She returns for follow-up after last visit 6 months ago.  She is accompanied by her daughter.  She continues to have memory and cognitive difficulties which she feels are unchanged.  Patient is still living at home with her husband.  She needs some help in the shower.  She has not been cooking or driving.  She is mostly independent and is able to attend to her needs.  She does do some word searches but does not otherwise do any cognitively challenging activities.  The daughter has not noticed any delusions, hallucinations, unsafe behavior or agitation.  Patient sleeps well.  She is able to ambulate independently.  She has had no falls or injuries.  On the Mini-Mental status exam  today she scored 16/30 which was actually declined from 21/30 at last visit.  She is already on maximum dose Namenda XR 28 mg daily and Aricept 10 mg daily.  She remains on aspirin she is tolerating well without bruising or bleeding.  Her blood pressure is usually better at home though today it is elevated in office at 166/67.  She has had no recurrent stroke or TIA symptoms. ROS:   14 system review of systems is positive for memory loss, imbalance, falls, confusion, disorientation, irritability,, and all other systems negative  PMH:  Past Medical History:  Diagnosis Date   Allergic rhinitis    Cataracts, bilateral    Coronary atherosclerosis of native coronary artery    DES LAD 3/12, 99% nondominant RCA, No CAD otherwise, LVEF 65%   Dementia ([place])    Diverticulitis    Essential hypertension    GERD (gastroesophageal reflux disease)    Hemorrhoids    Hyperlipidemia    Major depressive disorder  with single episode    Metabolic encephalopathy    Stroke, hemorrhagic ([place])    Right thalamic hemorrhage 4/12   Syncope    Neurally mediated   Type 2 diabetes mellitus with unspecified diabetic retinopathy without macular edema (HCC)     Social History:  Social History   Socioeconomic History   Marital status: Married    Spouse name: Engineer, materials   Number of children: 2   Years of education: 12   Highest education level: Not on file  Occupational History   Occupation: Retired  Tobacco Use   Smoking status: Former    Packs/day: 0.25    Types: Cigarettes    Quit date: 09/02/1988    Years since quitting: 33.5   Smokeless tobacco: Never  Vaping Use   Vaping Use: Never used  Substance and Sexual Activity   Alcohol use: No    Alcohol/week: 0.0 standard drinks of alcohol   Drug use: No   Sexual activity: Not Currently  Other Topics Concern   Not on file  Social History Narrative   Lives with husband   Right handed   Drinks 1-2 cups caffeine daily   Social Determinants of Health    Financial Resource Strain: Not on file  Food Insecurity: Not on file  Transportation Needs: Not on file  Physical Activity: Not on file  Stress: Not on file  Social Connections: Not on file  Intimate Partner Violence: Not on file    Medications:   Current Outpatient Medications on File Prior to Visit  Medication Sig Dispense Refill   acetaminophen (TYLENOL) 325 MG tablet Take 2 tablets (650 mg total) by mouth every 4 (four) hours as needed for mild pain, fever or headache (or Fever >/= 101). 12 tablet 1   ALPRAZolam (XANAX) 0.5 MG tablet Take 0.25 mg by mouth daily as needed for anxiety.     alum & mag hydroxide-simeth (MYLANTA) 213-086-57 MG/5ML suspension Take 15 mLs by mouth every 8 (eight) hours. (Patient taking differently: Take 15 mLs by mouth every 6 (six) hours as needed.) 355 mL 0   amLODipine (NORVASC) 2.5 MG tablet Take 1 tablet (2.5 mg total) by mouth daily. 90 tablet 1   Artificial Tear Solution (SOOTHE XP OP) Place 1 drop into both eyes daily as needed (dry eyes).     aspirin 81 MG tablet Take 1 tablet (81 mg total) by mouth daily with breakfast. 30 tablet 5   diclofenac Sodium (VOLTAREN) 1 % GEL Apply 1 application topically 4 (four) times daily as needed (pain).     donepezil (ARICEPT) 10 MG tablet TAKE 1 TABLET BY MOUTH AT BEDTIME. 90 tablet 3   escitalopram (LEXAPRO) 20 MG tablet Take 1.5 tablets (30 mg total) by mouth daily. 45 tablet 3   ezetimibe (ZETIA) 10 MG tablet TAKE ONE TABLET BY MOUTH DAILY. 90 tablet 0   fluticasone (FLONASE) 50 MCG/ACT nasal spray Place 1 spray into both nostrils daily as needed for allergies or rhinitis.     fluvastatin (LESCOL) 40 MG capsule TAKE 1 CAPSULE BY MOUTH ONCE DAILY. 90 capsule 1   levocetirizine (XYZAL) 5 MG tablet Take 5 mg by mouth daily as needed for allergies.     Melatonin 10 MG TABS Take 10 mg by mouth daily after supper. 1 tablet 1   memantine (NAMENDA XR) 28 MG CP24 24 hr capsule Take 1 capsule by mouth once daily 90  capsule 3   metoprolol succinate (TOPROL-XL) 25 MG 24 hr tablet TAKE (1)  TABLET BY MOUTH AT BEDTIME. NEEDS OFFICE VISIT 90 tablet 1   naphazoline-glycerin (CLEAR EYES REDNESS) 0.012-0.2 % SOLN Place 2 drops into both eyes 4 (four) times daily as needed for eye irritation. 15 mL 1   nitroGLYCERIN (NITROSTAT) 0.4 MG SL tablet Dissolve 1 tablet under tongue every 5 mins up to 3 dose in 15 mins for chest pain. If no relief call 911. 25 tablet 0   omeprazole (PRILOSEC) 20 MG capsule Take 20 mg by mouth daily.     polyethylene glycol (MIRALAX / GLYCOLAX) 17 g packet Take 17 g by mouth daily. 30 each 2   Probiotic Product (PROBIOTIC DAILY PO) Take 1 capsule by mouth daily as needed (stomach issues).     ramipril (ALTACE) 5 MG capsule Take 1 capsule (5 mg total) by mouth daily. 90 capsule 0   traZODone (DESYREL) 50 MG tablet Take 1 tab for 7 days, then increase to 2 tab p.o. qhs for insomnia. 60 tablet 0   UNABLE TO FIND Diet: NAS     Vibegron (GEMTESA) 75 MG TABS Take 75 mg by mouth daily.     Current Facility-Administered Medications on File Prior to Visit  Medication Dose Route Frequency Provider Last Rate Last Admin   memantine (NAMENDA XR) 24 hr capsule 28 mg  28 mg Oral Daily Garvin Fila, MD        Allergies:   Allergies  Allergen Reactions   Macrodantin     Unknown   Penicillins     Unknown reaction   Pravachol [Pravastatin Sodium]     Unknown   Tetanus Toxoids     Unknown     Physical Exam General: Frail elderly Caucasian lady, seated, in no evident distress Head: head normocephalic and atraumatic.   Neck: supple with no carotid or supraclavicular bruits Cardiovascular: regular rate and rhythm, no murmurs Musculoskeletal: no deformity Skin:  no rash/petichiae Vascular:  Normal pulses all extremities  Neurologic Exam Mental Status: Awake and fully alert. Oriented to place and time. Recent and remote memory intact. Attention span, concentration and fund of knowledge  appropriate. Mood and affect appropriate.  Mini-Mental status exam 16/30 last visit 21/30).  Deficits in orientation, recall and copying.  Clock drawing 3/4.  Diminished recall 0/3.  Able to name only 5 animals that can walk on 4 legs.   Cranial Nerves: Fundoscopic exam not done. Pupils equal, briskly reactive to light. Extraocular movements full without nystagmus. Visual fields full to confrontation. Hearing mildly diminished bilaterally. Facial sensation intact. Face, tongue, palate moves normally and symmetrically.  Motor: Normal bulk and tone. Normal strength in all tested extremity muscles except diminished fine finger movements on the left and orbits right over left upper extremity.  Mild weakness of left grip.. Sensory.: intact to touch , pinprick , position and vibratory sensation.  Coordination: Rapid alternating movements normal in all extremities. Finger-to-nose and heel-to-shin performed accurately bilaterally. Gait and Station: Arises from chair without difficulty. Stance is slightly stooped gait demonstrates normal stride length and mild iimbalance .  Unable to do tandem walking. Reflexes: 1+ and symmetric. Toes downgoing.        03/26/2022    2:51 PM 09/26/2021    3:14 PM 06/05/2021    1:58 PM  MMSE - Mini Mental State Exam  Orientation to time '1 3 3  '$ Orientation to Place '5 5 5  '$ Registration '3 3 3  '$ Attention/ Calculation 0 1 1  Recall 0 0 0  Language- name 2 objects 2  2 2  Language- repeat '1 1 1  '$ Language- follow 3 step command '2 3 3  '$ Language- read & follow direction '1 1 1  '$ Write a sentence '1 1 1  '$ Copy design 0 1 0  Total score '16 21 20      '$ ASSESSMENT: 88 year old Caucasian lady with increasing recent falls likely multifactorial due to combination of mild mixed vascular and Alzheimer's dementia  and medication effect and prior stroke.  Recent cognitive  progression to early dementia with no significant improvement on Aricept and Namenda.  Remote history of right thalamic  hemorrhage in Apr 2012 of hypertensive etiology     PLAN: I had a long discussion with the patient and her daughter regarding her mixed dementia which may have progressively a little.  She should remain on Aricept 10 g daily as well as Namenda XR 28 mg daily which is tolerating well without side effects.  She may not qualify for the new Alzheimer`s medication Lequembi due to history of intracerebral hemorrhage.  She was encouraged to continue participation in cognitively challenging activities like solving crossword puzzles, playing bridge and sudoku.  We also discussed memory compensation strategies and fall prevention precautions.  I encouraged her to use a cane at all times.  Continue aspirin for stroke prevention and maintain aggressive risk factor modification with strict control of hypertension blood pressure goal below 140/90 and lipids with LDL cholesterol goal below 70 mg percent.  She will return for follow-up in the future in 6 months with my nurse practitioner or call earlier if necessary.Greater than 50% time during this 35-minute   visit was spent on counseling and coordination of care about her memory loss and cognitive impairment, falls, imbalance,   and answering questions Antony Contras, MD Note: This document was prepared with digital dictation and possible smart phrase technology. Any transcriptional errors that result from this process are unintentional.

## 2022-03-26 NOTE — Patient Instructions (Signed)
I had a long discussion with the patient and her daughter regarding her mixed dementia which may have progressively a little.  She should remain on Aricept 10 g daily as well as Namenda Exar 28 mg daily which is tolerating well without side effects.  She may not qualify for the new Alzheimer`s medication Lequembi due to history of intracerebral hemorrhage.  She was encouraged to continue participation in cognitively challenging activities like solving crossword puzzles, playing bridge and sudoku.  We also discussed memory compensation strategies and fall prevention precautions.  I encouraged her to use a cane at all times.  Continue aspirin for stroke prevention and maintain aggressive risk factor modification with strict control of hypertension blood pressure goal below 140/90 and lipids with LDL cholesterol goal below 70 mg percent.  She will return for follow-up in the future in 6 months with my nurse practitioner or call earlier if necessary. Memory Compensation Strategies  Use "WARM" strategy.  W= write it down  A= associate it  R= repeat it  M= make a mental note  2.   You can keep a Social worker.  Use a 3-ring notebook with sections for the following: calendar, important names and phone numbers,  medications, doctors' names/phone numbers, lists/reminders, and a section to journal what you did  each day.   3.    Use a calendar to write appointments down.  4.    Write yourself a schedule for the day.  This can be placed on the calendar or in a separate section of the Memory Notebook.  Keeping a  regular schedule can help memory.  5.    Use medication organizer with sections for each day or morning/evening pills.  You may need help loading it  6.    Keep a basket, or pegboard by the door.  Place items that you need to take out with you in the basket or on the pegboard.  You may also want to  include a message board for reminders.  7.    Use sticky notes.  Place sticky notes with  reminders in a place where the task is performed.  For example: " turn off the  stove" placed by the stove, "lock the door" placed on the door at eye level, " take your medications" on  the bathroom mirror or by the place where you normally take your medications.  8.    Use alarms/timers.  Use while cooking to remind yourself to check on food or as a reminder to take your medicine, or as a  reminder to make a call, or as a reminder to perform another task, etc.

## 2022-06-28 DIAGNOSIS — Z0000 Encounter for general adult medical examination without abnormal findings: Secondary | ICD-10-CM | POA: Diagnosis not present

## 2022-06-28 DIAGNOSIS — Z23 Encounter for immunization: Secondary | ICD-10-CM | POA: Diagnosis not present

## 2022-08-21 DIAGNOSIS — E782 Mixed hyperlipidemia: Secondary | ICD-10-CM | POA: Diagnosis not present

## 2022-08-21 DIAGNOSIS — R7303 Prediabetes: Secondary | ICD-10-CM | POA: Diagnosis not present

## 2022-08-22 ENCOUNTER — Telehealth: Payer: Self-pay | Admitting: Neurology

## 2022-08-22 DIAGNOSIS — R7303 Prediabetes: Secondary | ICD-10-CM | POA: Diagnosis not present

## 2022-08-22 MED ORDER — DONEPEZIL HCL 10 MG PO TABS: 10.0000 mg | ORAL_TABLET | Freq: Every day | ORAL | 3 refills | Status: AC

## 2022-08-22 NOTE — Telephone Encounter (Signed)
Pt is needing a refill on her donepezil (ARICEPT) 10 MG tablet sent in to the [facility] in [place]

## 2022-08-22 NOTE — Telephone Encounter (Signed)
Refill sent.

## 2022-09-25 NOTE — Progress Notes (Unsigned)
[facility] [address] [place]. [place] [address] [phone]       OFFICE FOLLOW UP NOTE  [name] Date of Birth:  09/06/1933 Medical Record Number:  [id number]    Primary neurologist: Dr. Leonie Proctor Reason for visit: Mixed dementia, hx of stroke    SUBJECTIVE:   CHIEF COMPLAINT:  No chief complaint on file.   HPI:   Update 09/26/2022 JM: Patient returns for 4-monthfollow-up accompanied by ***.  Reports cognition ***.   She is mostly independent with ADLs, does need assistance with IADLs. Does not drive.  Cognitively challenging activities *** No behavioral concerns Sleeps well, appetite good  Remains on Namenda XR 20 mg daily and Aricept 10 mg daily  Stable from stroke standpoint, no new stroke/TIA symptoms.     History provided for reference purposes only Update 03/26/2022 Dr. SLeonie Proctor She returns for follow-up after last visit 6 months ago.  She is accompanied by her daughter.  She continues to have memory and cognitive difficulties which she feels are unchanged.  Patient is still living at home with her husband.  She needs some help in the shower.  She has not been cooking or driving.  She is mostly independent and is able to attend to her needs.  She does do some word searches but does not otherwise do any cognitively challenging activities.  The daughter has not noticed any delusions, hallucinations, unsafe behavior or agitation.  Patient sleeps well.  She is able to ambulate independently.  She has had no falls or injuries.  On the Mini-Mental status exam today she scored 16/30 which was actually declined from 21/30 at last visit.  She is already on maximum dose Namenda XR 28 mg daily and Aricept 10 mg daily.  She remains on aspirin she is tolerating well without bruising or bleeding.  Her blood pressure is usually better at home though today it is elevated in office at 166/67.  She has had no recurrent stroke or TIA symptoms.   Update  09/26/2021 Dr. SLeonie Proctor She returns for follow-up after last visit 3 months ago.  She is accompanied by her son.  Patient feels she is noticing cognitive improvement after increasing the dose of Namenda to 28 mg daily which is tolerating well without any new side effects.  She also remains on Aricept 10 mg daily which she is tolerating well as well.  Patient has been participating and doing regular sodoku.  She continues to live at home with her husband.  Short-term memory remains poor.  She does not have any delusions, hallucinations, agitation or unsafe behavior.  She ambulates with a walker.  She has had no falls or injuries.  She remains on aspirin for stroke prevention and states her blood pressure is under good control though today it is elevated in office at 185/61.  She has had no recurrent stroke or TIA symptoms.   Update 06/05/2021 Dr. SLeonie Proctor She returns for follow-up after last visit 3 months ago.  She is accompanied by her daughter and son.  Patient is tolerating Namenda 10 mg twice daily well without any side effects.  Family feels her short-term memory and cognitive difficulties seem to be unchanged.  She has had no further worsening.  There has been no delusions, hallucinations or violent behavior noted though she does get agitated a lot particularly with her husband.  She is noted improvement in the vision after cataract surgery and is now been able to ambulate much better and she  is careful and uses a cane.  She has had no falls or injuries.  She does not like to go out a lot.  She has no new complaints.  Her blood pressure is well controlled.  She has no stroke or TIA symptoms.   Update 03/01/2021 Dr. Leonie Proctor: Patient returns for follow-up after last visit 6 months ago.  She is accompanied by her son and her daughter.  She has been tolerating Aricept well without any side effects.  He continues to haved ifficulties and poor short-term memory.  Patient has not had any improvement even after aricept  which she however seems to be tolerating well without any GI or CNS side effects.  She lives with her husband who also is having some cognitive difficulties.  Patient needs some help with the shower but otherwise is independent and uses a cane and ambulate safely.  She has had no falls.  She does get upset easily at times and is grumpy.  She has not had any violent behaviors, delusions or hallucinations.  She has been started on trazodone at night which helps her sleep well..  She had EEG done on 08/31/2020 which was normal.  She did a lot worse on Mini-Mental status exam today and scored 21/30 compared to last visit when she had scored 29/30   Initial visit 08/28/2020: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  She is seen today for consultation visit for increasing falls and memory impairment.  History is obtained from the patient and husband as well as daughter who accompany her today.  I have also personally reviewed electronic medical records and prior relevant imaging films in PACS.  Patient has had some balance difficulties ongoing since 2012 following her right thalamic hemorrhage but recently has been falling a lot.  She is unable to pinpoint exact reason for the falls and they occur at variable intervals and circumstances.  She is also noticed some worsening of her cognitive impairment and she gets confused and disoriented at times.  She does not remember to use a cane or a walker which she both has but does not not use consistently.  She states she just forgets to use it.  She was recently admitted to [facility] on 08/01/2020 and found to have some confusion and gait imbalance which was thought to be due to metabolic issues.  She was thought to have low-grade UTI but urine cultures were  negative.  MRI scan had shown moderate ventriculomegaly as well as cerebral atrophy raising question of NPH but Dr. Merlene Laughter who saw the patient did not feel patient had clinical history to suggest NPH.  Patient underwent extensive work-up including vitamin B12, TSH which were negative and made gradual improvement.  She has been referred to physical therapy but she has not yet started outpatient therapy but plans to do so soon.  On Mini-Mental status exam testing today she scored 29/30 with only minor deficits.  Patient has not been on Aricept or Namenda like medications but she was seen by me following the previous hemorrhage and mild cognitive impairment was diagnosed even as far back as in 2012.  There is no family Struve dementia.  She denies any recent headaches, seizures, loss of consciousness or new strokelike symptoms.  The patient has Xanax listed as a as needed medication but states that she has been taking  it every night to help her sleep.    ROS:   14 system review of systems performed and negative with exception of ***  PMH:  Past Medical History:  Diagnosis Date   Allergic rhinitis    Cataracts, bilateral    Coronary atherosclerosis of native coronary artery    DES LAD 3/12, 99% nondominant RCA, No CAD otherwise, LVEF 65%   Dementia ([place])    Diverticulitis    Essential hypertension    GERD (gastroesophageal reflux disease)    Hemorrhoids    Hyperlipidemia    Major depressive disorder with single episode    Metabolic encephalopathy    Stroke, hemorrhagic ([place])    Right thalamic hemorrhage 4/12   Syncope    Neurally mediated   Type 2 diabetes mellitus with unspecified diabetic retinopathy without macular edema (HCC)     PSH:  Past Surgical History:  Procedure Laterality Date   BREAST CYST EXCISION     CARDIAC CATHETERIZATION     CAROTID STENT  11/16/10   CATARACT EXTRACTION W/PHACO Left 04/11/2021   Procedure: CATARACT EXTRACTION PHACO AND INTRAOCULAR LENS PLACEMENT  ([place]);  Surgeon: Baruch Goldmann, MD;  Location: [facility];  Service: Ophthalmology;  Laterality: Left;  CDE   11.84   COLONOSCOPY N/A 10/02/2014   Dr. Rourk:engorged internal hemorrhoids/pancolonic diverticulosis   ESOPHAGOGASTRODUODENOSCOPY  10/02/14   Dr. Rourk:normal esophagus 2 cm hiatal hernia, gastric erosions likely from NSAID effect    Social History:  Social History   Socioeconomic History   Marital status: Married    Spouse name: Zenia Resides   Number of children: 2   Years of education: 12   Highest education level: Not on file  Occupational History   Occupation: Retired  Tobacco Use   Smoking status: Former    Packs/day: 0.25    Types: Cigarettes    Quit date: 09/02/1988    Years since quitting: 34.0   Smokeless tobacco: Never  Vaping Use   Vaping Use: Never used  Substance and Sexual Activity   Alcohol use: No    Alcohol/week: 0.0 standard drinks of alcohol   Drug use: No   Sexual activity: Not Currently  Other Topics Concern   Not on file  Social History Narrative   Lives with husband   Right handed   Drinks 1-2 cups caffeine daily   Social Determinants of Health   Financial Resource Strain: Not on file  Food Insecurity: Not on file  Transportation Needs: Not on file  Physical Activity: Not on file  Stress: Not on file  Social Connections: Not on file  Intimate Partner Violence: Not on file    Family History:  Family History  Problem Relation Age of Onset   Pancreatic cancer Mother        Died at age 65   Diabetes Mother    Cancer Mother    Coronary artery disease Father        Died in his 91s   Heart attack Father    Hypertension Father    Breast cancer Sister    Cancer Sister     Medications:   Current Outpatient Medications on File Prior to Visit  Medication Sig Dispense Refill   acetaminophen (TYLENOL) 325 MG tablet Take 2 tablets (650 mg total) by mouth every 4 (four) hours as needed for mild pain, fever or headache (or Fever >/= 101). 12  tablet 1   ALPRAZolam (XANAX) 0.5 MG tablet Take 0.25 mg by mouth daily as needed for anxiety.  alum & mag hydroxide-simeth (MYLANTA) 096-045-40 MG/5ML suspension Take 15 mLs by mouth every 8 (eight) hours. (Patient taking differently: Take 15 mLs by mouth every 6 (six) hours as needed.) 355 mL 0   amLODipine (NORVASC) 2.5 MG tablet Take 1 tablet (2.5 mg total) by mouth daily. 90 tablet 1   Artificial Tear Solution (SOOTHE XP OP) Place 1 drop into both eyes daily as needed (dry eyes).     aspirin 81 MG tablet Take 1 tablet (81 mg total) by mouth daily with breakfast. 30 tablet 5   diclofenac Sodium (VOLTAREN) 1 % GEL Apply 1 application topically 4 (four) times daily as needed (pain).     donepezil (ARICEPT) 10 MG tablet Take 1 tablet (10 mg total) by mouth at bedtime. 90 tablet 3   escitalopram (LEXAPRO) 20 MG tablet Take 1.5 tablets (30 mg total) by mouth daily. 45 tablet 3   ezetimibe (ZETIA) 10 MG tablet TAKE ONE TABLET BY MOUTH DAILY. 90 tablet 0   fluticasone (FLONASE) 50 MCG/ACT nasal spray Place 1 spray into both nostrils daily as needed for allergies or rhinitis.     fluvastatin (LESCOL) 40 MG capsule TAKE 1 CAPSULE BY MOUTH ONCE DAILY. 90 capsule 1   levocetirizine (XYZAL) 5 MG tablet Take 5 mg by mouth daily as needed for allergies.     Melatonin 10 MG TABS Take 10 mg by mouth daily after supper. 1 tablet 1   memantine (NAMENDA XR) 28 MG CP24 24 hr capsule Take 1 capsule by mouth once daily 90 capsule 3   metoprolol succinate (TOPROL-XL) 25 MG 24 hr tablet TAKE (1) TABLET BY MOUTH AT BEDTIME. NEEDS OFFICE VISIT 90 tablet 1   naphazoline-glycerin (CLEAR EYES REDNESS) 0.012-0.2 % SOLN Place 2 drops into both eyes 4 (four) times daily as needed for eye irritation. 15 mL 1   nitroGLYCERIN (NITROSTAT) 0.4 MG SL tablet Dissolve 1 tablet under tongue every 5 mins up to 3 dose in 15 mins for chest pain. If no relief call 911. 25 tablet 0   omeprazole (PRILOSEC) 20 MG capsule Take 20 mg by  mouth daily.     polyethylene glycol (MIRALAX / GLYCOLAX) 17 g packet Take 17 g by mouth daily. 30 each 2   Probiotic Product (PROBIOTIC DAILY PO) Take 1 capsule by mouth daily as needed (stomach issues).     ramipril (ALTACE) 5 MG capsule Take 1 capsule (5 mg total) by mouth daily. 90 capsule 0   traZODone (DESYREL) 50 MG tablet Take 1 tab for 7 days, then increase to 2 tab p.o. qhs for insomnia. 60 tablet 0   UNABLE TO FIND Diet: NAS     Vibegron (GEMTESA) 75 MG TABS Take 75 mg by mouth daily.     Current Facility-Administered Medications on File Prior to Visit  Medication Dose Route Frequency Provider Last Rate Last Admin   memantine (NAMENDA XR) 24 hr capsule 28 mg  28 mg Oral Daily Garvin Fila, MD        Allergies:   Allergies  Allergen Reactions   Macrodantin     Unknown   Penicillins     Unknown reaction   Pravachol [Pravastatin Sodium]     Unknown   Tetanus Toxoids     Unknown       OBJECTIVE:  Physical Exam  There were no vitals filed for this visit. There is no height or weight on file to calculate BMI. No results found.   General: well developed,  well nourished, seated, in no evident distress Head: head normocephalic and atraumatic.   Neck: supple with no carotid or supraclavicular bruits Cardiovascular: regular rate and rhythm, no murmurs Musculoskeletal: no deformity Skin:  no rash/petichiae Vascular:  Normal pulses all extremities   Neurologic Exam Mental Status: Awake and fully alert. Oriented to place and time. Recent and remote memory intact. Attention span, concentration and fund of knowledge appropriate. Mood and affect appropriate.  Cranial Nerves: Pupils equal, briskly reactive to light. Extraocular movements full without nystagmus. Visual fields full to confrontation. Hearing intact. Facial sensation intact. Face, tongue, palate moves normally and symmetrically.  Motor: Normal bulk and tone. Normal strength in all tested extremity  muscles Sensory.: intact to touch , pinprick , position and vibratory sensation.  Coordination: Rapid alternating movements normal in all extremities. Finger-to-nose and heel-to-shin performed accurately bilaterally. Gait and Station: Arises from chair without difficulty. Stance is normal. Gait demonstrates normal stride length and balance without use of AD. Tandem walk and heel toe without difficulty.  Reflexes: 1+ and symmetric. Toes downgoing.      03/26/2022    2:51 PM 09/26/2021    3:14 PM 06/05/2021    1:58 PM  MMSE - Mini Mental State Exam  Orientation to time '1 3 3  '$ Orientation to Place '5 5 5  '$ Registration '3 3 3  '$ Attention/ Calculation 0 1 1  Recall 0 0 0  Language- name 2 objects '2 2 2  '$ Language- repeat '1 1 1  '$ Language- follow 3 step command '2 3 3  '$ Language- read & follow direction '1 1 1  '$ Write a sentence '1 1 1  '$ Copy design 0 1 0  Total score '16 21 20         '$ ASSESSMENT/PLAN: LORENNA LURRY is a 89 y.o. 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  Mixed dementia  -MMSE today *** (prior 16/30)   -Continue Namenda XR 28 mg daily  -Continue Aricept 10 mg daily  -Discussed importance of routine participation in cognitively challenging activities as well as memory compensation strategies     Follow up in *** or call earlier if needed   CC:  PCP: Celene Squibb, MD    I spent *** minutes of face-to-face and non-face-to-face time with patient.  This included previsit chart review, lab review, study review, order entry, electronic health record documentation, patient education regarding ***   Frann Rider, AGNP-BC  [facility] [address] [place], [place] [address]  Phone [phone] Fax [phone] Note: This document was prepared with digital dictation and possible smart phrase technology. Any  transcriptional errors that result from this process are unintentional.

## 2022-09-26 ENCOUNTER — Ambulatory Visit: Payer: Medicare PPO | Admitting: Adult Health

## 2022-09-26 ENCOUNTER — Encounter: Payer: Self-pay | Admitting: Adult Health

## 2022-09-26 VITALS — BP 166/68 | HR 53 | Ht 62.0 in | Wt 141.0 lb

## 2022-09-26 DIAGNOSIS — F0280 Dementia in other diseases classified elsewhere without behavioral disturbance: Secondary | ICD-10-CM | POA: Diagnosis not present

## 2022-09-26 DIAGNOSIS — G309 Alzheimer's disease, unspecified: Secondary | ICD-10-CM | POA: Diagnosis not present

## 2022-09-26 DIAGNOSIS — F0150 Vascular dementia without behavioral disturbance: Secondary | ICD-10-CM | POA: Diagnosis not present

## 2022-09-26 MED ORDER — MEMANTINE HCL ER 28 MG PO CP24: 28.0000 mg | ORAL_CAPSULE | Freq: Every day | ORAL | 3 refills | Status: DC

## 2022-09-26 NOTE — Patient Instructions (Signed)
Your Plan:  Continue Namenda and Aricept  Please monitor for any behavioral concerns and call if these should occur  Increase participation in memory exercises and routine physical activity as this can further help your memory    Follow up in 6 months or call earlier if needed     Thank you for coming to see [place] at [facility]. I hope we have been able to provide you high quality care today.  You may receive a patient satisfaction survey over the next few weeks. We would appreciate your feedback and comments so that we may continue to improve ourselves and the health of our patients.

## 2022-10-31 ENCOUNTER — Encounter: Payer: Self-pay | Admitting: Radiology

## 2022-12-19 DIAGNOSIS — H61001 Unspecified perichondritis of right external ear: Secondary | ICD-10-CM | POA: Diagnosis not present

## 2023-02-18 DIAGNOSIS — E119 Type 2 diabetes mellitus without complications: Secondary | ICD-10-CM | POA: Diagnosis not present

## 2023-03-03 DIAGNOSIS — E782 Mixed hyperlipidemia: Secondary | ICD-10-CM | POA: Diagnosis not present

## 2023-03-03 DIAGNOSIS — R7303 Prediabetes: Secondary | ICD-10-CM | POA: Diagnosis not present

## 2023-03-04 ENCOUNTER — Encounter (INDEPENDENT_AMBULATORY_CARE_PROVIDER_SITE_OTHER): Payer: Medicare PPO | Admitting: Ophthalmology

## 2023-03-10 DIAGNOSIS — R269 Unspecified abnormalities of gait and mobility: Secondary | ICD-10-CM | POA: Diagnosis not present

## 2023-03-10 DIAGNOSIS — G4700 Insomnia, unspecified: Secondary | ICD-10-CM | POA: Diagnosis not present

## 2023-03-10 DIAGNOSIS — Z0001 Encounter for general adult medical examination with abnormal findings: Secondary | ICD-10-CM | POA: Diagnosis not present

## 2023-03-10 DIAGNOSIS — K5750 Diverticulosis of both small and large intestine without perforation or abscess without bleeding: Secondary | ICD-10-CM | POA: Diagnosis not present

## 2023-03-10 DIAGNOSIS — Z955 Presence of coronary angioplasty implant and graft: Secondary | ICD-10-CM | POA: Diagnosis not present

## 2023-03-10 DIAGNOSIS — F0390 Unspecified dementia without behavioral disturbance: Secondary | ICD-10-CM | POA: Diagnosis not present

## 2023-03-10 DIAGNOSIS — N1831 Chronic kidney disease, stage 3a: Secondary | ICD-10-CM | POA: Diagnosis not present

## 2023-03-10 DIAGNOSIS — I129 Hypertensive chronic kidney disease with stage 1 through stage 4 chronic kidney disease, or unspecified chronic kidney disease: Secondary | ICD-10-CM | POA: Diagnosis not present

## 2023-03-10 DIAGNOSIS — Z8673 Personal history of transient ischemic attack (TIA), and cerebral infarction without residual deficits: Secondary | ICD-10-CM | POA: Diagnosis not present

## 2023-03-10 DIAGNOSIS — E1165 Type 2 diabetes mellitus with hyperglycemia: Secondary | ICD-10-CM | POA: Diagnosis not present

## 2023-03-17 DIAGNOSIS — F411 Generalized anxiety disorder: Secondary | ICD-10-CM | POA: Diagnosis not present

## 2023-03-17 DIAGNOSIS — E782 Mixed hyperlipidemia: Secondary | ICD-10-CM | POA: Diagnosis not present

## 2023-03-17 DIAGNOSIS — K219 Gastro-esophageal reflux disease without esophagitis: Secondary | ICD-10-CM | POA: Diagnosis not present

## 2023-03-17 DIAGNOSIS — F32A Depression, unspecified: Secondary | ICD-10-CM | POA: Diagnosis not present

## 2023-03-17 DIAGNOSIS — F0390 Unspecified dementia without behavioral disturbance: Secondary | ICD-10-CM | POA: Diagnosis not present

## 2023-03-17 DIAGNOSIS — E1165 Type 2 diabetes mellitus with hyperglycemia: Secondary | ICD-10-CM | POA: Diagnosis not present

## 2023-03-17 DIAGNOSIS — N1831 Chronic kidney disease, stage 3a: Secondary | ICD-10-CM | POA: Diagnosis not present

## 2023-03-17 DIAGNOSIS — E1136 Type 2 diabetes mellitus with diabetic cataract: Secondary | ICD-10-CM | POA: Diagnosis not present

## 2023-03-17 DIAGNOSIS — E1122 Type 2 diabetes mellitus with diabetic chronic kidney disease: Secondary | ICD-10-CM | POA: Diagnosis not present

## 2023-03-27 DIAGNOSIS — F411 Generalized anxiety disorder: Secondary | ICD-10-CM | POA: Diagnosis not present

## 2023-03-27 DIAGNOSIS — N1831 Chronic kidney disease, stage 3a: Secondary | ICD-10-CM | POA: Diagnosis not present

## 2023-03-27 DIAGNOSIS — E1165 Type 2 diabetes mellitus with hyperglycemia: Secondary | ICD-10-CM | POA: Diagnosis not present

## 2023-03-27 DIAGNOSIS — F32A Depression, unspecified: Secondary | ICD-10-CM | POA: Diagnosis not present

## 2023-03-27 DIAGNOSIS — E782 Mixed hyperlipidemia: Secondary | ICD-10-CM | POA: Diagnosis not present

## 2023-03-27 DIAGNOSIS — E1136 Type 2 diabetes mellitus with diabetic cataract: Secondary | ICD-10-CM | POA: Diagnosis not present

## 2023-03-27 DIAGNOSIS — E1122 Type 2 diabetes mellitus with diabetic chronic kidney disease: Secondary | ICD-10-CM | POA: Diagnosis not present

## 2023-03-27 DIAGNOSIS — F0390 Unspecified dementia without behavioral disturbance: Secondary | ICD-10-CM | POA: Diagnosis not present

## 2023-03-27 DIAGNOSIS — K219 Gastro-esophageal reflux disease without esophagitis: Secondary | ICD-10-CM | POA: Diagnosis not present

## 2023-03-31 NOTE — Progress Notes (Deleted)
[facility] [address] [place]. [place] [address] [phone]       OFFICE FOLLOW UP NOTE  [name] Date of Birth:  09-Jun-1934 Medical Record Number:  [id number]    Primary neurologist: Dr. Pearlean Brownie Reason for visit: Mixed dementia, hx of stroke    SUBJECTIVE:   CHIEF COMPLAINT:  No chief complaint on file.   HPI:   Update 04/01/2023 JM: Returns for follow-up visit accompanied by daughter.     Continues on Namenda and Aricept      History provided for reference purposes only Update 09/26/2022 JM: Patient returns for 77-month follow-up accompanied by her daughter.  Reports cognition remains about the same since prior visit. Can have good days and bad days. Can have more fatigue towards the end of the day. Daughter now plans on being with her during the day and plans on trying to help her increase her daily activity as well as memory exercises. Does routienly do word searches but no other activities. Lives with her husband. She is mostly independent with ADLs, does need assistance with IADLs. Does not drive.  Denies any behavioral concerns. Sleeps well, appetite good.  MMSE today 20/30 (prior 16/30). Remains on Namenda XR 20 mg daily and Aricept 10 mg daily. Of note, blood pressure is elevated today which is typical when she comes to appointments, monitors at home and typically stable.  She was advised to follow-up with PCP if remains elevated.  Update 03/26/2022 Dr. Pearlean Brownie: She returns for follow-up after last visit 6 months ago.  She is accompanied by her daughter.  She continues to have memory and cognitive difficulties which she feels are unchanged.  Patient is still living at home with her husband.  She needs some help in the shower.  She has not been cooking or driving.  She is mostly independent and is able to attend to her needs.  She does do some word searches but does not otherwise do any cognitively challenging activities.  The daughter  has not noticed any delusions, hallucinations, unsafe behavior or agitation.  Patient sleeps well.  She is able to ambulate independently.  She has had no falls or injuries.  On the Mini-Mental status exam today she scored 16/30 which was actually declined from 21/30 at last visit.  She is already on maximum dose Namenda XR 28 mg daily and Aricept 10 mg daily.  She remains on aspirin she is tolerating well without bruising or bleeding.  Her blood pressure is usually better at home though today it is elevated in office at 166/67.  She has had no recurrent stroke or TIA symptoms.   Update 09/26/2021 Dr. Pearlean Brownie: She returns for follow-up after last visit 3 months ago.  She is accompanied by her son.  Patient feels she is noticing cognitive improvement after increasing the dose of Namenda to 28 mg daily which is tolerating well without any new side effects.  She also remains on Aricept 10 mg daily which she is tolerating well as well.  Patient has been participating and doing regular sodoku.  She continues to live at home with her husband.  Short-term memory remains poor.  She does not have any delusions, hallucinations, agitation or unsafe behavior.  She ambulates with a walker.  She has had no falls or injuries.  She remains on aspirin for stroke prevention and states her blood pressure is under good control though today it is elevated in office at 185/61.  She has had no recurrent  stroke or TIA symptoms.   Update 06/05/2021 Dr. Pearlean Brownie: She returns for follow-up after last visit 3 months ago.  She is accompanied by her daughter and son.  Patient is tolerating Namenda 10 mg twice daily well without any side effects.  Family feels her short-term memory and cognitive difficulties seem to be unchanged.  She has had no further worsening.  There has been no delusions, hallucinations or violent behavior noted though she does get agitated a lot particularly with her husband.  She is noted improvement in the vision after  cataract surgery and is now been able to ambulate much better and she is careful and uses a cane.  She has had no falls or injuries.  She does not like to go out a lot.  She has no new complaints.  Her blood pressure is well controlled.  She has no stroke or TIA symptoms.   Update 03/01/2021 Dr. Pearlean Brownie: Patient returns for follow-up after last visit 6 months ago.  She is accompanied by her son and her daughter.  She has been tolerating Aricept well without any side effects.  He continues to haved ifficulties and poor short-term memory.  Patient has not had any improvement even after aricept which she however seems to be tolerating well without any GI or CNS side effects.  She lives with her husband who also is having some cognitive difficulties.  Patient needs some help with the shower but otherwise is independent and uses a cane and ambulate safely.  She has had no falls.  She does get upset easily at times and is grumpy.  She has not had any violent behaviors, delusions or hallucinations.  She has been started on trazodone at night which helps her sleep well..  She had EEG done on 08/31/2020 which was normal.  She did a lot worse on Mini-Mental status exam today and scored 21/30 compared to last visit when she had scored 29/30   Initial visit 08/28/2020:[name] is a 89 year old pleasant Caucasian lady with past medical history of right thalamic hemorrhage in Apr 2012, syncope, gastroesophageal reflux disease, hypertension, diverticulitis, diabetes, gait difficulties and mild cognitive impairment.  She is seen today for consultation visit for increasing falls and memory impairment.  History is obtained from the patient and husband as well as daughter who accompany her today.  I have also personally reviewed electronic medical records and prior relevant imaging films in PACS.  Patient has had some balance difficulties ongoing since 2012 following her right thalamic hemorrhage but recently has been falling a  lot.  She is unable to pinpoint exact reason for the falls and they occur at variable intervals and circumstances.  She is also noticed some worsening of her cognitive impairment and she gets confused and disoriented at times.  She does not remember to use a cane or a walker which she both has but does not not use consistently.  She states she just forgets to use it.  She was recently admitted to [facility] on 08/01/2020 and found to have some confusion and gait imbalance which was thought to be due to metabolic issues.  She was thought to have low-grade UTI but urine cultures were negative.  MRI scan had shown moderate ventriculomegaly as well as cerebral atrophy raising question of NPH but Dr. Gerilyn Pilgrim who saw the patient did not feel patient had clinical history to suggest NPH.  Patient underwent extensive work-up including vitamin B12, TSH which were negative and made gradual improvement.  She  has been referred to physical therapy but she has not yet started outpatient therapy but plans to do so soon.  On Mini-Mental status exam testing today she scored 29/30 with only minor deficits.  Patient has not been on Aricept or Namenda like medications but she was seen by me following the previous hemorrhage and mild cognitive impairment was diagnosed even as far back as in 2012.  There is no family Struve dementia.  She denies any recent headaches, seizures, loss of consciousness or new strokelike symptoms.  The patient has Xanax listed as a as needed medication but states that she has been taking it every night to help her sleep.    ROS:   14 system review of systems performed and negative with exception of those listed in HPI  PMH:  Past Medical History:  Diagnosis Date   Allergic rhinitis    Cataracts, bilateral    Coronary atherosclerosis of native coronary artery    DES LAD 3/12, 99% nondominant RCA, No CAD otherwise, LVEF 65%   Dementia (HCC)    Diverticulitis    Essential hypertension     GERD (gastroesophageal reflux disease)    Hemorrhoids    Hyperlipidemia    Major depressive disorder with single episode    Metabolic encephalopathy    Stroke, hemorrhagic (HCC)    Right thalamic hemorrhage 4/12   Syncope    Neurally mediated   Type 2 diabetes mellitus with unspecified diabetic retinopathy without macular edema (HCC)     PSH:  Past Surgical History:  Procedure Laterality Date   BREAST CYST EXCISION     CARDIAC CATHETERIZATION     CAROTID STENT  11/16/10   CATARACT EXTRACTION W/PHACO Left 04/11/2021   Procedure: CATARACT EXTRACTION PHACO AND INTRAOCULAR LENS PLACEMENT (IOC);  Surgeon: Fabio Pierce, MD;  Location: [facility];  Service: Ophthalmology;  Laterality: Left;  CDE   11.84   COLONOSCOPY N/A 10/02/2014   Dr. Rourk:engorged internal hemorrhoids/pancolonic diverticulosis   ESOPHAGOGASTRODUODENOSCOPY  10/02/14   Dr. Rourk:normal esophagus 2 cm hiatal hernia, gastric erosions likely from NSAID effect    Social History:  Social History   Socioeconomic History   Marital status: Married    Spouse name: Freida Busman   Number of children: 2   Years of education: 12   Highest education level: Not on file  Occupational History   Occupation: Retired  Tobacco Use   Smoking status: Former    Current packs/day: 0.00    Types: Cigarettes    Quit date: 09/02/1988    Years since quitting: 34.5   Smokeless tobacco: Never  Vaping Use   Vaping status: Never Used  Substance and Sexual Activity   Alcohol use: No    Alcohol/week: 0.0 standard drinks of alcohol   Drug use: No   Sexual activity: Not Currently  Other Topics Concern   Not on file  Social History Narrative   Lives with husband   Right handed   Drinks 1-2 cups caffeine daily   Social Determinants of Health   Financial Resource Strain: Not on file  Food Insecurity: Not on file  Transportation Needs: Not on file  Physical Activity: Not on file  Stress: Not on file  Social Connections: Not on file  Intimate  Partner Violence: Not on file    Family History:  Family History  Problem Relation Age of Onset   Pancreatic cancer Mother        Died at age 9   Diabetes Mother    Cancer  Mother    Coronary artery disease Father        Died in his 101s   Heart attack Father    Hypertension Father    Breast cancer Sister    Cancer Sister     Medications:   Current Outpatient Medications on File Prior to Visit  Medication Sig Dispense Refill   acetaminophen (TYLENOL) 325 MG tablet Take 2 tablets (650 mg total) by mouth every 4 (four) hours as needed for mild pain, fever or headache (or Fever >/= 101). 12 tablet 1   ALPRAZolam (XANAX) 0.5 MG tablet Take 0.25 mg by mouth daily as needed for anxiety.     alum & mag hydroxide-simeth (MYLANTA) 200-200-20 MG/5ML suspension Take 15 mLs by mouth every 8 (eight) hours. (Patient taking differently: Take 15 mLs by mouth every 6 (six) hours as needed.) 355 mL 0   amLODipine (NORVASC) 2.5 MG tablet Take 1 tablet (2.5 mg total) by mouth daily. 90 tablet 1   Artificial Tear Solution (SOOTHE XP OP) Place 1 drop into both eyes daily as needed (dry eyes).     aspirin 81 MG tablet Take 1 tablet (81 mg total) by mouth daily with breakfast. 30 tablet 5   diclofenac Sodium (VOLTAREN) 1 % GEL Apply 1 application topically 4 (four) times daily as needed (pain). (Patient not taking: Reported on 09/26/2022)     donepezil (ARICEPT) 10 MG tablet Take 1 tablet (10 mg total) by mouth at bedtime. 90 tablet 3   escitalopram (LEXAPRO) 20 MG tablet Take 1.5 tablets (30 mg total) by mouth daily. 45 tablet 3   ezetimibe (ZETIA) 10 MG tablet TAKE ONE TABLET BY MOUTH DAILY. 90 tablet 0   fluticasone (FLONASE) 50 MCG/ACT nasal spray Place 1 spray into both nostrils daily as needed for allergies or rhinitis.     fluvastatin (LESCOL) 40 MG capsule TAKE 1 CAPSULE BY MOUTH ONCE DAILY. 90 capsule 1   levocetirizine (XYZAL) 5 MG tablet Take 5 mg by mouth daily as needed for allergies. (Patient  not taking: Reported on 09/26/2022)     Melatonin 10 MG TABS Take 10 mg by mouth daily after supper. 1 tablet 1   memantine (NAMENDA XR) 28 MG CP24 24 hr capsule Take 1 capsule (28 mg total) by mouth daily. 90 capsule 3   metoprolol succinate (TOPROL-XL) 25 MG 24 hr tablet TAKE (1) TABLET BY MOUTH AT BEDTIME. NEEDS OFFICE VISIT 90 tablet 1   naphazoline-glycerin (CLEAR EYES REDNESS) 0.012-0.2 % SOLN Place 2 drops into both eyes 4 (four) times daily as needed for eye irritation. 15 mL 1   nitroGLYCERIN (NITROSTAT) 0.4 MG SL tablet Dissolve 1 tablet under tongue every 5 mins up to 3 dose in 15 mins for chest pain. If no relief call 911. 25 tablet 0   omeprazole (PRILOSEC) 20 MG capsule Take 20 mg by mouth daily.     polyethylene glycol (MIRALAX / GLYCOLAX) 17 g packet Take 17 g by mouth daily. 30 each 2   Probiotic Product (PROBIOTIC DAILY PO) Take 1 capsule by mouth daily as needed (stomach issues).     ramipril (ALTACE) 5 MG capsule Take 1 capsule (5 mg total) by mouth daily. 90 capsule 0   traZODone (DESYREL) 50 MG tablet Take 1 tab for 7 days, then increase to 2 tab p.o. qhs for insomnia. 60 tablet 0   UNABLE TO FIND Diet: NAS     Vibegron (GEMTESA) 75 MG TABS Take 75 mg by mouth daily.  No current facility-administered medications on file prior to visit.    Allergies:   Allergies  Allergen Reactions   Macrodantin     Unknown   Penicillins     Unknown reaction   Pravachol [Pravastatin Sodium]     Unknown   Tetanus Toxoids     Unknown       OBJECTIVE:  Physical Exam  There were no vitals filed for this visit.  There is no height or weight on file to calculate BMI. No results found.   General: well developed, well nourished, very pleasant elderly Caucasian female, seated, in no evident distress Head: head normocephalic and atraumatic.   Neck: supple with no carotid or supraclavicular bruits Cardiovascular: regular rate and rhythm, no murmurs Musculoskeletal: no  deformity Skin:  no rash/petichiae Vascular:  Normal pulses all extremities   Neurologic Exam Mental Status: Awake and fully alert. Oriented to place and time. Recent memory impaired and remote memory intact. Attention span, concentration and fund of knowledge appropriate during visit with daughter supplementing some history. Mood and affect appropriate.  Cranial Nerves: Pupils equal, briskly reactive to light. Extraocular movements full without nystagmus. Visual fields full to confrontation. Hearing intact. Facial sensation intact. Face, tongue, palate moves normally and symmetrically.  Motor: Normal bulk and tone. Normal strength in all tested extremity muscles Sensory.: intact to touch , pinprick , position and vibratory sensation.  Coordination: Rapid alternating movements normal in all extremities. Finger-to-nose and heel-to-shin performed accurately bilaterally. Gait and Station: Arises from chair without difficulty. Stance is normal. Gait demonstrates normal stride length and balance with use of cane. Tandem walk and heel toe not attempted Reflexes: 1+ and symmetric. Toes downgoing.      09/26/2022    1:39 PM 03/26/2022    2:51 PM 09/26/2021    3:14 PM  MMSE - Mini Mental State Exam  Orientation to time 2 1 3   Orientation to Place 5 5 5   Registration 3 3 3   Attention/ Calculation 0 0 1  Recall 2 0 0  Language- name 2 objects 2 2 2   Language- repeat 1 1 1   Language- follow 3 step command 2 2 3   Language- read & follow direction 1 1 1   Write a sentence 1 1 1   Copy design 1 0 1  Total score 20 16 21          ASSESSMENT/PLAN: [name] is a 89 y.o. year old female with history of right thalamic stroke in 2012 and cognitive impairment with progression to early dementia likely mixed vascular and Alzheimer's   1.  Mixed dementia  -MMSE today 20/30 (prior 16/30)   -Continue Namenda XR 28 mg daily  -Continue Aricept 10 mg daily  -Discussed importance of routine  participation in cognitively challenging activities as well as memory compensation strategies and routine physical activity     Follow up in 6 months or call earlier if needed   CC:  PCP: Benita Stabile, MD    I spent 26 minutes of face-to-face and non-face-to-face time with patient and daugther.  This included previsit chart review, lab review, study review, order entry, electronic health record documentation, patient education regarding above diagnoses and treatment plan and answered all the questions to patient and daughter satisfaction   Ihor Austin, [facility]  [facility] [address] [place], [place] [address]  Phone [phone] Fax [phone] Note: This document was prepared with digital dictation and possible smart phrase technology. Any transcriptional errors that result from  this process are unintentional.

## 2023-04-01 ENCOUNTER — Ambulatory Visit: Payer: Medicare PPO | Admitting: Adult Health

## 2023-04-02 DIAGNOSIS — E1122 Type 2 diabetes mellitus with diabetic chronic kidney disease: Secondary | ICD-10-CM | POA: Diagnosis not present

## 2023-04-02 DIAGNOSIS — F0390 Unspecified dementia without behavioral disturbance: Secondary | ICD-10-CM | POA: Diagnosis not present

## 2023-04-02 DIAGNOSIS — N1831 Chronic kidney disease, stage 3a: Secondary | ICD-10-CM | POA: Diagnosis not present

## 2023-04-02 DIAGNOSIS — E1136 Type 2 diabetes mellitus with diabetic cataract: Secondary | ICD-10-CM | POA: Diagnosis not present

## 2023-04-02 DIAGNOSIS — E1165 Type 2 diabetes mellitus with hyperglycemia: Secondary | ICD-10-CM | POA: Diagnosis not present

## 2023-04-02 DIAGNOSIS — E782 Mixed hyperlipidemia: Secondary | ICD-10-CM | POA: Diagnosis not present

## 2023-04-02 DIAGNOSIS — F411 Generalized anxiety disorder: Secondary | ICD-10-CM | POA: Diagnosis not present

## 2023-04-02 DIAGNOSIS — F32A Depression, unspecified: Secondary | ICD-10-CM | POA: Diagnosis not present

## 2023-04-02 DIAGNOSIS — K219 Gastro-esophageal reflux disease without esophagitis: Secondary | ICD-10-CM | POA: Diagnosis not present

## 2023-04-28 ENCOUNTER — Ambulatory Visit: Payer: Medicare PPO | Admitting: Orthopedic Surgery

## 2023-06-12 DIAGNOSIS — Z23 Encounter for immunization: Secondary | ICD-10-CM | POA: Diagnosis not present

## 2023-07-09 DIAGNOSIS — E1165 Type 2 diabetes mellitus with hyperglycemia: Secondary | ICD-10-CM | POA: Diagnosis not present

## 2023-07-09 DIAGNOSIS — E782 Mixed hyperlipidemia: Secondary | ICD-10-CM | POA: Diagnosis not present

## 2023-07-29 DIAGNOSIS — F0390 Unspecified dementia without behavioral disturbance: Secondary | ICD-10-CM | POA: Diagnosis not present

## 2023-07-29 DIAGNOSIS — R269 Unspecified abnormalities of gait and mobility: Secondary | ICD-10-CM | POA: Diagnosis not present

## 2023-07-29 DIAGNOSIS — N1831 Chronic kidney disease, stage 3a: Secondary | ICD-10-CM | POA: Diagnosis not present

## 2023-07-29 DIAGNOSIS — Z955 Presence of coronary angioplasty implant and graft: Secondary | ICD-10-CM | POA: Diagnosis not present

## 2023-07-29 DIAGNOSIS — I129 Hypertensive chronic kidney disease with stage 1 through stage 4 chronic kidney disease, or unspecified chronic kidney disease: Secondary | ICD-10-CM | POA: Diagnosis not present

## 2023-07-29 DIAGNOSIS — K5750 Diverticulosis of both small and large intestine without perforation or abscess without bleeding: Secondary | ICD-10-CM | POA: Diagnosis not present

## 2023-07-29 DIAGNOSIS — E1122 Type 2 diabetes mellitus with diabetic chronic kidney disease: Secondary | ICD-10-CM | POA: Diagnosis not present

## 2023-07-29 DIAGNOSIS — I10 Essential (primary) hypertension: Secondary | ICD-10-CM | POA: Diagnosis not present

## 2023-07-29 DIAGNOSIS — E1165 Type 2 diabetes mellitus with hyperglycemia: Secondary | ICD-10-CM | POA: Diagnosis not present

## 2023-08-25 DIAGNOSIS — F0390 Unspecified dementia without behavioral disturbance: Secondary | ICD-10-CM | POA: Diagnosis not present

## 2023-08-25 DIAGNOSIS — G4700 Insomnia, unspecified: Secondary | ICD-10-CM | POA: Diagnosis not present

## 2023-08-25 DIAGNOSIS — J309 Allergic rhinitis, unspecified: Secondary | ICD-10-CM | POA: Diagnosis not present

## 2023-08-25 DIAGNOSIS — I119 Hypertensive heart disease without heart failure: Secondary | ICD-10-CM | POA: Diagnosis not present

## 2023-08-25 DIAGNOSIS — I209 Angina pectoris, unspecified: Secondary | ICD-10-CM | POA: Diagnosis not present

## 2023-08-25 DIAGNOSIS — E782 Mixed hyperlipidemia: Secondary | ICD-10-CM | POA: Diagnosis not present

## 2023-08-25 DIAGNOSIS — N3281 Overactive bladder: Secondary | ICD-10-CM | POA: Diagnosis not present

## 2023-08-25 DIAGNOSIS — K219 Gastro-esophageal reflux disease without esophagitis: Secondary | ICD-10-CM | POA: Diagnosis not present

## 2023-08-28 DIAGNOSIS — E039 Hypothyroidism, unspecified: Secondary | ICD-10-CM | POA: Diagnosis not present

## 2023-08-28 DIAGNOSIS — E782 Mixed hyperlipidemia: Secondary | ICD-10-CM | POA: Diagnosis not present

## 2023-08-28 DIAGNOSIS — I10 Essential (primary) hypertension: Secondary | ICD-10-CM | POA: Diagnosis not present

## 2023-08-28 DIAGNOSIS — E119 Type 2 diabetes mellitus without complications: Secondary | ICD-10-CM | POA: Diagnosis not present

## 2023-08-28 DIAGNOSIS — E559 Vitamin D deficiency, unspecified: Secondary | ICD-10-CM | POA: Diagnosis not present

## 2023-08-29 DIAGNOSIS — E039 Hypothyroidism, unspecified: Secondary | ICD-10-CM | POA: Diagnosis not present

## 2023-08-29 DIAGNOSIS — E559 Vitamin D deficiency, unspecified: Secondary | ICD-10-CM | POA: Diagnosis not present

## 2023-08-29 DIAGNOSIS — E119 Type 2 diabetes mellitus without complications: Secondary | ICD-10-CM | POA: Diagnosis not present

## 2023-08-29 DIAGNOSIS — I10 Essential (primary) hypertension: Secondary | ICD-10-CM | POA: Diagnosis not present

## 2023-08-29 DIAGNOSIS — E782 Mixed hyperlipidemia: Secondary | ICD-10-CM | POA: Diagnosis not present

## 2023-09-01 DIAGNOSIS — J069 Acute upper respiratory infection, unspecified: Secondary | ICD-10-CM | POA: Diagnosis not present

## 2023-09-01 DIAGNOSIS — E559 Vitamin D deficiency, unspecified: Secondary | ICD-10-CM | POA: Diagnosis not present

## 2023-09-01 DIAGNOSIS — R5383 Other fatigue: Secondary | ICD-10-CM | POA: Diagnosis not present

## 2023-09-02 DIAGNOSIS — R0989 Other specified symptoms and signs involving the circulatory and respiratory systems: Secondary | ICD-10-CM | POA: Diagnosis not present

## 2023-09-02 DIAGNOSIS — R0602 Shortness of breath: Secondary | ICD-10-CM | POA: Diagnosis not present

## 2023-09-02 DIAGNOSIS — F330 Major depressive disorder, recurrent, mild: Secondary | ICD-10-CM | POA: Diagnosis not present

## 2023-09-08 DIAGNOSIS — J069 Acute upper respiratory infection, unspecified: Secondary | ICD-10-CM | POA: Diagnosis not present

## 2023-09-08 DIAGNOSIS — E1151 Type 2 diabetes mellitus with diabetic peripheral angiopathy without gangrene: Secondary | ICD-10-CM | POA: Diagnosis not present

## 2023-09-19 DIAGNOSIS — E559 Vitamin D deficiency, unspecified: Secondary | ICD-10-CM | POA: Diagnosis not present

## 2023-09-19 DIAGNOSIS — N183 Chronic kidney disease, stage 3 unspecified: Secondary | ICD-10-CM | POA: Diagnosis not present

## 2023-09-19 DIAGNOSIS — K219 Gastro-esophageal reflux disease without esophagitis: Secondary | ICD-10-CM | POA: Diagnosis not present

## 2023-09-19 DIAGNOSIS — R5383 Other fatigue: Secondary | ICD-10-CM | POA: Diagnosis not present

## 2023-09-23 DIAGNOSIS — I119 Hypertensive heart disease without heart failure: Secondary | ICD-10-CM | POA: Diagnosis not present

## 2023-09-23 DIAGNOSIS — N3281 Overactive bladder: Secondary | ICD-10-CM | POA: Diagnosis not present

## 2023-09-23 DIAGNOSIS — K219 Gastro-esophageal reflux disease without esophagitis: Secondary | ICD-10-CM | POA: Diagnosis not present

## 2023-09-30 DIAGNOSIS — F330 Major depressive disorder, recurrent, mild: Secondary | ICD-10-CM | POA: Diagnosis not present

## 2023-10-01 DIAGNOSIS — F419 Anxiety disorder, unspecified: Secondary | ICD-10-CM | POA: Diagnosis not present

## 2023-10-01 DIAGNOSIS — F330 Major depressive disorder, recurrent, mild: Secondary | ICD-10-CM | POA: Diagnosis not present

## 2023-10-01 DIAGNOSIS — F0390 Unspecified dementia without behavioral disturbance: Secondary | ICD-10-CM | POA: Diagnosis not present

## 2023-10-01 DIAGNOSIS — G4700 Insomnia, unspecified: Secondary | ICD-10-CM | POA: Diagnosis not present

## 2023-10-02 DIAGNOSIS — F419 Anxiety disorder, unspecified: Secondary | ICD-10-CM | POA: Diagnosis not present

## 2023-10-02 DIAGNOSIS — I209 Angina pectoris, unspecified: Secondary | ICD-10-CM | POA: Diagnosis not present

## 2023-10-14 DIAGNOSIS — F419 Anxiety disorder, unspecified: Secondary | ICD-10-CM | POA: Diagnosis not present

## 2023-10-14 DIAGNOSIS — F330 Major depressive disorder, recurrent, mild: Secondary | ICD-10-CM | POA: Diagnosis not present

## 2023-10-21 DIAGNOSIS — F330 Major depressive disorder, recurrent, mild: Secondary | ICD-10-CM | POA: Diagnosis not present

## 2023-10-21 DIAGNOSIS — E782 Mixed hyperlipidemia: Secondary | ICD-10-CM | POA: Diagnosis not present

## 2023-10-21 DIAGNOSIS — F0390 Unspecified dementia without behavioral disturbance: Secondary | ICD-10-CM | POA: Diagnosis not present

## 2023-10-21 DIAGNOSIS — F419 Anxiety disorder, unspecified: Secondary | ICD-10-CM | POA: Diagnosis not present

## 2023-10-21 DIAGNOSIS — I209 Angina pectoris, unspecified: Secondary | ICD-10-CM | POA: Diagnosis not present

## 2023-10-21 DIAGNOSIS — J309 Allergic rhinitis, unspecified: Secondary | ICD-10-CM | POA: Diagnosis not present

## 2023-10-21 DIAGNOSIS — G4700 Insomnia, unspecified: Secondary | ICD-10-CM | POA: Diagnosis not present

## 2023-10-28 DIAGNOSIS — F330 Major depressive disorder, recurrent, mild: Secondary | ICD-10-CM | POA: Diagnosis not present

## 2023-11-11 DIAGNOSIS — F330 Major depressive disorder, recurrent, mild: Secondary | ICD-10-CM | POA: Diagnosis not present

## 2023-11-20 DIAGNOSIS — E782 Mixed hyperlipidemia: Secondary | ICD-10-CM | POA: Diagnosis not present

## 2023-11-20 DIAGNOSIS — E1165 Type 2 diabetes mellitus with hyperglycemia: Secondary | ICD-10-CM | POA: Diagnosis not present

## 2023-11-24 ENCOUNTER — Telehealth: Payer: Self-pay | Admitting: Adult Health

## 2023-11-24 DIAGNOSIS — N3281 Overactive bladder: Secondary | ICD-10-CM | POA: Diagnosis not present

## 2023-11-24 DIAGNOSIS — K219 Gastro-esophageal reflux disease without esophagitis: Secondary | ICD-10-CM | POA: Diagnosis not present

## 2023-11-24 DIAGNOSIS — I10 Essential (primary) hypertension: Secondary | ICD-10-CM | POA: Diagnosis not present

## 2023-11-24 NOTE — Telephone Encounter (Signed)
 Pt's daughter,Donna Hodgin call to schedule patient's follow up appointment

## 2023-11-25 DIAGNOSIS — F330 Major depressive disorder, recurrent, mild: Secondary | ICD-10-CM | POA: Diagnosis not present

## 2023-11-26 DIAGNOSIS — E782 Mixed hyperlipidemia: Secondary | ICD-10-CM | POA: Diagnosis not present

## 2023-11-26 DIAGNOSIS — F419 Anxiety disorder, unspecified: Secondary | ICD-10-CM | POA: Diagnosis not present

## 2023-11-27 DIAGNOSIS — N1831 Chronic kidney disease, stage 3a: Secondary | ICD-10-CM | POA: Diagnosis not present

## 2023-11-27 DIAGNOSIS — E1122 Type 2 diabetes mellitus with diabetic chronic kidney disease: Secondary | ICD-10-CM | POA: Diagnosis not present

## 2023-11-27 DIAGNOSIS — F0390 Unspecified dementia without behavioral disturbance: Secondary | ICD-10-CM | POA: Diagnosis not present

## 2023-11-27 DIAGNOSIS — Z8673 Personal history of transient ischemic attack (TIA), and cerebral infarction without residual deficits: Secondary | ICD-10-CM | POA: Diagnosis not present

## 2023-11-27 DIAGNOSIS — E1165 Type 2 diabetes mellitus with hyperglycemia: Secondary | ICD-10-CM | POA: Diagnosis not present

## 2023-11-27 DIAGNOSIS — Z955 Presence of coronary angioplasty implant and graft: Secondary | ICD-10-CM | POA: Diagnosis not present

## 2023-11-27 DIAGNOSIS — K5750 Diverticulosis of both small and large intestine without perforation or abscess without bleeding: Secondary | ICD-10-CM | POA: Diagnosis not present

## 2023-11-27 DIAGNOSIS — R269 Unspecified abnormalities of gait and mobility: Secondary | ICD-10-CM | POA: Diagnosis not present

## 2023-11-27 DIAGNOSIS — I129 Hypertensive chronic kidney disease with stage 1 through stage 4 chronic kidney disease, or unspecified chronic kidney disease: Secondary | ICD-10-CM | POA: Diagnosis not present

## 2023-12-02 DIAGNOSIS — F330 Major depressive disorder, recurrent, mild: Secondary | ICD-10-CM | POA: Diagnosis not present

## 2023-12-02 DIAGNOSIS — F419 Anxiety disorder, unspecified: Secondary | ICD-10-CM | POA: Diagnosis not present

## 2023-12-02 DIAGNOSIS — F0390 Unspecified dementia without behavioral disturbance: Secondary | ICD-10-CM | POA: Diagnosis not present

## 2023-12-15 DIAGNOSIS — E782 Mixed hyperlipidemia: Secondary | ICD-10-CM | POA: Diagnosis not present

## 2023-12-15 DIAGNOSIS — J309 Allergic rhinitis, unspecified: Secondary | ICD-10-CM | POA: Diagnosis not present

## 2023-12-15 DIAGNOSIS — K219 Gastro-esophageal reflux disease without esophagitis: Secondary | ICD-10-CM | POA: Diagnosis not present

## 2023-12-16 DIAGNOSIS — G4700 Insomnia, unspecified: Secondary | ICD-10-CM | POA: Diagnosis not present

## 2023-12-16 DIAGNOSIS — F419 Anxiety disorder, unspecified: Secondary | ICD-10-CM | POA: Diagnosis not present

## 2023-12-16 DIAGNOSIS — F330 Major depressive disorder, recurrent, mild: Secondary | ICD-10-CM | POA: Diagnosis not present

## 2023-12-16 DIAGNOSIS — F0390 Unspecified dementia without behavioral disturbance: Secondary | ICD-10-CM | POA: Diagnosis not present

## 2023-12-18 DIAGNOSIS — F419 Anxiety disorder, unspecified: Secondary | ICD-10-CM | POA: Diagnosis not present

## 2023-12-18 DIAGNOSIS — E782 Mixed hyperlipidemia: Secondary | ICD-10-CM | POA: Diagnosis not present

## 2023-12-19 DIAGNOSIS — E782 Mixed hyperlipidemia: Secondary | ICD-10-CM | POA: Diagnosis not present

## 2023-12-19 DIAGNOSIS — K219 Gastro-esophageal reflux disease without esophagitis: Secondary | ICD-10-CM | POA: Diagnosis not present

## 2023-12-19 DIAGNOSIS — J309 Allergic rhinitis, unspecified: Secondary | ICD-10-CM | POA: Diagnosis not present

## 2023-12-23 DIAGNOSIS — F330 Major depressive disorder, recurrent, mild: Secondary | ICD-10-CM | POA: Diagnosis not present

## 2024-01-08 ENCOUNTER — Encounter: Payer: Self-pay | Admitting: Cardiology

## 2024-01-08 ENCOUNTER — Ambulatory Visit: Attending: Cardiology | Admitting: Cardiology

## 2024-01-08 VITALS — BP 138/70 | HR 56 | Ht 62.0 in | Wt 144.0 lb

## 2024-01-08 DIAGNOSIS — I2510 Atherosclerotic heart disease of native coronary artery without angina pectoris: Secondary | ICD-10-CM

## 2024-01-08 DIAGNOSIS — E782 Mixed hyperlipidemia: Secondary | ICD-10-CM

## 2024-01-08 DIAGNOSIS — Z8673 Personal history of transient ischemic attack (TIA), and cerebral infarction without residual deficits: Secondary | ICD-10-CM

## 2024-01-08 DIAGNOSIS — I25119 Atherosclerotic heart disease of native coronary artery with unspecified angina pectoris: Secondary | ICD-10-CM

## 2024-01-08 DIAGNOSIS — I10 Essential (primary) hypertension: Secondary | ICD-10-CM

## 2024-01-08 NOTE — Progress Notes (Signed)
    Cardiology Office Note  Date: 01/08/2024   ID: JUHEE COOLEY, DOB 11-19-1933, MRN [id number]  History of Present Illness: [name] is an 89 y.o. female last seen in the office in October 2022.  She is here today with her daughter for a follow-up visit.  She has been living in [place] since last year, does have dementia but is fairly functional.  She does not report any obvious angina or nitroglycerin  use in the interim.  Still following with Dr. Del Favia for primary care.  We went over her medications.  She reports no obvious concerns with current regimen.  I rechecked her blood pressure today at 138/70.  I also went over her most recent lab work, LDL 65 in March.  I reviewed her ECG today which shows sinus bradycardia with increased voltage, rule out old inferior infarct pattern.  Physical Exam: VS:  BP 138/70 (BP Location: Right Arm)   Pulse (!) 56   Ht 5\' 2"  (1.575 m)   Wt 144 lb (65.3 kg)   SpO2 97%   BMI 26.34 kg/m , BMI Body mass index is 26.34 kg/m.  Wt Readings from Last 3 Encounters:  01/08/24 144 lb (65.3 kg)  09/26/22 141 lb (64 kg)  03/26/22 140 lb 3.2 oz (63.6 kg)    General: Patient appears comfortable at rest. HEENT: Conjunctiva and lids normal. Neck: Supple, no elevated JVP or carotid bruits. Lungs: Clear to auscultation, nonlabored breathing at rest. Cardiac: Regular rate and rhythm, no S3, 1/6 systolic murmur, no pericardial rub. Extremities: No pitting edema.  ECG:  An ECG dated 07/31/2020 was personally reviewed today and demonstrated:  Sinus bradycardia.  Labwork:  March 2025: Hemoglobin 12.4, platelets 249, BUN 19, creatinine 1.31, potassium 4.8, AST 14, ALT 10, cholesterol 135, triglycerides 119, HDL 49, LDL 65, hemoglobin A1c 6.2%  Other Studies Reviewed Today:  No interval cardiac testing for review today.  Assessment and Plan:  1.  CAD status post DES to the LAD in 2012 with 99% nondominant RCA managed medically.  She does  not report any angina or interval nitroglycerin  use.  I reviewed her ECG.  Continue observation on medical therapy including aspirin  81 mg daily, Zetia  10 mg daily, and as needed nitroglycerin .  2.  Primary hypertension.  Recheck blood pressure today at 138/70.  Continue Altace  5 mg daily, Toprol -XL 25 mg daily, and Norvasc  2.5 mg daily.  Keep follow-up with Dr. Del Favia.  3.  Mixed hyperlipidemia.  LDL 65 in March.  Continue Zetia  10 mg daily.  She has history of statin myalgias.  4.  History of hemorrhagic right thalamic stroke in 2012.  Disposition:  Follow up 1 year  Signed, Gerard Knight, M.D., F.A.C.C. [facility]

## 2024-01-08 NOTE — Patient Instructions (Signed)
 Medication Instructions:  Your physician recommends that you continue on your current medications as directed. Please refer to the Current Medication list given to you today.   Labwork: None today  Testing/Procedures: None today  Follow-Up: 1 year  Any Other Special Instructions Will Be Listed Below (If Applicable).  If you need a refill on your cardiac medications before your next appointment, please call your pharmacy.

## 2024-01-12 DIAGNOSIS — G4700 Insomnia, unspecified: Secondary | ICD-10-CM | POA: Diagnosis not present

## 2024-01-12 DIAGNOSIS — I10 Essential (primary) hypertension: Secondary | ICD-10-CM | POA: Diagnosis not present

## 2024-01-12 DIAGNOSIS — F330 Major depressive disorder, recurrent, mild: Secondary | ICD-10-CM | POA: Diagnosis not present

## 2024-01-12 DIAGNOSIS — N3281 Overactive bladder: Secondary | ICD-10-CM | POA: Diagnosis not present

## 2024-01-12 DIAGNOSIS — F419 Anxiety disorder, unspecified: Secondary | ICD-10-CM | POA: Diagnosis not present

## 2024-01-12 DIAGNOSIS — F0390 Unspecified dementia without behavioral disturbance: Secondary | ICD-10-CM | POA: Diagnosis not present

## 2024-01-13 DIAGNOSIS — F330 Major depressive disorder, recurrent, mild: Secondary | ICD-10-CM | POA: Diagnosis not present

## 2024-02-03 DIAGNOSIS — F330 Major depressive disorder, recurrent, mild: Secondary | ICD-10-CM | POA: Diagnosis not present

## 2024-02-09 DIAGNOSIS — E559 Vitamin D deficiency, unspecified: Secondary | ICD-10-CM | POA: Diagnosis not present

## 2024-02-09 DIAGNOSIS — K219 Gastro-esophageal reflux disease without esophagitis: Secondary | ICD-10-CM | POA: Diagnosis not present

## 2024-02-09 DIAGNOSIS — E782 Mixed hyperlipidemia: Secondary | ICD-10-CM | POA: Diagnosis not present

## 2024-02-12 DIAGNOSIS — F5101 Primary insomnia: Secondary | ICD-10-CM | POA: Diagnosis not present

## 2024-02-12 DIAGNOSIS — F419 Anxiety disorder, unspecified: Secondary | ICD-10-CM | POA: Diagnosis not present

## 2024-02-12 DIAGNOSIS — F330 Major depressive disorder, recurrent, mild: Secondary | ICD-10-CM | POA: Diagnosis not present

## 2024-02-12 DIAGNOSIS — F0390 Unspecified dementia without behavioral disturbance: Secondary | ICD-10-CM | POA: Diagnosis not present

## 2024-02-16 DIAGNOSIS — K219 Gastro-esophageal reflux disease without esophagitis: Secondary | ICD-10-CM | POA: Diagnosis not present

## 2024-02-17 DIAGNOSIS — F419 Anxiety disorder, unspecified: Secondary | ICD-10-CM | POA: Diagnosis not present

## 2024-02-17 DIAGNOSIS — F330 Major depressive disorder, recurrent, mild: Secondary | ICD-10-CM | POA: Diagnosis not present

## 2024-02-20 DIAGNOSIS — E559 Vitamin D deficiency, unspecified: Secondary | ICD-10-CM | POA: Diagnosis not present

## 2024-02-20 DIAGNOSIS — I10 Essential (primary) hypertension: Secondary | ICD-10-CM | POA: Diagnosis not present

## 2024-02-24 DIAGNOSIS — F0390 Unspecified dementia without behavioral disturbance: Secondary | ICD-10-CM | POA: Diagnosis not present

## 2024-02-24 DIAGNOSIS — I10 Essential (primary) hypertension: Secondary | ICD-10-CM | POA: Diagnosis not present

## 2024-03-02 DIAGNOSIS — F419 Anxiety disorder, unspecified: Secondary | ICD-10-CM | POA: Diagnosis not present

## 2024-03-02 DIAGNOSIS — F330 Major depressive disorder, recurrent, mild: Secondary | ICD-10-CM | POA: Diagnosis not present

## 2024-03-08 DIAGNOSIS — K219 Gastro-esophageal reflux disease without esophagitis: Secondary | ICD-10-CM | POA: Diagnosis not present

## 2024-03-08 DIAGNOSIS — I10 Essential (primary) hypertension: Secondary | ICD-10-CM | POA: Diagnosis not present

## 2024-03-08 DIAGNOSIS — N3281 Overactive bladder: Secondary | ICD-10-CM | POA: Diagnosis not present

## 2024-03-08 DIAGNOSIS — J309 Allergic rhinitis, unspecified: Secondary | ICD-10-CM | POA: Diagnosis not present

## 2024-03-09 DIAGNOSIS — F0390 Unspecified dementia without behavioral disturbance: Secondary | ICD-10-CM | POA: Diagnosis not present

## 2024-03-09 DIAGNOSIS — F330 Major depressive disorder, recurrent, mild: Secondary | ICD-10-CM | POA: Diagnosis not present

## 2024-03-09 DIAGNOSIS — F419 Anxiety disorder, unspecified: Secondary | ICD-10-CM | POA: Diagnosis not present

## 2024-03-09 DIAGNOSIS — F5101 Primary insomnia: Secondary | ICD-10-CM | POA: Diagnosis not present

## 2024-03-10 DIAGNOSIS — I10 Essential (primary) hypertension: Secondary | ICD-10-CM | POA: Diagnosis not present

## 2024-03-10 DIAGNOSIS — N183 Chronic kidney disease, stage 3 unspecified: Secondary | ICD-10-CM | POA: Diagnosis not present

## 2024-03-12 DIAGNOSIS — I129 Hypertensive chronic kidney disease with stage 1 through stage 4 chronic kidney disease, or unspecified chronic kidney disease: Secondary | ICD-10-CM | POA: Diagnosis not present

## 2024-03-12 DIAGNOSIS — E782 Mixed hyperlipidemia: Secondary | ICD-10-CM | POA: Diagnosis not present

## 2024-03-12 DIAGNOSIS — K219 Gastro-esophageal reflux disease without esophagitis: Secondary | ICD-10-CM | POA: Diagnosis not present

## 2024-03-12 DIAGNOSIS — N183 Chronic kidney disease, stage 3 unspecified: Secondary | ICD-10-CM | POA: Diagnosis not present

## 2024-03-12 DIAGNOSIS — E559 Vitamin D deficiency, unspecified: Secondary | ICD-10-CM | POA: Diagnosis not present

## 2024-03-16 DIAGNOSIS — F330 Major depressive disorder, recurrent, mild: Secondary | ICD-10-CM | POA: Diagnosis not present

## 2024-04-04 DIAGNOSIS — F419 Anxiety disorder, unspecified: Secondary | ICD-10-CM | POA: Diagnosis not present

## 2024-04-04 DIAGNOSIS — F330 Major depressive disorder, recurrent, mild: Secondary | ICD-10-CM | POA: Diagnosis not present

## 2024-04-04 DIAGNOSIS — F5101 Primary insomnia: Secondary | ICD-10-CM | POA: Diagnosis not present

## 2024-04-04 DIAGNOSIS — F0390 Unspecified dementia without behavioral disturbance: Secondary | ICD-10-CM | POA: Diagnosis not present

## 2024-04-05 DIAGNOSIS — E782 Mixed hyperlipidemia: Secondary | ICD-10-CM | POA: Diagnosis not present

## 2024-04-05 DIAGNOSIS — K219 Gastro-esophageal reflux disease without esophagitis: Secondary | ICD-10-CM | POA: Diagnosis not present

## 2024-04-05 DIAGNOSIS — G4700 Insomnia, unspecified: Secondary | ICD-10-CM | POA: Diagnosis not present

## 2024-04-06 DIAGNOSIS — F331 Major depressive disorder, recurrent, moderate: Secondary | ICD-10-CM | POA: Diagnosis not present

## 2024-04-06 DIAGNOSIS — F0390 Unspecified dementia without behavioral disturbance: Secondary | ICD-10-CM | POA: Diagnosis not present

## 2024-04-13 DIAGNOSIS — F419 Anxiety disorder, unspecified: Secondary | ICD-10-CM | POA: Diagnosis not present

## 2024-04-13 DIAGNOSIS — F330 Major depressive disorder, recurrent, mild: Secondary | ICD-10-CM | POA: Diagnosis not present

## 2024-04-23 DIAGNOSIS — F0390 Unspecified dementia without behavioral disturbance: Secondary | ICD-10-CM | POA: Diagnosis not present

## 2024-04-23 DIAGNOSIS — N183 Chronic kidney disease, stage 3 unspecified: Secondary | ICD-10-CM | POA: Diagnosis not present

## 2024-04-26 DIAGNOSIS — E559 Vitamin D deficiency, unspecified: Secondary | ICD-10-CM | POA: Diagnosis not present

## 2024-04-26 DIAGNOSIS — I10 Essential (primary) hypertension: Secondary | ICD-10-CM | POA: Diagnosis not present

## 2024-04-26 DIAGNOSIS — F331 Major depressive disorder, recurrent, moderate: Secondary | ICD-10-CM | POA: Diagnosis not present

## 2024-04-27 DIAGNOSIS — F330 Major depressive disorder, recurrent, mild: Secondary | ICD-10-CM | POA: Diagnosis not present

## 2024-04-27 DIAGNOSIS — F419 Anxiety disorder, unspecified: Secondary | ICD-10-CM | POA: Diagnosis not present

## 2024-05-04 DIAGNOSIS — F419 Anxiety disorder, unspecified: Secondary | ICD-10-CM | POA: Diagnosis not present

## 2024-05-04 DIAGNOSIS — F5101 Primary insomnia: Secondary | ICD-10-CM | POA: Diagnosis not present

## 2024-05-04 DIAGNOSIS — F331 Major depressive disorder, recurrent, moderate: Secondary | ICD-10-CM | POA: Diagnosis not present

## 2024-05-04 DIAGNOSIS — F0390 Unspecified dementia without behavioral disturbance: Secondary | ICD-10-CM | POA: Diagnosis not present

## 2024-05-11 DIAGNOSIS — F331 Major depressive disorder, recurrent, moderate: Secondary | ICD-10-CM | POA: Diagnosis not present

## 2024-05-11 DIAGNOSIS — F419 Anxiety disorder, unspecified: Secondary | ICD-10-CM | POA: Diagnosis not present

## 2024-05-14 DIAGNOSIS — R4182 Altered mental status, unspecified: Secondary | ICD-10-CM | POA: Diagnosis not present

## 2024-05-18 ENCOUNTER — Ambulatory Visit: Admitting: Adult Health

## 2024-05-18 ENCOUNTER — Encounter: Payer: Self-pay | Admitting: Adult Health

## 2024-05-18 VITALS — BP 170/61 | HR 58 | Ht 62.0 in | Wt 142.0 lb

## 2024-05-18 DIAGNOSIS — F331 Major depressive disorder, recurrent, moderate: Secondary | ICD-10-CM | POA: Diagnosis not present

## 2024-05-18 DIAGNOSIS — F0390 Unspecified dementia without behavioral disturbance: Secondary | ICD-10-CM | POA: Diagnosis not present

## 2024-05-18 DIAGNOSIS — G309 Alzheimer's disease, unspecified: Secondary | ICD-10-CM

## 2024-05-18 DIAGNOSIS — F0280 Dementia in other diseases classified elsewhere without behavioral disturbance: Secondary | ICD-10-CM | POA: Diagnosis not present

## 2024-05-18 DIAGNOSIS — F0150 Vascular dementia without behavioral disturbance: Secondary | ICD-10-CM | POA: Diagnosis not present

## 2024-05-18 MED ORDER — MEMANTINE HCL ER 28 MG PO CP24: 28.0000 mg | ORAL_CAPSULE | Freq: Every day | ORAL | 3 refills | Status: AC

## 2024-05-18 NOTE — Patient Instructions (Addendum)
 Your Plan:  Continue Namenda  XR 20mg  nightly  Please check if you are currently taking Aricept  (donepezil ) - if you are, please let me know and I will send in a updated prescription. If not, I would not recommend starting it as this medication can potentially lower your heart rate  continue routine physical and cognitive activities as well as ensuring good sleep, healthy diet and routine socialization     Follow up in 1 year or call earlier if needed      Thank you for coming to see us  at [facility]. I hope we have been able to provide you high quality care today.  You may receive a patient satisfaction survey over the next few weeks. We would appreciate your feedback and comments so that we may continue to improve ourselves and the health of our patients.

## 2024-05-18 NOTE — Progress Notes (Signed)
 [facility] [address] [place]. [place] [address] [phone]       OFFICE FOLLOW UP NOTE  [name] Date of Birth:  05-29-1934 Medical Record Number:  [id number]    Primary neurologist: Dr. Rosemarie Reason for visit: Mixed dementia, hx of stroke    SUBJECTIVE:   CHIEF COMPLAINT:  Chief Complaint  Patient presents with   Dementia    Rm 3 with daughter Arland Pt is well and stable, reports no dementia concerns. Pt resides at [facility] now.    HPI:   Update 05/18/2024 JM: Patient returns for follow-up visit after prior visit 8 months ago accompanied by her daughter. Reports gradual decline of memory since prior visit.  She is now living at [facility] since 02/2023.  She has been participating in exercise classes 3 times weekly as well as other various activities including bingo.  She also continues to do word searches throughout the day.  She does need assistance with showering but otherwise able to maintain ADLs independently, facility does assist with medications.  She ambulates with rollator walker, no recent falls.  Reports she sleeps well and has a good appetite.  No behavioral concerns.  Continues on Namenda  XR 20 mg daily.  Previously on Aricept  10 mg nightly but daughter unsure if this has been continued.  She routinely follows with PCP Dr. Shona as well as cardiology.       History provided for reference purposes only Update 09/26/2022 JM: Patient returns for 88-month follow-up accompanied by her daughter.  Reports cognition remains about the same since prior visit. Can have good days and bad days. Can have more fatigue towards the end of the day. Daughter now plans on being with her during the day and plans on trying to help her increase her daily activity as well as memory exercises. Does routienly do word searches but no other activities. Lives with her husband. She is mostly independent with ADLs, does need  assistance with IADLs. Does not drive.  Denies any behavioral concerns. Sleeps well, appetite good.  MMSE today 20/30 (prior 16/30). Remains on Namenda  XR 20 mg daily and Aricept  10 mg daily. Of note, blood pressure is elevated today which is typical when she comes to appointments, monitors at home and typically stable.  She was advised to follow-up with PCP if remains elevated.  Update 03/26/2022 Dr. Rosemarie: She returns for follow-up after last visit 88 months ago.  She is accompanied by her daughter.  She continues to have memory and cognitive difficulties which she feels are unchanged.  Patient is still living at home with her husband.  She needs some help in the shower.  She has not been cooking or driving.  She is mostly independent and is able to attend to her needs.  She does do some word searches but does not otherwise do any cognitively challenging activities.  The daughter has not noticed any delusions, hallucinations, unsafe behavior or agitation.  Patient sleeps well.  She is able to ambulate independently.  She has had no falls or injuries.  On the Mini-Mental status exam today she scored 16/30 which was actually declined from 21/30 at last visit.  She is already on maximum dose Namenda  XR 28 mg daily and Aricept  10 mg daily.  She remains on aspirin  she is tolerating well without bruising or bleeding.  Her blood pressure is usually better at home though today it is elevated in office at 166/67.  She has  had no recurrent stroke or TIA symptoms.   Update 09/26/2021 Dr. Rosemarie: She returns for follow-up after last visit 88 months ago.  She is accompanied by her son.  Patient feels she is noticing cognitive improvement after increasing the dose of Namenda  to 28 mg daily which is tolerating well without any new side effects.  She also remains on Aricept  10 mg daily which she is tolerating well as well.  Patient has been participating and doing regular sodoku.  She continues to live at home with her husband.   Short-term memory remains poor.  She does not have any delusions, hallucinations, agitation or unsafe behavior.  She ambulates with a walker.  She has had no falls or injuries.  She remains on aspirin  for stroke prevention and states her blood pressure is under good control though today it is elevated in office at 185/61.  She has had no recurrent stroke or TIA symptoms.   Update 06/05/2021 Dr. Rosemarie: She returns for follow-up after last visit 88 months ago.  She is accompanied by her daughter and son.  Patient is tolerating Namenda  10 mg twice daily well without any side effects.  Family feels her short-term memory and cognitive difficulties seem to be unchanged.  She has had no further worsening.  There has been no delusions, hallucinations or violent behavior noted though she does get agitated a lot particularly with her husband.  She is noted improvement in the vision after cataract surgery and is now been able to ambulate much better and she is careful and uses a cane.  She has had no falls or injuries.  She does not like to go out a lot.  She has no new complaints.  Her blood pressure is well controlled.  She has no stroke or TIA symptoms.   Update 03/01/2021 Dr. Rosemarie: Patient returns for follow-up after last visit 6 months ago.  She is accompanied by her son and her daughter.  She has been tolerating Aricept  well without any side effects.  He continues to haved ifficulties and poor short-term memory.  Patient has not had any improvement even after aricept  which she however seems to be tolerating well without any GI or CNS side effects.  She lives with her husband who also is having some cognitive difficulties.  Patient needs some help with the shower but otherwise is independent and uses a cane and ambulate safely.  She has had no falls.  She does get upset easily at times and is grumpy.  She has not had any violent behaviors, delusions or hallucinations.  She has been started on trazodone  at night which  helps her sleep well..  She had EEG done on 08/31/2020 which was normal.  She did a lot worse on Mini-Mental status exam today and scored 21/30 compared to last visit when she had scored 29/30   Initial visit 08/28/2020:Ms. George is a 89 year old pleasant Caucasian lady with past medical history of right thalamic hemorrhage in Apr 2012, syncope, gastroesophageal reflux disease, hypertension, diverticulitis, diabetes, gait difficulties and mild cognitive impairment.  She is seen today for consultation visit for increasing falls and memory impairment.  History is obtained from the patient and husband as well as daughter who accompany her today.  I have also personally reviewed electronic medical records and prior relevant imaging films in PACS.  Patient has had some balance difficulties ongoing since 2012 following her right thalamic hemorrhage but recently has been falling a lot.  She is unable to pinpoint exact  reason for the falls and they occur at variable intervals and circumstances.  She is also noticed some worsening of her cognitive impairment and she gets confused and disoriented at times.  She does not remember to use a cane or a walker which she both has but does not not use consistently.  She states she just forgets to use it.  She was recently admitted to [facility] on 08/01/2020 and found to have some confusion and gait imbalance which was thought to be due to metabolic issues.  She was thought to have low-grade UTI but urine cultures were negative.  MRI scan had shown moderate ventriculomegaly as well as cerebral atrophy raising question of NPH but Dr. Milton who saw the patient did not feel patient had clinical history to suggest NPH.  Patient underwent extensive work-up including vitamin B12, TSH which were negative and made gradual improvement.  She has been referred to physical therapy but she has not yet started outpatient therapy but plans to do so soon.  On Mini-Mental status  exam testing today she scored 29/30 with only minor deficits.  Patient has not been on Aricept  or Namenda  like medications but she was seen by me following the previous hemorrhage and mild cognitive impairment was diagnosed even as far back as in 2012.  There is no family Struve dementia.  She denies any recent headaches, seizures, loss of consciousness or new strokelike symptoms.  The patient has Xanax  listed as a as needed medication but states that she has been taking it every night to help her sleep.    ROS:   14 system review of systems performed and negative with exception of those listed in HPI  PMH:  Past Medical History:  Diagnosis Date   Allergic rhinitis    Cataracts, bilateral    Coronary atherosclerosis of native coronary artery    DES LAD 3/12, 99% nondominant RCA, No CAD otherwise, LVEF 65%   Dementia (HCC)    Diverticulitis    Essential hypertension    GERD (gastroesophageal reflux disease)    Hemorrhoids    Hyperlipidemia    Major depressive disorder with single episode    Metabolic encephalopathy    Stroke, hemorrhagic (HCC)    Right thalamic hemorrhage 4/12   Syncope    Neurally mediated   Type 2 diabetes mellitus with unspecified diabetic retinopathy without macular edema (HCC)     PSH:  Past Surgical History:  Procedure Laterality Date   BREAST CYST EXCISION     CARDIAC CATHETERIZATION     CAROTID STENT  11/16/10   CATARACT EXTRACTION W/PHACO Left 04/11/2021   Procedure: CATARACT EXTRACTION PHACO AND INTRAOCULAR LENS PLACEMENT (IOC);  Surgeon: Harrie Agent, MD;  Location: [facility];  Service: Ophthalmology;  Laterality: Left;  CDE   11.84   COLONOSCOPY N/A 10/02/2014   Dr. Rourk:engorged internal hemorrhoids/pancolonic diverticulosis   ESOPHAGOGASTRODUODENOSCOPY  10/02/14   Dr. Rourk:normal esophagus 2 cm hiatal hernia, gastric erosions likely from NSAID effect    Social History:  Social History   Socioeconomic History   Marital status: Married    Spouse  name: Dasie   Number of children: 2   Years of education: 12   Highest education level: Not on file  Occupational History   Occupation: Retired  Tobacco Use   Smoking status: Former    Current packs/day: 0.00    Types: Cigarettes    Quit date: 09/02/1988    Years since quitting: 35.7   Smokeless tobacco: Never  Vaping Use   Vaping status: Never Used  Substance and Sexual Activity   Alcohol  use: No    Alcohol /week: 0.0 standard drinks of alcohol    Drug use: No   Sexual activity: Not Currently  Other Topics Concern   Not on file  Social History Narrative   Lives with husband   Right handed   Drinks 1-2 cups caffeine daily   Social Drivers of Corporate investment banker Strain: Not on file  Food Insecurity: Not on file  Transportation Needs: Not on file  Physical Activity: Not on file  Stress: Not on file  Social Connections: Not on file  Intimate Partner Violence: Not on file    Family History:  Family History  Problem Relation Age of Onset   Pancreatic cancer Mother        Died at age 61   Diabetes Mother    Cancer Mother    Coronary artery disease Father        Died in his 38s   Heart attack Father    Hypertension Father    Breast cancer Sister    Cancer Sister     Medications:   Current Outpatient Medications on File Prior to Visit  Medication Sig Dispense Refill   acetaminophen  (TYLENOL ) 325 MG tablet Take 2 tablets (650 mg total) by mouth every 4 (four) hours as needed for mild pain, fever or headache (or Fever >/= 101). 12 tablet 1   ALPRAZolam  (XANAX ) 0.5 MG tablet Take 0.25 mg by mouth daily as needed for anxiety.     alum & mag hydroxide-simeth (MYLANTA) 200-200-20 MG/5ML suspension Take 15 mLs by mouth every 8 (eight) hours. (Patient taking differently: Take 15 mLs by mouth every 6 (six) hours as needed.) 355 mL 0   amLODipine  (NORVASC ) 2.5 MG tablet Take 1 tablet (2.5 mg total) by mouth daily. 90 tablet 1   Artificial Tear Solution (SOOTHE XP OP)  Place 1 drop into both eyes daily as needed (dry eyes).     aspirin  81 MG tablet Take 1 tablet (81 mg total) by mouth daily with breakfast. 30 tablet 5   diclofenac  Sodium (VOLTAREN ) 1 % GEL Apply 1 application  topically 4 (four) times daily as needed (pain).     donepezil  (ARICEPT ) 10 MG tablet Take 1 tablet (10 mg total) by mouth at bedtime. 90 tablet 3   escitalopram  (LEXAPRO ) 20 MG tablet Take 1.5 tablets (30 mg total) by mouth daily. 45 tablet 3   ezetimibe  (ZETIA ) 10 MG tablet TAKE ONE TABLET BY MOUTH DAILY. 90 tablet 0   fluticasone (FLONASE) 50 MCG/ACT nasal spray Place 1 spray into both nostrils daily as needed for allergies or rhinitis.     fluvastatin  (LESCOL ) 40 MG capsule TAKE 1 CAPSULE BY MOUTH ONCE DAILY. 90 capsule 1   levocetirizine (XYZAL) 5 MG tablet Take 5 mg by mouth daily as needed for allergies.     Melatonin 10 MG TABS Take 10 mg by mouth daily after supper. 1 tablet 1   memantine  (NAMENDA  XR) 28 MG CP24 24 hr capsule Take 1 capsule (28 mg total) by mouth daily. 90 capsule 3   metoprolol  succinate (TOPROL -XL) 25 MG 24 hr tablet TAKE (1) TABLET BY MOUTH AT BEDTIME. NEEDS OFFICE VISIT 90 tablet 1   naphazoline-glycerin  (CLEAR EYES REDNESS) 0.012-0.2 % SOLN Place 2 drops into both eyes 4 (four) times daily as needed for eye irritation. 15 mL 1   nitroGLYCERIN  (NITROSTAT ) 0.4 MG SL tablet Dissolve  1 tablet under tongue every 5 mins up to 3 dose in 15 mins for chest pain. If no relief call 911. 25 tablet 0   omeprazole (PRILOSEC) 20 MG capsule Take 20 mg by mouth daily.     polyethylene glycol (MIRALAX  / GLYCOLAX ) 17 g packet Take 17 g by mouth daily. 30 each 2   Probiotic Product (PROBIOTIC DAILY PO) Take 1 capsule by mouth daily as needed (stomach issues).     ramipril  (ALTACE ) 5 MG capsule Take 1 capsule (5 mg total) by mouth daily. 90 capsule 0   traZODone  (DESYREL ) 50 MG tablet Take 1 tab for 7 days, then increase to 2 tab p.o. qhs for insomnia. 60 tablet 0   UNABLE TO  FIND Diet: NAS     Vibegron (GEMTESA) 75 MG TABS Take 75 mg by mouth daily.     No current facility-administered medications on file prior to visit.    Allergies:   Allergies  Allergen Reactions   Macrodantin     Unknown   Penicillins     Unknown reaction   Pravachol  [Pravastatin  Sodium]     Unknown   Tetanus Toxoid-Containing Vaccines     Unknown       OBJECTIVE:  Physical Exam  Vitals:   05/18/24 1329  BP: (!) 170/61  Pulse: (!) 58  Weight: 142 lb (64.4 kg)  Height: 5' 2 (1.575 m)   Body mass index is 25.97 kg/m. No results found.  General: well developed, well nourished, very pleasant elderly Caucasian female, seated, in no evident distress Head: head normocephalic and atraumatic.   Neck: supple with no carotid or supraclavicular bruits Cardiovascular: regular rate and rhythm, no murmurs Musculoskeletal: no deformity Skin:  no rash/petichiae Vascular:  Normal pulses all extremities   Neurologic Exam Mental Status: Awake and fully alert. Oriented to place and disoriented to time. Recent memory impaired and remote memory intact. Attention span, concentration and fund of knowledge mildly impaired during visit with daughter supplementing history. Mood and affect appropriate.  Cranial Nerves: Pupils equal, briskly reactive to light. Extraocular movements full without nystagmus. Visual fields full to confrontation. Hearing intact. Facial sensation intact. Face, tongue, palate moves normally and symmetrically.  Motor: Normal bulk and tone. Normal strength in all tested extremity muscles Sensory.: intact to touch , pinprick , position and vibratory sensation.  Coordination: Rapid alternating movements normal in all extremities. Finger-to-nose and heel-to-shin performed accurately bilaterally. Gait and Station: Arises from chair without difficulty. Stance is normal. Gait demonstrates normal stride length and balance with use of rollator walker. Tandem walk and heel toe  not attempted Reflexes: 1+ and symmetric. Toes downgoing.      05/18/2024    1:32 PM 09/26/2022    1:39 PM 03/26/2022    2:51 PM  MMSE - Mini Mental State Exam  Orientation to time 0 2 1  Orientation to Place 4 5 5   Registration 3 3 3   Attention/ Calculation 0 0 0  Recall 1 2 0  Language- name 2 objects 2 2 2   Language- repeat 1 1 1   Language- follow 3 step command 3 2 2   Language- read & follow direction 1 1 1   Write a sentence 1 1 1   Copy design 0 1 0  Total score 16 20 16          ASSESSMENT/PLAN: Cherrell  PHILOMENA BUTTERMORE is a 89 y.o. year old female with history of right thalamic stroke in 2012 and cognitive impairment with progression to early dementia likely  mixed vascular and Alzheimer's   1.  Mixed dementia  -MMSE today 16/30 (prior 20/30 09/2022)   -Continue Namenda  XR 28 mg daily -refill provided  -advised to check med list at ALF - if currently on Aricept , will place order for refill. If not currently taking, would not recommend restarting due to bradycardia.   -Discussed importance of routine participation in cognitively challenging activities as well as memory compensation strategies and routine physical activity     Follow up in 1 year or call earlier if needed   CC:  PCP: Shona Norleen PEDLAR, MD    I personally spent a total of 30 minutes in the care of the patient today including preparing to see the patient, performing a medically appropriate exam/evaluation, counseling and educating, placing orders, and documenting clinical information in the EHR.    Harlene Bogaert, AGNP-BC  [facility] [address] [place], [place] [address]  Phone [phone] Fax [phone] Note: This document was prepared with digital dictation and possible smart phrase technology. Any transcriptional errors that result from this process are unintentional.

## 2024-05-24 DIAGNOSIS — E1165 Type 2 diabetes mellitus with hyperglycemia: Secondary | ICD-10-CM | POA: Diagnosis not present

## 2024-05-24 DIAGNOSIS — E782 Mixed hyperlipidemia: Secondary | ICD-10-CM | POA: Diagnosis not present

## 2024-05-24 DIAGNOSIS — K219 Gastro-esophageal reflux disease without esophagitis: Secondary | ICD-10-CM | POA: Diagnosis not present

## 2024-05-24 DIAGNOSIS — I209 Angina pectoris, unspecified: Secondary | ICD-10-CM | POA: Diagnosis not present

## 2024-05-25 DIAGNOSIS — F419 Anxiety disorder, unspecified: Secondary | ICD-10-CM | POA: Diagnosis not present

## 2024-05-25 DIAGNOSIS — F331 Major depressive disorder, recurrent, moderate: Secondary | ICD-10-CM | POA: Diagnosis not present

## 2024-05-31 DIAGNOSIS — I10 Essential (primary) hypertension: Secondary | ICD-10-CM | POA: Diagnosis not present

## 2024-05-31 DIAGNOSIS — F0390 Unspecified dementia without behavioral disturbance: Secondary | ICD-10-CM | POA: Diagnosis not present

## 2024-06-02 DIAGNOSIS — F5101 Primary insomnia: Secondary | ICD-10-CM | POA: Diagnosis not present

## 2024-06-02 DIAGNOSIS — F419 Anxiety disorder, unspecified: Secondary | ICD-10-CM | POA: Diagnosis not present

## 2024-06-02 DIAGNOSIS — F0390 Unspecified dementia without behavioral disturbance: Secondary | ICD-10-CM | POA: Diagnosis not present

## 2024-06-02 DIAGNOSIS — F331 Major depressive disorder, recurrent, moderate: Secondary | ICD-10-CM | POA: Diagnosis not present

## 2024-06-08 DIAGNOSIS — E782 Mixed hyperlipidemia: Secondary | ICD-10-CM | POA: Diagnosis not present

## 2024-06-08 DIAGNOSIS — Z23 Encounter for immunization: Secondary | ICD-10-CM | POA: Diagnosis not present

## 2024-06-08 DIAGNOSIS — Z955 Presence of coronary angioplasty implant and graft: Secondary | ICD-10-CM | POA: Diagnosis not present

## 2024-06-08 DIAGNOSIS — R69 Illness, unspecified: Secondary | ICD-10-CM | POA: Diagnosis not present

## 2024-06-08 DIAGNOSIS — Z0000 Encounter for general adult medical examination without abnormal findings: Secondary | ICD-10-CM | POA: Diagnosis not present

## 2024-06-08 DIAGNOSIS — X32XXXD Exposure to sunlight, subsequent encounter: Secondary | ICD-10-CM | POA: Diagnosis not present

## 2024-06-08 DIAGNOSIS — R809 Proteinuria, unspecified: Secondary | ICD-10-CM | POA: Diagnosis not present

## 2024-06-08 DIAGNOSIS — F331 Major depressive disorder, recurrent, moderate: Secondary | ICD-10-CM | POA: Diagnosis not present

## 2024-06-08 DIAGNOSIS — N1831 Chronic kidney disease, stage 3a: Secondary | ICD-10-CM | POA: Diagnosis not present

## 2024-06-08 DIAGNOSIS — F419 Anxiety disorder, unspecified: Secondary | ICD-10-CM | POA: Diagnosis not present

## 2024-06-08 DIAGNOSIS — E1165 Type 2 diabetes mellitus with hyperglycemia: Secondary | ICD-10-CM | POA: Diagnosis not present

## 2024-06-08 DIAGNOSIS — I10 Essential (primary) hypertension: Secondary | ICD-10-CM | POA: Diagnosis not present

## 2024-06-08 DIAGNOSIS — L570 Actinic keratosis: Secondary | ICD-10-CM | POA: Diagnosis not present

## 2024-06-14 DIAGNOSIS — N390 Urinary tract infection, site not specified: Secondary | ICD-10-CM | POA: Diagnosis not present

## 2024-06-18 DIAGNOSIS — N390 Urinary tract infection, site not specified: Secondary | ICD-10-CM | POA: Diagnosis not present

## 2024-06-21 DIAGNOSIS — F331 Major depressive disorder, recurrent, moderate: Secondary | ICD-10-CM | POA: Diagnosis not present

## 2024-06-21 DIAGNOSIS — N3281 Overactive bladder: Secondary | ICD-10-CM | POA: Diagnosis not present

## 2024-06-21 DIAGNOSIS — I10 Essential (primary) hypertension: Secondary | ICD-10-CM | POA: Diagnosis not present

## 2024-06-29 DIAGNOSIS — F0390 Unspecified dementia without behavioral disturbance: Secondary | ICD-10-CM | POA: Diagnosis not present

## 2024-06-30 DIAGNOSIS — I10 Essential (primary) hypertension: Secondary | ICD-10-CM | POA: Diagnosis not present

## 2024-06-30 DIAGNOSIS — F0390 Unspecified dementia without behavioral disturbance: Secondary | ICD-10-CM | POA: Diagnosis not present

## 2024-07-01 DIAGNOSIS — F5101 Primary insomnia: Secondary | ICD-10-CM | POA: Diagnosis not present

## 2024-07-01 DIAGNOSIS — F0390 Unspecified dementia without behavioral disturbance: Secondary | ICD-10-CM | POA: Diagnosis not present

## 2024-07-01 DIAGNOSIS — F419 Anxiety disorder, unspecified: Secondary | ICD-10-CM | POA: Diagnosis not present

## 2024-07-01 DIAGNOSIS — F331 Major depressive disorder, recurrent, moderate: Secondary | ICD-10-CM | POA: Diagnosis not present

## 2024-07-06 DIAGNOSIS — C44622 Squamous cell carcinoma of skin of right upper limb, including shoulder: Secondary | ICD-10-CM | POA: Diagnosis not present

## 2024-07-07 DIAGNOSIS — R140 Abdominal distension (gaseous): Secondary | ICD-10-CM | POA: Diagnosis not present

## 2024-07-12 DIAGNOSIS — E782 Mixed hyperlipidemia: Secondary | ICD-10-CM | POA: Diagnosis not present

## 2024-07-12 DIAGNOSIS — K219 Gastro-esophageal reflux disease without esophagitis: Secondary | ICD-10-CM | POA: Diagnosis not present

## 2024-07-12 DIAGNOSIS — J309 Allergic rhinitis, unspecified: Secondary | ICD-10-CM | POA: Diagnosis not present

## 2024-07-13 DIAGNOSIS — F419 Anxiety disorder, unspecified: Secondary | ICD-10-CM | POA: Diagnosis not present

## 2024-07-27 DIAGNOSIS — F331 Major depressive disorder, recurrent, moderate: Secondary | ICD-10-CM | POA: Diagnosis not present

## 2024-07-27 DIAGNOSIS — F5101 Primary insomnia: Secondary | ICD-10-CM | POA: Diagnosis not present

## 2024-07-27 DIAGNOSIS — F0390 Unspecified dementia without behavioral disturbance: Secondary | ICD-10-CM | POA: Diagnosis not present

## 2024-07-27 DIAGNOSIS — F419 Anxiety disorder, unspecified: Secondary | ICD-10-CM | POA: Diagnosis not present

## 2025-05-23 ENCOUNTER — Ambulatory Visit: Admitting: Adult Health
